# Patient Record
Sex: Male | Born: 1960 | Race: White | Hispanic: No | Marital: Married | Smoking: Former smoker
Health system: Southern US, Community
[De-identification: ages and names within clinical notes are randomized; demographics above are authoritative.]

## PROBLEM LIST (undated history)

## (undated) DIAGNOSIS — I10 Essential (primary) hypertension: Secondary | ICD-10-CM

## (undated) DIAGNOSIS — M1990 Unspecified osteoarthritis, unspecified site: Secondary | ICD-10-CM

## (undated) DIAGNOSIS — S5291XA Unspecified fracture of right forearm, initial encounter for closed fracture: Secondary | ICD-10-CM

## (undated) DIAGNOSIS — S82892A Other fracture of left lower leg, initial encounter for closed fracture: Secondary | ICD-10-CM

## (undated) DIAGNOSIS — S82891A Other fracture of right lower leg, initial encounter for closed fracture: Secondary | ICD-10-CM

## (undated) DIAGNOSIS — R011 Cardiac murmur, unspecified: Secondary | ICD-10-CM

## (undated) DIAGNOSIS — Z720 Tobacco use: Secondary | ICD-10-CM

## (undated) DIAGNOSIS — I2510 Atherosclerotic heart disease of native coronary artery without angina pectoris: Secondary | ICD-10-CM

## (undated) DIAGNOSIS — E785 Hyperlipidemia, unspecified: Secondary | ICD-10-CM

## (undated) HISTORY — DX: Other fracture of left lower leg, initial encounter for closed fracture: S82.892A

## (undated) HISTORY — DX: Other fracture of right lower leg, initial encounter for closed fracture: S82.891A

## (undated) HISTORY — PX: CARDIAC CATHETERIZATION: SHX172

## (undated) HISTORY — DX: Hyperlipidemia, unspecified: E78.5

## (undated) HISTORY — DX: Tobacco use: Z72.0

## (undated) HISTORY — DX: Cardiac murmur, unspecified: R01.1

## (undated) HISTORY — DX: Essential (primary) hypertension: I10

## (undated) HISTORY — DX: Unspecified osteoarthritis, unspecified site: M19.90

## (undated) HISTORY — DX: Unspecified fracture of right forearm, initial encounter for closed fracture: S52.91XA

## (undated) HISTORY — DX: Atherosclerotic heart disease of native coronary artery without angina pectoris: I25.10

---

## 2009-03-13 ENCOUNTER — Ambulatory Visit: Payer: Self-pay | Admitting: Family Medicine

## 2009-03-13 DIAGNOSIS — F17200 Nicotine dependence, unspecified, uncomplicated: Secondary | ICD-10-CM | POA: Insufficient documentation

## 2009-03-14 DIAGNOSIS — I10 Essential (primary) hypertension: Secondary | ICD-10-CM | POA: Insufficient documentation

## 2009-03-14 DIAGNOSIS — M1990 Unspecified osteoarthritis, unspecified site: Secondary | ICD-10-CM | POA: Insufficient documentation

## 2009-03-16 LAB — CONVERTED CEMR LAB
Albumin: 3.8 g/dL (ref 3.5–5.2)
Basophils Absolute: 0 10*3/uL (ref 0.0–0.1)
CO2: 29 meq/L (ref 19–32)
Calcium: 9.1 mg/dL (ref 8.4–10.5)
Cholesterol: 236 mg/dL — ABNORMAL HIGH (ref 0–200)
Direct LDL: 176 mg/dL
Eosinophils Absolute: 0.2 10*3/uL (ref 0.0–0.7)
Glucose, Bld: 173 mg/dL — ABNORMAL HIGH (ref 70–99)
HCT: 48.3 % (ref 39.0–52.0)
Hemoglobin: 16.5 g/dL (ref 13.0–17.0)
Lymphocytes Relative: 35 % (ref 12.0–46.0)
Lymphs Abs: 3.5 10*3/uL (ref 0.7–4.0)
MCHC: 34.1 g/dL (ref 30.0–36.0)
Neutro Abs: 5.5 10*3/uL (ref 1.4–7.7)
PSA: 0.25 ng/mL (ref 0.10–4.00)
RDW: 12 % (ref 11.5–14.6)
Sodium: 139 meq/L (ref 135–145)
Triglycerides: 208 mg/dL — ABNORMAL HIGH (ref 0.0–149.0)

## 2009-04-10 ENCOUNTER — Ambulatory Visit: Payer: Self-pay | Admitting: Family Medicine

## 2009-04-10 DIAGNOSIS — E1169 Type 2 diabetes mellitus with other specified complication: Secondary | ICD-10-CM | POA: Insufficient documentation

## 2009-04-10 DIAGNOSIS — E785 Hyperlipidemia, unspecified: Secondary | ICD-10-CM

## 2009-04-10 LAB — CONVERTED CEMR LAB
BUN: 10 mg/dL (ref 6–23)
Calcium: 9.2 mg/dL (ref 8.4–10.5)
Cholesterol, target level: 200 mg/dL
GFR calc non Af Amer: 95.33 mL/min (ref >60)
Glucose, Bld: 165 mg/dL — ABNORMAL HIGH (ref 70–99)
LDL Goal: 130 mg/dL

## 2009-04-22 ENCOUNTER — Ambulatory Visit: Payer: Self-pay | Admitting: Family Medicine

## 2009-04-22 DIAGNOSIS — E119 Type 2 diabetes mellitus without complications: Secondary | ICD-10-CM | POA: Insufficient documentation

## 2009-05-25 ENCOUNTER — Ambulatory Visit: Payer: Self-pay | Admitting: Family Medicine

## 2009-06-10 ENCOUNTER — Encounter: Payer: Self-pay | Admitting: Family Medicine

## 2009-08-21 ENCOUNTER — Ambulatory Visit: Payer: Self-pay | Admitting: Family Medicine

## 2009-08-23 LAB — CONVERTED CEMR LAB
Albumin: 3.5 g/dL (ref 3.5–5.2)
Alkaline Phosphatase: 68 units/L (ref 39–117)
Bilirubin, Direct: 0.1 mg/dL (ref 0.0–0.3)
LDL Cholesterol: 107 mg/dL — ABNORMAL HIGH (ref 0–99)
Total CHOL/HDL Ratio: 4

## 2009-08-26 ENCOUNTER — Ambulatory Visit: Payer: Self-pay | Admitting: Family Medicine

## 2010-03-22 ENCOUNTER — Ambulatory Visit: Payer: Self-pay | Admitting: Family Medicine

## 2010-03-23 LAB — CONVERTED CEMR LAB
ALT: 28 units/L (ref 0–53)
AST: 27 units/L (ref 0–37)
Alkaline Phosphatase: 69 units/L (ref 39–117)
BUN: 14 mg/dL (ref 6–23)
Chloride: 97 meq/L (ref 96–112)
Cholesterol: 206 mg/dL — ABNORMAL HIGH (ref 0–200)
Creatinine,U: 64.6 mg/dL
Glucose, Bld: 114 mg/dL — ABNORMAL HIGH (ref 70–99)
Microalb Creat Ratio: 1.5 mg/g (ref 0.0–30.0)
Microalb, Ur: 1 mg/dL (ref 0.0–1.9)
Potassium: 4.9 meq/L (ref 3.5–5.1)
Sodium: 139 meq/L (ref 135–145)
Total Bilirubin: 0.4 mg/dL (ref 0.3–1.2)
Total CHOL/HDL Ratio: 4

## 2010-04-01 ENCOUNTER — Ambulatory Visit: Payer: Self-pay | Admitting: Family Medicine

## 2010-09-14 NOTE — Assessment & Plan Note (Signed)
Summary: ROA 3 [facility] F/U ON LABS CYD   Vital Signs:  Patient profile:   50 year old male Height:      72 inches Weight:      215.0 pounds BMI:     29.26 Temp:     98.0 degrees F oral Pulse rate:   70 / minute Pulse rhythm:   regular BP sitting:   120 / 80  (left arm) Cuff size:   regular  Vitals Entered By: Benny Lennert CMA Duncan Dull) (August 26, 2009 3:33 PM)  History of Present Illness: Chief complaint 3 month follow up  DM: sugars improved, compliant with diet  HTN: stable, at goal  Lipids: improved on zocor, has not been eating that great with regards to fat and no exercise.  Smoker, wants to quit tobacco  Hypertension History:      He denies headache, chest pain, palpitations, dyspnea with exertion, orthopnea, PND, peripheral edema, visual symptoms, neurologic problems, syncope, and side effects from treatment.  He notes no problems with any antihypertensive medication side effects.        Positive major cardiovascular risk factors include male age 53 years old or older, diabetes, hyperlipidemia, hypertension, and current tobacco user.        Further assessment for target organ damage reveals no history of ASHD, cardiac end-organ damage (CHF/LVH), stroke/TIA, peripheral vascular disease, renal insufficiency, or hypertensive retinopathy.    Lipid Management History:      Positive NCEP/ATP III risk factors include male age 51 years old or older, diabetes, current tobacco user, and hypertension.  Negative NCEP/ATP III risk factors include no ASHD (atherosclerotic heart disease), no prior stroke/TIA, no peripheral vascular disease, and no history of aortic aneurysm.        The patient states that he knows about the "Therapeutic Lifestyle Change" diet.  His compliance with the TLC diet is good.  The patient expresses understanding of adjunctive measures for cholesterol lowering.  Adjunctive measures started by the patient include aerobic exercise, fiber, ASA, omega-3 supplements,  limit alcohol consumpton, and weight reduction.  He expresses no side effects from his lipid-lowering medication.    Clinical Review Panels:  Prevention   Last PSA:  0.25 (03/13/2009)  Lipid Management   Cholesterol:  171 (08/21/2009)   LDL (bad choesterol):  107 (08/21/2009)   HDL (good cholesterol):  41.60 (08/21/2009)  Diabetes Management   HgBA1C:  6.8 (08/21/2009)   Creatinine:  0.9 (04/10/2009)  CBC   WBC:  10.0 (03/13/2009)   RBC:  5.29 (03/13/2009)   Hgb:  16.5 (03/13/2009)   Hct:  48.3 (03/13/2009)   Platelets:  139.0 (03/13/2009)   MCV  91.2 (03/13/2009)   MCHC  34.1 (03/13/2009)   RDW  12.0 (03/13/2009)   PMN:  54.9 (03/13/2009)   Lymphs:  35.0 (03/13/2009)   Monos:  8.1 (03/13/2009)   Eosinophils:  1.6 (03/13/2009)   Basophil:  0.4 (03/13/2009)  Complete Metabolic Panel   Glucose:  165 (04/10/2009)   Sodium:  138 (04/10/2009)   Potassium:  4.3 (04/10/2009)   Chloride:  102 (04/10/2009)   CO2:  32 (04/10/2009)   BUN:  10 (04/10/2009)   Creatinine:  0.9 (04/10/2009)   Albumin:  3.5 (08/21/2009)   Total Protein:  7.9 (08/21/2009)   Calcium:  9.2 (04/10/2009)   Total Bili:  0.8 (08/21/2009)   Alk Phos:  68 (08/21/2009)   SGPT (ALT):  35 (08/21/2009)   SGOT (AST):  30 (08/21/2009)   Allergies (verified): No Known Drug Allergies  Past History:  Past medical, surgical, family and social histories (including risk factors) reviewed, and no changes noted (except as noted below).  Past Medical History: Reviewed history from 04/22/2009 and no changes required. Hypertension Osteoarthritis Tobacco abuse Hyperlipidemia Diabetes mellitus, type II  Past Surgical History: Reviewed history from 03/13/2009 and no changes required. none  Family History: Reviewed history from 03/13/2009 and no changes required. Adopted  Social History: Reviewed history from 03/13/2009 and no changes required. Occupation: Occupational hygienist, Merchandiser, retail Married Current  Smoker Alcohol use-yes Drug use-no Regular exercise-no  Review of Systems       ROS: GEN: No acute illnesses, no fevers, chills, sweats, fatigue, weight loss, or URI sx. GI: No n/v/d Pulm: No SOB, cough, wheezing Interactive and getting along well at home.  Otherwise, ROS is as per the HPI.   Physical Exam  Additional Exam:  GEN: WDWN, NAD, Non-toxic, A & O x 3 HEENT: Atraumatic, Normocephalic. Neck supple. No masses, No LAD. Ears and Nose: No external deformity. CV: RRR, No M/G/R. No JVD. No thrill. No extra heart sounds. PULM: CTA B, no wheezes, crackles, rhonchi. No retractions. No resp. distress. No accessory muscle use. EXTR: No c/c/e NEURO: Normal gait.  PSYCH: Normally interactive. Conversant. Not depressed or anxious appearing.  Calm demeanor.     Impression & Recommendations:  Problem # 1:  DIABETES MELLITUS, TYPE II (ICD-250.00) Assessment Improved  His updated medication list for this problem includes:    Lisinopril-hydrochlorothiazide 20-12.5 Mg Tabs (Lisinopril-hydrochlorothiazide) .Marland Kitchen... 1 tablet by mouth daily    Glucophage Xr 500 Mg Xr24h-tab (Metformin hcl) .Marland Kitchen... 1 daily by mouth  Labs Reviewed: Creat: 0.9 (04/10/2009)    Reviewed HgBA1c results: 6.8 (08/21/2009)  7.4 (04/10/2009)  Problem # 2:  HYPERLIPIDEMIA (ICD-272.4) Assessment: Improved  improved, not at goal will work a great deal on losing weight, diet, exercise, recheck 6 months  His updated medication list for this problem includes:    Simvastatin 40 Mg Tabs (Simvastatin) .Marland Kitchen... 1 by mouth at bedtime  Labs Reviewed: SGOT: 30 (08/21/2009)   SGPT: 35 (08/21/2009)  Lipid Goals: Chol Goal: 200 (04/10/2009)   HDL Goal: 40 (04/10/2009)   LDL Goal: 100 (04/22/2009)   TG Goal: 150 (04/10/2009)  10 Yr Risk Heart Disease: 18 % Prior 10 Yr Risk Heart Disease: 27 % (04/22/2009)   HDL:41.60 (08/21/2009), 34.60 (03/13/2009)  LDL:107 (08/21/2009), 176 (03/13/2009)  Chol:171 (08/21/2009), 236  (03/13/2009)  Trig:114.0 (08/21/2009), 208.0 (03/13/2009)  Problem # 3:  HYPERTENSION (ICD-401.9) Assessment: Improved  His updated medication list for this problem includes:    Lisinopril-hydrochlorothiazide 20-12.5 Mg Tabs (Lisinopril-hydrochlorothiazide) .Marland Kitchen... 1 tablet by mouth daily  BP today: 120/80 Prior BP: 118/70 (05/25/2009)  10 Yr Risk Heart Disease: 18 % Prior 10 Yr Risk Heart Disease: 27 % (04/22/2009)  Labs Reviewed: K+: 4.3 (04/10/2009) Creat: : 0.9 (04/10/2009)   Chol: 171 (08/21/2009)   HDL: 41.60 (08/21/2009)   LDL: 107 (08/21/2009)   TG: 114.0 (08/21/2009)  Problem # 4:  TOBACCO USE (ICD-305.1)  The patient does smoke cigarettes, and we discussed that tobacco is harmful to one's overall health, and that likely quitting smoking would be the single best thing that they could do for their health.   His updated medication list for this problem includes:    Chantix Starting Month Pak 0.5 Mg X 11 & 1 Mg X 42 Misc (Varenicline tartrate) .Marland Kitchen... 0.5mg  by mouth once daily for 3 days, then twice daily for 4 days and then 1mg  by mouth 2 times daily  Chantix 1 Mg Tabs (Varenicline tartrate) .Marland Kitchen... 1 tab by mouth 2 times daily  Encouraged smoking cessation and discussed different methods for smoking cessation.   Orders: Tobacco use cessation intermediate 3-10 minutes (99406)  Complete Medication List: 1)  Prilosec Otc 20 Mg Tbec (Omeprazole magnesium) .... Once a day 2)  Lisinopril-hydrochlorothiazide 20-12.5 Mg Tabs (Lisinopril-hydrochlorothiazide) .Marland Kitchen.. 1 tablet by mouth daily 3)  Simvastatin 40 Mg Tabs (Simvastatin) .Marland Kitchen.. 1 by mouth at bedtime 4)  Glucophage Xr 500 Mg Xr24h-tab (Metformin hcl) .Marland Kitchen.. 1 daily by mouth 5)  Lancets For Blood Glucose Machine, 250.00  .... Check bs two times a day 6)  Strips For Blood Glucose Machine, 250.00  .... Check blood sugar two times a day 7)  Chantix Starting Month Pak 0.5 Mg X 11 & 1 Mg X 42 Misc (Varenicline tartrate) .... 0.5mg  by  mouth once daily for 3 days, then twice daily for 4 days and then 1mg  by mouth 2 times daily 8)  Chantix 1 Mg Tabs (Varenicline tartrate) .Marland Kitchen.. 1 tab by mouth 2 times daily  Hypertension Assessment/Plan:      The patient's hypertensive risk group is category C: Target organ damage and/or diabetes.  His calculated 10 year risk of coronary heart disease is 18 %.  Today's blood pressure is 120/80.  His blood pressure goal is < 130/80.  Lipid Assessment/Plan:      The patient's calculated 10 year coronary heart disease risk is 18 %.  Based on NCEP/ATP III, the patient's risk factor category is "history of diabetes".  From this information, the patient's calculated lipid goals are as follows: Total cholesterol goal is 200; LDL cholesterol goal is 100; HDL cholesterol goal is 40; Triglyceride goal is 150.  His LDL cholesterol goal has not been met.    Patient Instructions: 1)  f/u 6 months 2)  Prior to next visit: 3)  BMP prior to visit, ICD-9: 250.00 4)  Hepatic Panel prior to visit ICD-9: 250.00 5)  HgbA1c: 250.00 6)  Lipid panel prior to visit ICD-9 : 250.00 7)  Urine Microalbumin prior to visit ICD-9 : 250.00 Prescriptions: GLUCOPHAGE XR 500 MG XR24H-TAB (METFORMIN HCL) 1 daily by mouth  #90 x 3   Entered by:   Benny Lennert CMA (AAMA)   Authorized by:   Hannah Beat MD   Signed by:   Benny Lennert CMA (AAMA) on 08/26/2009   Method used:   Electronically to        [facility]  [address] [phone]* (retail)       [address]       [place], [place]  [address]       Ph: [phone]       Fax: [phone]   RxID:   [id number] SIMVASTATIN 40 MG TABS (SIMVASTATIN) 1 by mouth at bedtime  #90 x 3   Entered by:   Benny Lennert CMA (AAMA)   Authorized by:   Hannah Beat MD   Signed by:   Benny Lennert CMA (AAMA) on 08/26/2009   Method used:   Electronically to        [facility]  [address] [phone] (retail)       [address]       [place], [place]  [address]       Ph: [phone]       Fax: [phone]   RxID:   [id number] LISINOPRIL-HYDROCHLOROTHIAZIDE  20-12.5 MG TABS (LISINOPRIL-HYDROCHLOROTHIAZIDE) 1 tablet by mouth daily  #90 x 3   Entered by:   Benny Lennert CMA (AAMA)   Authorized by:   Hannah Beat MD   Signed by:   Benny Lennert CMA (AAMA) on 08/26/2009   Method used:   Electronically to        [facility]  [address] [phone]* (retail)       [address]       [place], [place]  [address]       Ph: [phone]       Fax: [phone]   RxID:   [id number] CHANTIX 1 MG TABS (VARENICLINE TARTRATE) 1 tab by mouth 2 times daily  #60 x 3   Entered and Authorized by:   Hannah Beat MD   Signed by:   Hannah Beat MD on 08/26/2009   Method used:   Print then Give to Patient   RxID:   [id number] CHANTIX STARTING MONTH PAK 0.5 MG X 11 & 1 MG X 42  MISC (VARENICLINE TARTRATE) 0.5mg  by mouth once daily for 3 days, then twice daily for 4 days and then 1mg  by mouth 2 times daily  #1 pack x 0   Entered and Authorized by:   Hannah Beat MD   Signed by:   Hannah Beat MD on 08/26/2009   Method used:   Print then Give to Patient   RxID:   [id number]   Current Allergies (reviewed today): No known allergies

## 2010-09-14 NOTE — Assessment & Plan Note (Signed)
Summary: 6 month follow up/rbh   Vital Signs:  Patient profile:   50 year old male Height:      72 inches Weight:      208.0 pounds BMI:     28.31 Temp:     97.7 degrees F oral Pulse rate:   70 / minute Pulse rhythm:   regular BP sitting:   112 / 80  (left arm) Cuff size:   regular  Vitals Entered By: Benny Lennert CMA Duncan Dull) (April 01, 2010 9:18 AM)  History of Present Illness: Chief complaint 6 month follow up  DM: blood sugars are doing great, hemoglobin A1c is 6.1  HTN: at goal, compliant with medications  Lipids: not at goal, mostly compliant, 80-90 % taking Zocor  tobacco: Continues to smoke   REVIEW OF SYSTEMS GEN: No acute illnesses, no fever, chills, sweats. CV: No chest pain or SOB GI: No noted N or V Otherwise, pertinent positives and negatives are noted in the HPI.   GEN: WDWN, NAD, Non-toxic, A & O x 3 HEENT: Atraumatic, Normocephalic. Neck supple. No masses, No LAD. Ears and Nose: No external deformity. CV: RRR, No M/G/R. No JVD. No thrill. No extra heart sounds. PULM: CTA B, no wheezes, crackles, rhonchi. No retractions. No resp. distress. No accessory muscle use. EXTR: No c/c/e NEURO: Normal gait.  PSYCH: Normally interactive. Conversant. Not depressed or anxious appearing.  Calm demeanor.    Allergies (verified): No Known Drug Allergies  Past History:  Past medical, surgical, family and social histories (including risk factors) reviewed, and no changes noted (except as noted below).  Past Medical History: Reviewed history from 04/22/2009 and no changes required. Hypertension Osteoarthritis Tobacco abuse Hyperlipidemia Diabetes mellitus, type II  Past Surgical History: Reviewed history from 03/13/2009 and no changes required. none  Family History: Reviewed history from 03/13/2009 and no changes required. Adopted  Social History: Reviewed history from 03/13/2009 and no changes required. Occupation: Occupational hygienist,  Merchandiser, retail Married Current Smoker Alcohol use-yes Drug use-no Regular exercise-no   Impression & Recommendations:  Problem # 1:  HYPERLIPIDEMIA (ICD-272.4) Assessment Deteriorated change to lipitor 80  His updated medication list for this problem includes:    Lipitor 80 Mg Tabs (Atorvastatin calcium) .Marland Kitchen... 1 by mouth at bedtime  Labs Reviewed: SGOT: 27 (03/22/2010)   SGPT: 28 (03/22/2010)  Lipid Goals: Chol Goal: 200 (04/10/2009)   HDL Goal: 40 (04/10/2009)   LDL Goal: 100 (04/22/2009)   TG Goal: 150 (04/10/2009)  Prior 10 Yr Risk Heart Disease: 18 % (08/26/2009)   HDL:48.40 (03/22/2010), 41.60 (08/21/2009)  LDL:107 (08/21/2009), 176 (03/13/2009)  Chol:206 (03/22/2010), 171 (08/21/2009)  Trig:296.0 (03/22/2010), 114.0 (08/21/2009)  Problem # 2:  DIABETES MELLITUS, TYPE II (ICD-250.00) Assessment: Improved  His updated medication list for this problem includes:    Lisinopril-hydrochlorothiazide 20-12.5 Mg Tabs (Lisinopril-hydrochlorothiazide) .Marland Kitchen... 1 tablet by mouth daily    Glucophage Xr 500 Mg Xr24h-tab (Metformin hcl) .Marland Kitchen... 1 daily by mouth  Labs Reviewed: Creat: 1.0 (03/22/2010)    Reviewed HgBA1c results: 6.1 (03/22/2010)  6.8 (08/21/2009)  Problem # 3:  HYPERTENSION (ICD-401.9) Assessment: Unchanged  His updated medication list for this problem includes:    Lisinopril-hydrochlorothiazide 20-12.5 Mg Tabs (Lisinopril-hydrochlorothiazide) .Marland Kitchen... 1 tablet by mouth daily  BP today: 112/80 Prior BP: 120/80 (08/26/2009)  Prior 10 Yr Risk Heart Disease: 18 % (08/26/2009)  Labs Reviewed: K+: 4.9 (03/22/2010) Creat: : 1.0 (03/22/2010)   Chol: 206 (03/22/2010)   HDL: 48.40 (03/22/2010)   LDL: 107 (08/21/2009)   TG: 296.0 (03/22/2010)  Problem # 4:  TOBACCO USE (ICD-305.1) Assessment: Unchanged The patient does smoke cigarettes, and we discussed that tobacco is harmful to one's overall health, and that likely quitting smoking would be the single best thing that they  could do for their health.   The following medications were removed from the medication list:    Chantix Starting Month Pak 0.5 Mg X 11 & 1 Mg X 42 Misc (Varenicline tartrate) .Marland Kitchen... 0.5mg  by mouth once daily for 3 days, then twice daily for 4 days and then 1mg  by mouth 2 times daily  Complete Medication List: 1)  Prilosec Otc 20 Mg Tbec (Omeprazole magnesium) .... Once a day 2)  Lisinopril-hydrochlorothiazide 20-12.5 Mg Tabs (Lisinopril-hydrochlorothiazide) .Marland Kitchen.. 1 tablet by mouth daily 3)  Lipitor 80 Mg Tabs (Atorvastatin calcium) .Marland Kitchen.. 1 by mouth at bedtime 4)  Glucophage Xr 500 Mg Xr24h-tab (Metformin hcl) .Marland Kitchen.. 1 daily by mouth 5)  Lancets For Blood Glucose Machine, 250.00  .... Check bs two times a day 6)  Strips For Blood Glucose Machine, 250.00  .... Check blood sugar two times a day  Patient Instructions: 1)  f/u 6 months, full CPX (30 min) 2)  BMP prior to visit, ICD-9: 250.00, 272.4, v58.69, v76.44 3)  Hepatic Panel prior to visit ICD-9:  4)  Lipid panel prior to visit ICD-9 :  5)  CBC w/ Diff prior to visit ICD-9 :  6)  PSA prior to visit ICD-9:  7)  HgBA1c prior to visit  ICD-9:  Prescriptions: LIPITOR 80 MG TABS (ATORVASTATIN CALCIUM) 1 by mouth at bedtime  #90 x 3   Entered and Authorized by:   Hannah Beat MD   Signed by:   Hannah Beat MD on 04/01/2010   Method used:   Print then Give to Patient   RxID:   [id number]   Current Allergies (reviewed today): No known allergies

## 2010-10-06 ENCOUNTER — Encounter: Payer: Self-pay | Admitting: Family Medicine

## 2010-10-08 ENCOUNTER — Other Ambulatory Visit (INDEPENDENT_AMBULATORY_CARE_PROVIDER_SITE_OTHER): Payer: Managed Care, Other (non HMO)

## 2010-10-08 ENCOUNTER — Encounter (INDEPENDENT_AMBULATORY_CARE_PROVIDER_SITE_OTHER): Payer: Self-pay | Admitting: *Deleted

## 2010-10-08 ENCOUNTER — Other Ambulatory Visit: Payer: Self-pay | Admitting: Family Medicine

## 2010-10-08 DIAGNOSIS — R5381 Other malaise: Secondary | ICD-10-CM

## 2010-10-08 DIAGNOSIS — E785 Hyperlipidemia, unspecified: Secondary | ICD-10-CM

## 2010-10-08 DIAGNOSIS — E119 Type 2 diabetes mellitus without complications: Secondary | ICD-10-CM

## 2010-10-08 DIAGNOSIS — I10 Essential (primary) hypertension: Secondary | ICD-10-CM

## 2010-10-08 DIAGNOSIS — Z125 Encounter for screening for malignant neoplasm of prostate: Secondary | ICD-10-CM

## 2010-10-08 DIAGNOSIS — R5383 Other fatigue: Secondary | ICD-10-CM

## 2010-10-08 LAB — LIPID PANEL
LDL Cholesterol: 80 mg/dL (ref 0–99)
Total CHOL/HDL Ratio: 4
Triglycerides: 133 mg/dL (ref 0.0–149.0)

## 2010-10-08 LAB — CBC WITH DIFFERENTIAL/PLATELET
Basophils Relative: 0.3 % (ref 0.0–3.0)
Eosinophils Absolute: 0.1 10*3/uL (ref 0.0–0.7)
HCT: 45 % (ref 39.0–52.0)
Hemoglobin: 15.3 g/dL (ref 13.0–17.0)
Lymphs Abs: 3.8 10*3/uL (ref 0.7–4.0)
MCHC: 33.9 g/dL (ref 30.0–36.0)
MCV: 86.7 fl (ref 78.0–100.0)
Monocytes Absolute: 0.7 10*3/uL (ref 0.1–1.0)
Neutro Abs: 5.8 10*3/uL (ref 1.4–7.7)
RBC: 5.2 Mil/uL (ref 4.22–5.81)
WBC: 10.5 10*3/uL (ref 4.5–10.5)

## 2010-10-08 LAB — HEPATIC FUNCTION PANEL
ALT: 24 U/L (ref 0–53)
AST: 17 U/L (ref 0–37)
Alkaline Phosphatase: 74 U/L (ref 39–117)
Bilirubin, Direct: 0.1 mg/dL (ref 0.0–0.3)
Total Protein: 7.5 g/dL (ref 6.0–8.3)

## 2010-10-08 LAB — BASIC METABOLIC PANEL
BUN: 13 mg/dL (ref 6–23)
Calcium: 9.4 mg/dL (ref 8.4–10.5)
Creatinine, Ser: 0.9 mg/dL (ref 0.4–1.5)
GFR: 98.53 mL/min (ref >60.00)
Glucose, Bld: 137 mg/dL — ABNORMAL HIGH (ref 70–99)
Potassium: 4.5 mEq/L (ref 3.5–5.1)

## 2010-10-15 ENCOUNTER — Other Ambulatory Visit: Payer: Self-pay

## 2010-10-18 ENCOUNTER — Encounter (INDEPENDENT_AMBULATORY_CARE_PROVIDER_SITE_OTHER): Payer: Managed Care, Other (non HMO) | Admitting: Family Medicine

## 2010-10-18 ENCOUNTER — Encounter: Payer: Self-pay | Admitting: Family Medicine

## 2010-10-18 DIAGNOSIS — F17200 Nicotine dependence, unspecified, uncomplicated: Secondary | ICD-10-CM

## 2010-10-18 DIAGNOSIS — Z0000 Encounter for general adult medical examination without abnormal findings: Secondary | ICD-10-CM

## 2010-10-18 DIAGNOSIS — E785 Hyperlipidemia, unspecified: Secondary | ICD-10-CM

## 2010-10-18 DIAGNOSIS — E119 Type 2 diabetes mellitus without complications: Secondary | ICD-10-CM

## 2010-10-18 DIAGNOSIS — Z23 Encounter for immunization: Secondary | ICD-10-CM

## 2010-10-18 DIAGNOSIS — I10 Essential (primary) hypertension: Secondary | ICD-10-CM

## 2010-10-26 NOTE — Assessment & Plan Note (Signed)
Summary: cpe DLO r/s from 09/29/10   Vital Signs:  Patient profile:   50 year old male Height:      72 inches Weight:      209.50 pounds BMI:     28.52 Temp:     98.5 degrees F oral Pulse rate:   70 / minute Pulse rhythm:   regular BP sitting:   130 / 80  (left arm) Cuff size:   regular  Vitals Entered By: Linde Gillis CMA Duncan Dull) (October 18, 2010 2:37 PM) CC: cpx   History of Present Illness: 50 year old male:  Colonoscopy: he declines pneumovax  Still smoker.   DM: doing well, tol metformin, a1c at 7. Has had about a 10 pound weight gain.  Hyperlipidemia:  Lipid Panel: reviewed last resultsChol:     144 (10/08/2010 9:05:06 AM)HDL:     37.70 (10/08/2010 9:05:06 AM)LDL:      80 (10/08/2010 9:05:06 AM)TG:       \par Much improved on Lipitor 80. No SE.  HTN: stable and at goal, no med SE. no HA.  Tobacco: still smoking cigarettes, did well on 1 month of chantix, but wife did not get him a refill, and he then continued to smoke. He is ready to quit and wants to.  Contraindications/Deferment of Procedures/Staging:    Test/Procedure: Colonoscopy    Reason for deferment: patient declined   Preventive Screening-Counseling & Management  Alcohol-Tobacco     Alcohol drinks/day: 1     Alcohol Counseling: not indicated; use of alcohol is not excessive or problematic     Smoking Status: current     Smoking Cessation Counseling: yes     Smoke Cessation Stage: ready     Packs/Day: 1.0     Tobacco Counseling: to quit use of tobacco products  Caffeine-Diet-Exercise     Diet Comments: fairly good     Diet Counseling: to improve diet; diet is suboptimal     Does Patient Exercise: no     Times/week: <3     Exercise Counseling: to improve exercise regimen  Hep-HIV-STD-Contraception     Hepatitis Risk: no risk noted     HIV Risk: no risk noted     STD Risk: no risk noted     STD Risk Counseling: not indicated-no STD risk noted     Contraception Counseling: not indicated; no  questions/concerns expressed     Dental Care Counseling: to seek dental care; no dental care within six months     Testicular SE Education/Counseling to perform regular STE     Sun Exposure-Excessive: yes  Safety-Violence-Falls     Fall Risk Counseling: not indicated; no significant falls noted      Sexual History:  currently monogamous.        Drug Use:  never.    Clinical Review Panels:  Prevention   Last PSA:  0.18 (10/08/2010)  Immunizations   Last Tetanus Booster:  Tdap (10/18/2010)   Last Pneumovax:  Pneumovax (10/18/2010)  Lipid Management   Cholesterol:  144 (10/08/2010)   LDL (bad choesterol):  80 (10/08/2010)   HDL (good cholesterol):  37.70 (10/08/2010)  Diabetes Management   HgBA1C:  7.0 (10/08/2010)   Creatinine:  0.9 (10/08/2010)   Last Pneumovax:  Pneumovax (10/18/2010)  CBC   WBC:  10.5 (10/08/2010)   RBC:  5.20 (10/08/2010)   Hgb:  15.3 (10/08/2010)   Hct:  45.0 (10/08/2010)   Platelets:  169.0 (10/08/2010)   MCV  86.7 (10/08/2010)   MCHC  33.9 (10/08/2010)   RDW  13.0 (10/08/2010)   PMN:  55.5 (10/08/2010)   Lymphs:  36.2 (10/08/2010)   Monos:  6.8 (10/08/2010)   Eosinophils:  1.2 (10/08/2010)   Basophil:  0.3 (10/08/2010)  Complete Metabolic Panel   Glucose:  137 (10/08/2010)   Sodium:  137 (10/08/2010)   Potassium:  4.5 (10/08/2010)   Chloride:  99 (10/08/2010)   CO2:  29 (10/08/2010)   BUN:  13 (10/08/2010)   Creatinine:  0.9 (10/08/2010)   Albumin:  4.0 (10/08/2010)   Total Protein:  7.5 (10/08/2010)   Calcium:  9.4 (10/08/2010)   Total Bili:  0.6 (10/08/2010)   Alk Phos:  74 (10/08/2010)   SGPT (ALT):  24 (10/08/2010)   SGOT (AST):  17 (10/08/2010)   Allergies (verified): No Known Drug Allergies  Past History:  Past medical, surgical, family and social histories (including risk factors) reviewed, and no changes noted (except as noted below).  Past Medical History: Reviewed history from 04/22/2009 and no changes  required. Hypertension Osteoarthritis Tobacco abuse Hyperlipidemia Diabetes mellitus, type II  Past Surgical History: Reviewed history from 03/13/2009 and no changes required. none  Family History: Reviewed history from 03/13/2009 and no changes required. Adopted  Social History: Reviewed history from 03/13/2009 and no changes required. Occupation: Occupational hygienist, Merchandiser, retail Married Current Smoker Alcohol use-yes Drug use-no Regular exercise-no  Review of Systems  The patient denies anorexia, fever, weight loss, weight gain, vision loss, decreased hearing, hoarseness, chest pain, syncope, dyspnea on exertion, peripheral edema, prolonged cough, headaches, hemoptysis, abdominal pain, melena, hematochezia, severe indigestion/heartburn, hematuria, incontinence, genital sores, muscle weakness, suspicious skin lesions, transient blindness, difficulty walking, depression, unusual weight change, abnormal bleeding, enlarged lymph nodes, angioedema, breast masses, and testicular masses.     Impression & Recommendations:  Problem # 1:  HEALTH MAINTENANCE EXAM (ICD-V70.0) The patient's preventative maintenance and recommended screening tests for an annual wellness exam were reviewed in full today. Brought up to date unless services declined.  Counselled on the importance of diet, exercise, and its role in overall health and mortality. The patient's FH and SH was reviewed, including their home life, tobacco status, and drug and alcohol status.   work on losing weight improve diet and exercise  Problem # 2:  DIABETES MELLITUS, TYPE II (ICD-250.00) Assessment: Improved  His updated medication list for this problem includes:    Lisinopril-hydrochlorothiazide 20-12.5 Mg Tabs (Lisinopril-hydrochlorothiazide) .Marland Kitchen... 1 tablet by mouth daily    Glucophage Xr 500 Mg Xr24h-tab (Metformin hcl) .Marland Kitchen... 1 daily by mouth  Labs Reviewed: Creat: 0.9 (10/08/2010)    Reviewed HgBA1c results: 7.0  (10/08/2010)  6.1 (03/22/2010)  Problem # 3:  HYPERLIPIDEMIA (ICD-272.4) Assessment: Improved  His updated medication list for this problem includes:    Atorvastatin Calcium 80 Mg Tabs (Atorvastatin calcium) .Marland Kitchen... 1 by mouth at bedtime  Labs Reviewed: SGOT: 17 (10/08/2010)   SGPT: 24 (10/08/2010)  Lipid Goals: Chol Goal: 200 (04/10/2009)   HDL Goal: 40 (04/10/2009)   LDL Goal: 100 (04/22/2009)   TG Goal: 150 (04/10/2009)  Prior 10 Yr Risk Heart Disease: 18 % (08/26/2009)   HDL:37.70 (10/08/2010), 48.40 (03/22/2010)  LDL:80 (10/08/2010), 107 (04/54/0981)  Chol:144 (10/08/2010), 206 (03/22/2010)  Trig:133.0 (10/08/2010), 296.0 (03/22/2010)  Problem # 4:  HYPERTENSION (ICD-401.9) Assessment: Improved  His updated medication list for this problem includes:    Lisinopril-hydrochlorothiazide 20-12.5 Mg Tabs (Lisinopril-hydrochlorothiazide) .Marland Kitchen... 1 tablet by mouth daily  BP today: 130/80 Prior BP: 112/80 (04/01/2010)  Prior 10 Yr Risk Heart Disease: 18 % (  08/26/2009)  Labs Reviewed: K+: 4.5 (10/08/2010) Creat: : 0.9 (10/08/2010)   Chol: 144 (10/08/2010)   HDL: 37.70 (10/08/2010)   LDL: 80 (10/08/2010)   TG: 133.0 (10/08/2010)  Problem # 5:  TOBACCO USE (ICD-305.1) Assessment: Deteriorated  The patient does smoke cigarettes, and we discussed that tobacco is harmful to one's overall health, and that likely quitting smoking would be the single best thing that they could do for their health.   His updated medication list for this problem includes:    Chantix Starting Month Pak 0.5 Mg X 11 & 1 Mg X 42 Misc (Varenicline tartrate) .Marland Kitchen... 0.5mg  by mouth once daily for 3 days, then twice daily for 4 days and then 1mg  by mouth 2 times daily    Chantix 1 Mg Tabs (Varenicline tartrate) .Marland Kitchen... 1 tab by mouth 2 times daily  Orders: Tobacco use cessation intermediate 3-10 minutes (99406)  Complete Medication List: 1)  Prilosec Otc 20 Mg Tbec (Omeprazole magnesium) .... Once a day 2)   Lisinopril-hydrochlorothiazide 20-12.5 Mg Tabs (Lisinopril-hydrochlorothiazide) .Marland Kitchen.. 1 tablet by mouth daily 3)  Atorvastatin Calcium 80 Mg Tabs (Atorvastatin calcium) .Marland Kitchen.. 1 by mouth at bedtime 4)  Glucophage Xr 500 Mg Xr24h-tab (Metformin hcl) .Marland Kitchen.. 1 daily by mouth 5)  Lancets For Blood Glucose Machine, 250.00  .... Check bs two times a day 6)  Strips For Blood Glucose Machine, 250.00  .... Check blood sugar two times a day 7)  Chantix Starting Month Pak 0.5 Mg X 11 & 1 Mg X 42 Misc (Varenicline tartrate) .... 0.5mg  by mouth once daily for 3 days, then twice daily for 4 days and then 1mg  by mouth 2 times daily 8)  Chantix 1 Mg Tabs (Varenicline tartrate) .Marland Kitchen.. 1 tab by mouth 2 times daily  Other Orders: Tdap => 15yrs IM (47829) Admin 1st Vaccine (56213) Pneumococcal Vaccine (08657) Admin of Any Addtl Vaccine (84696)  Patient Instructions: 1)  f/u 6 months Prescriptions: ATORVASTATIN CALCIUM 80 MG TABS (ATORVASTATIN CALCIUM) 1 by mouth at bedtime  #90 x 3   Entered and Authorized by:   Hannah Beat MD   Signed by:   Hannah Beat MD on 10/18/2010   Method used:   Print then Give to Patient   RxID:   [id number] CHANTIX 1 MG TABS (VARENICLINE TARTRATE) 1 tab by mouth 2 times daily  #60 x 3   Entered and Authorized by:   Hannah Beat MD   Signed by:   Hannah Beat MD on 10/18/2010   Method used:   Print then Give to Patient   RxID:   [id number] CHANTIX STARTING MONTH PAK 0.5 MG X 11 & 1 MG X 42  MISC (VARENICLINE TARTRATE) 0.5mg  by mouth once daily for 3 days, then twice daily for 4 days and then 1mg  by mouth 2 times daily  #1 pack x 0   Entered and Authorized by:   Hannah Beat MD   Signed by:   Hannah Beat MD on 10/18/2010   Method used:   Print then Give to Patient   RxID:   [id number]    Orders Added: 1)  Tdap => 38yrs IM [16606] 2)  Admin 1st Vaccine [90471] 3)  Pneumococcal Vaccine [30160] 4)  Admin of Any Addtl Vaccine [90472] 5)  Est.  Patient 40-64 years [99396] 6)  Est. Patient Level III [10932] 7)  Tobacco use cessation intermediate 3-10 minutes [99406]   Immunizations Administered:  Tetanus Vaccine:    Vaccine Type: Tdap    Site:  right deltoid    Mfr: GlaxoSmithKline    Dose: 0.5 ml    Route: IM    Given by: Linde Gillis CMA (AAMA)    Exp. Date: 06/03/2012    Lot #: [id number]    VIS given: 07/02/08 version given October 18, 2010.  Pneumonia Vaccine:    Vaccine Type: Pneumovax    Site: left deltoid    Mfr: Merck    Dose: 0.5 ml    Route: Lamar    Given by: Benny Lennert CMA (AAMA)    Exp. Date: 03/10/2012    Lot #: [id number]    VIS given: 07/20/09 version given October 18, 2010.   Immunizations Administered:  Tetanus Vaccine:    Vaccine Type: Tdap    Site: right deltoid    Mfr: GlaxoSmithKline    Dose: 0.5 ml    Route: IM    Given by: Linde Gillis CMA (AAMA)    Exp. Date: 06/03/2012    Lot #: [id number]    VIS given: 07/02/08 version given October 18, 2010.  Pneumonia Vaccine:    Vaccine Type: Pneumovax    Site: left deltoid    Mfr: Merck    Dose: 0.5 ml    Route: Nixon    Given by: Benny Lennert CMA (AAMA)    Exp. Date: 03/10/2012    Lot #: [id number]    VIS given: 07/20/09 version given October 18, 2010.  Prevention & Chronic Care Immunizations   Influenza vaccine: Not documented   Influenza vaccine deferral: Not available  (10/18/2010)    Tetanus booster: 10/18/2010: Tdap    Pneumococcal vaccine: Pneumovax  (10/18/2010)  Colorectal Screening   Hemoccult: Not documented   Hemoccult action/deferral: Ordered  (10/18/2010)    Colonoscopy: Not documented   Colonoscopy action/deferral: patient declined  (10/18/2010)  Other Screening   PSA: 0.18  (10/08/2010)   PSA due due: 03/13/2010   Smoking status: current  (10/18/2010)   Smoking cessation counseling: yes  (10/18/2010)  Diabetes Mellitus   HgbA1C: 7.0  (10/08/2010)   Hemoglobin A1C due: 01/06/2011    Eye exam: Not documented     Foot exam: Not documented   Foot exam action/deferral: Do today   High risk foot: No  (10/18/2010)   Foot care education: Not documented    Urine microalbumin/creatinine ratio: 1.5  (03/22/2010)   Urine microalbumin/cr due: 03/23/2011    Diabetes flowsheet reviewed?: Yes   Progress toward A1C goal: At goal  Lipids   Total Cholesterol: 144  (10/08/2010)   LDL: 80  (10/08/2010)   LDL Direct: 122.3  (03/22/2010)   HDL: 37.70  (10/08/2010)   Triglycerides: 133.0  (10/08/2010)    SGOT (AST): 17  (10/08/2010)   SGPT (ALT): 24  (10/08/2010)   Alkaline phosphatase: 74  (10/08/2010)   Total bilirubin: 0.6  (10/08/2010)    Lipid flowsheet reviewed?: Yes   Progress toward LDL goal: Improved  Hypertension   Last Blood Pressure: 130 / 80  (10/18/2010)   Serum creatinine: 0.9  (10/08/2010)   Serum potassium 4.5  (10/08/2010)    Hypertension flowsheet reviewed?: Yes   Progress toward BP goal: Improved  Self-Management Support :    Diabetes self-management support: Not documented    Hypertension self-management support: Not documented    Lipid self-management support: Not documented    Nursing Instructions: Give tetanus booster today Diabetic foot exam today    Diabetic Foot Exam Last Podiatry Exam Date: 10/18/2010  Foot Inspection Is there a history  of a foot ulcer?              No Is there a foot ulcer now?              No Can the patient see the bottom of their feet?          Yes Are the shoes appropriate in style and fit?          Yes Is there swelling or an abnormal foot shape?          No Are the toenails long?                No Are the toenails thick?                No Are the toenails ingrown?              No Is there heavy callous build-up?              No Is there pain in the calf muscle (Intermittent claudication) when walking?    NoIs there a claw toe deformity?              No Is there elevated skin temperature?            No Is there limited ankle  dorsiflexion?            No Is there foot or ankle muscle weakness?            No  Diabetic Foot Care Education Pulse Check          Right Foot          Left Foot Posterior Tibial:        normal            Dorsalis Pedis:        normal             High Risk Feet? No   Current Allergies (reviewed today): No known allergies       Physical Exam General Appearance: well developed, well nourished, no acute distress Eyes: conjunctiva and lids normal, PERRLA, EOMI Ears, Nose, Mouth, Throat: TM clear, nares clear, oral exam WNL Neck: supple, no lymphadenopathy, no thyromegaly, no JVD Respiratory: clear to auscultation and percussion, respiratory effort normal Cardiovascular: regular rate and rhythm, S1-S2, no murmur, rub or gallop, no bruits, peripheral pulses normal and symmetric, no cyanosis, clubbing, edema or varicosities Chest: no scars, masses, tenderness; no asymmetry, skin changes, nipple discharge, no gynecomastia   Gastrointestinal: soft, non-tender; no hepatosplenomegaly, masses; active bowel sounds all quadrants, no masses, tenderness, hemorrhoids  Genitourinary: no hernia, testicular mass, penile discharge, priapism or prostate enlargement Lymphatic: no cervical, axillary or inguinal adenopathy Musculoskeletal: gait normal, muscle tone and strength WNL, no joint swelling, effusions, discoloration, crepitus  Skin: clear, good turgor, color WNL, no rashes, lesions, or ulcerations Neurologic: normal mental status, normal reflexes, normal strength, sensation, and motion Psychiatric: alert; oriented to person, place and time Other Exam:

## 2010-10-26 NOTE — Letter (Signed)
Summary: [facility]: Immunoassay Fecal Occult Blood (iFOB) Order Form  [facility]  [address] [place], [place] [address]   Phone: [phone]  Fax: [phone]      [facility]: Immunoassay Fecal Occult Blood (iFOB) Order Form   October 18, 2010 MRN: [id number]   Ronald Stephens 08/15/1961   Physicican Name:_______Spencer Copland__________________  Diagnosis Code:________v76.51__________________      Ronald Beat MD

## 2010-11-22 ENCOUNTER — Other Ambulatory Visit: Payer: Managed Care, Other (non HMO)

## 2010-11-23 DIAGNOSIS — Z1211 Encounter for screening for malignant neoplasm of colon: Secondary | ICD-10-CM

## 2010-11-25 ENCOUNTER — Telehealth: Payer: Self-pay | Admitting: Radiology

## 2010-11-25 NOTE — Telephone Encounter (Signed)
[facility] called to say patient never returned his ifob kit. I spoke with the patient today, he will do it and send it in.

## 2011-04-22 ENCOUNTER — Encounter: Payer: Self-pay | Admitting: Family Medicine

## 2011-04-25 ENCOUNTER — Ambulatory Visit: Payer: Managed Care, Other (non HMO) | Admitting: Family Medicine

## 2011-04-27 ENCOUNTER — Encounter: Payer: Self-pay | Admitting: Family Medicine

## 2011-04-27 ENCOUNTER — Ambulatory Visit (INDEPENDENT_AMBULATORY_CARE_PROVIDER_SITE_OTHER): Payer: Managed Care, Other (non HMO) | Admitting: Family Medicine

## 2011-04-27 VITALS — BP 112/80 | HR 67 | Temp 98.2°F | Ht 72.0 in | Wt 222.8 lb

## 2011-04-27 DIAGNOSIS — Z23 Encounter for immunization: Secondary | ICD-10-CM

## 2011-04-27 DIAGNOSIS — I10 Essential (primary) hypertension: Secondary | ICD-10-CM

## 2011-04-27 DIAGNOSIS — M7500 Adhesive capsulitis of unspecified shoulder: Secondary | ICD-10-CM

## 2011-04-27 DIAGNOSIS — M7502 Adhesive capsulitis of left shoulder: Secondary | ICD-10-CM

## 2011-04-27 DIAGNOSIS — E119 Type 2 diabetes mellitus without complications: Secondary | ICD-10-CM

## 2011-04-27 NOTE — Progress Notes (Signed)
  Subjective:    Patient ID: Ronald Stephens, male    DOB: 04-27-1961, 50 y.o.   MRN: [id number]  HPI  Diabetes Mellitus: Tolerating Medications: Compliance with diet: good Exercise: active, but no routine exercise Avg blood sugars at home: normal Foot problems: none Hypoglycemia: none No nausea, vomitting, blurred vision, polyuria.  Lab Results  Component Value Date   HGBA1C 7.0* 10/08/2010    Basic Metabolic Panel:    Component Value Date/Time   NA 137 10/08/2010 0905   K 4.5 10/08/2010 0905   CL 99 10/08/2010 0905   CO2 29 10/08/2010 0905   BUN 13 10/08/2010 0905   CREATININE 0.9 10/08/2010 0905   GLUCOSE 137* 10/08/2010 0905   CALCIUM 9.4 10/08/2010 0905   HTN: Tolerating all medications without side effects Stable and at goal No CP, no sob. No HA.  BP Readings from Last 3 Encounters:  04/27/11 112/80  10/18/10 130/80  04/01/10 112/80    L frozen shoulder : For 5 month history of left-sided shoulder pain, greater than right-sided shoulder pain. Patient has some stiffness, and lateral shoulder pain. Pain at nighttime. Limitation of motion. Some pain with abduction, most notably the pain with internal range of motion. No trauma or injury, and this has been a slow onset.   Review of Systems ROS: GEN: No acute illnesses, no fevers, chills. GI: No n/v/d, eating normally Pulm: No SOB Interactive and getting along well at home.  Otherwise, ROS is as per the HPI.     Objective:   Physical Exam   Physical Exam  Blood pressure 112/80, pulse 67, temperature 98.2 F (36.8 C), temperature source Oral, height 6' (1.829 m), weight 222 lb 12.8 oz (101.061 kg), SpO2 98.00%.  GEN: WDWN, NAD, Non-toxic, A & O x 3 HEENT: Atraumatic, Normocephalic. Neck supple. No masses, No LAD. Ears and Nose: No external deformity. CV: RRR, No M/G/R. No JVD. No thrill. No extra heart sounds. PULM: CTA B, no wheezes, crackles, rhonchi. No retractions. No resp. distress. No accessory muscle  use. EXTR: No c/c/e NEURO Normal gait.  PSYCH: Normally interactive. Conversant. Not depressed or anxious appearing.  Calm demeanor.  Shoulder, L: Strength 5/5. Loss of 15 of abduction. With the shoulder at 90 of abduction, there is pain with terminal external rotation compared to the contralateral side. Loss of approximately 85 of internal range of motion. Negative Leanord Asal test. Positive Neer test. Negative speed's test. Negative Yergason's test. Negative a.c. crossover compression test.      Assessment & Plan:   1. DIABETES MELLITUS, TYPE II  Hemoglobin A1c  2. Flu vaccine need  Flu vaccine greater than or equal to 3yo preservative free IM  3. HYPERTENSION    4. Adhesive capsulitis of left shoulder      Diagnosis: adhesive capsulitis  Patient was given a systematic ROM protocol from [facility] to be done daily. Emphasized that without adherence to HEP, likely will not get better. Will need RTC str and scapular stabilization to fix underlying mechanics.  Intrarticular Shoulder Injection Verbal consent was obtained from the patient. Risks, benefits, and alternatives were explained. Patient prepped with Betadine and Ethyl Chloride used for anesthesia. An intraarticular shoulder injection was performed using the posterior approach. The patient tolerated the procedure well and had decreased pain post injection. No complications. Injection: 9 cc of Lidocaine 1% and 1cc of depo-medrol 40 mg. Needle: 22 gauge

## 2011-04-27 NOTE — Patient Instructions (Signed)
F/u 6-7 months for full CPX

## 2011-04-28 NOTE — Progress Notes (Signed)
Addended by: Liane Comber C on: 04/28/2011 11:11 AM   Modules accepted: Orders

## 2011-05-17 ENCOUNTER — Other Ambulatory Visit: Payer: Self-pay | Admitting: Family Medicine

## 2011-08-21 ENCOUNTER — Other Ambulatory Visit: Payer: Self-pay | Admitting: Family Medicine

## 2011-10-17 ENCOUNTER — Other Ambulatory Visit: Payer: Self-pay | Admitting: Family Medicine

## 2011-10-17 DIAGNOSIS — E119 Type 2 diabetes mellitus without complications: Secondary | ICD-10-CM

## 2011-10-17 DIAGNOSIS — E785 Hyperlipidemia, unspecified: Secondary | ICD-10-CM

## 2011-10-17 DIAGNOSIS — Z79899 Other long term (current) drug therapy: Secondary | ICD-10-CM

## 2011-10-17 DIAGNOSIS — Z125 Encounter for screening for malignant neoplasm of prostate: Secondary | ICD-10-CM

## 2011-10-18 ENCOUNTER — Other Ambulatory Visit: Payer: Managed Care, Other (non HMO)

## 2011-10-25 ENCOUNTER — Encounter: Payer: Managed Care, Other (non HMO) | Admitting: Family Medicine

## 2011-10-26 ENCOUNTER — Encounter: Payer: Managed Care, Other (non HMO) | Admitting: Family Medicine

## 2011-10-26 DIAGNOSIS — Z0289 Encounter for other administrative examinations: Secondary | ICD-10-CM

## 2012-04-24 ENCOUNTER — Other Ambulatory Visit: Payer: Self-pay | Admitting: Family Medicine

## 2012-06-11 ENCOUNTER — Other Ambulatory Visit: Payer: Self-pay | Admitting: Family Medicine

## 2012-06-11 NOTE — Telephone Encounter (Signed)
Left message for patient to call back  

## 2012-06-11 NOTE — Telephone Encounter (Signed)
Received refill request electronically. Last office visit 04/27/11. See history of no show and cancellations. Is it okay to refill medication?

## 2012-06-11 NOTE — Telephone Encounter (Signed)
Ok to refill 30 days, 0 refills  F/u in office

## 2012-06-13 NOTE — Telephone Encounter (Signed)
Advised Dr. Patsy Lager that patient has not returned phone calls. Advised to okay #30 with the pharmacy and send message no more refills until seen in the office per Dr. Patsy Lager.

## 2012-06-13 NOTE — Telephone Encounter (Signed)
Left message on machine to call back  

## 2012-08-15 DIAGNOSIS — S5291XA Unspecified fracture of right forearm, initial encounter for closed fracture: Secondary | ICD-10-CM

## 2012-08-15 DIAGNOSIS — S82891A Other fracture of right lower leg, initial encounter for closed fracture: Secondary | ICD-10-CM

## 2012-08-15 HISTORY — DX: Other fracture of right lower leg, initial encounter for closed fracture: S82.891A

## 2012-08-15 HISTORY — DX: Unspecified fracture of right forearm, initial encounter for closed fracture: S52.91XA

## 2012-08-26 ENCOUNTER — Other Ambulatory Visit: Payer: Self-pay | Admitting: Family Medicine

## 2013-06-05 ENCOUNTER — Ambulatory Visit (INDEPENDENT_AMBULATORY_CARE_PROVIDER_SITE_OTHER): Payer: Self-pay | Admitting: Family Medicine

## 2013-06-05 ENCOUNTER — Encounter: Payer: Self-pay | Admitting: Family Medicine

## 2013-06-05 VITALS — BP 140/90 | HR 73 | Temp 98.4°F | Ht 72.0 in | Wt 201.5 lb

## 2013-06-05 DIAGNOSIS — R55 Syncope and collapse: Secondary | ICD-10-CM

## 2013-06-05 DIAGNOSIS — I10 Essential (primary) hypertension: Secondary | ICD-10-CM

## 2013-06-05 DIAGNOSIS — E119 Type 2 diabetes mellitus without complications: Secondary | ICD-10-CM

## 2013-06-05 DIAGNOSIS — E785 Hyperlipidemia, unspecified: Secondary | ICD-10-CM

## 2013-06-05 DIAGNOSIS — Z79899 Other long term (current) drug therapy: Secondary | ICD-10-CM

## 2013-06-05 LAB — HEPATIC FUNCTION PANEL
ALT: 28 U/L (ref 0–53)
Bilirubin, Direct: 0 mg/dL (ref 0.0–0.3)
Total Bilirubin: 0.6 mg/dL (ref 0.3–1.2)

## 2013-06-05 LAB — LIPID PANEL
Cholesterol: 228 mg/dL — ABNORMAL HIGH (ref 0–200)
HDL: 38.7 mg/dL — ABNORMAL LOW (ref >39.00)
Total CHOL/HDL Ratio: 6
Triglycerides: 185 mg/dL — ABNORMAL HIGH (ref 0.0–149.0)
VLDL: 37 mg/dL (ref 0.0–40.0)

## 2013-06-05 LAB — BASIC METABOLIC PANEL
BUN: 10 mg/dL (ref 6–23)
Chloride: 97 mEq/L (ref 96–112)
Creatinine, Ser: 0.9 mg/dL (ref 0.4–1.5)
GFR: 93.77 mL/min (ref >60.00)

## 2013-06-05 LAB — CBC WITH DIFFERENTIAL/PLATELET
Basophils Absolute: 0 10*3/uL (ref 0.0–0.1)
Eosinophils Relative: 0.7 % (ref 0.0–5.0)
MCV: 87.4 fl (ref 78.0–100.0)
Monocytes Absolute: 0.8 10*3/uL (ref 0.1–1.0)
Neutrophils Relative %: 59.3 % (ref 43.0–77.0)
Platelets: 187 10*3/uL (ref 150.0–400.0)
RDW: 13.9 % (ref 11.5–14.6)
WBC: 9.7 10*3/uL (ref 4.5–10.5)

## 2013-06-05 LAB — LDL CHOLESTEROL, DIRECT: Direct LDL: 174.5 mg/dL

## 2013-06-05 MED ORDER — PRAVASTATIN SODIUM 40 MG PO TABS: 40.0000 mg | ORAL_TABLET | Freq: Every day | ORAL | Status: DC

## 2013-06-05 NOTE — Patient Instructions (Signed)
REFERRAL: GO THE THE FRONT ROOM AT THE ENTRANCE OF OUR CLINIC, NEAR CHECK IN. ASK FOR MARION. SHE WILL HELP YOU SET UP YOUR REFERRAL. DATE: TIME:  

## 2013-06-05 NOTE — Addendum Note (Signed)
Addended by: Hannah Beat on: 06/05/2013 05:48 PM   Modules accepted: Orders, Medications
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
## 2013-06-06 ENCOUNTER — Ambulatory Visit (INDEPENDENT_AMBULATORY_CARE_PROVIDER_SITE_OTHER): Payer: Self-pay | Admitting: Neurology

## 2013-06-06 ENCOUNTER — Encounter: Payer: Self-pay | Admitting: Neurology

## 2013-06-06 VITALS — BP 130/76 | HR 72 | Temp 98.1°F | Ht 72.0 in | Wt 205.0 lb

## 2013-06-06 DIAGNOSIS — R55 Syncope and collapse: Secondary | ICD-10-CM

## 2013-06-06 NOTE — Patient Instructions (Signed)
I am not really sure what happened to you at this point, due to limited history.  I think we need to perform a seizure workup.  This includes MRI of the brain and EEG to look at your brain waves.  Once you have insurance, call us and we can schedule these tests.  I want to see you back in the office soon after these tests are performed.  In the meantime, you cannot drive.
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
## 2013-06-13 ENCOUNTER — Ambulatory Visit (INDEPENDENT_AMBULATORY_CARE_PROVIDER_SITE_OTHER): Payer: Self-pay | Admitting: Cardiovascular Disease

## 2013-06-13 ENCOUNTER — Encounter: Payer: Self-pay | Admitting: Cardiovascular Disease

## 2013-06-13 VITALS — BP 170/105 | HR 86 | Ht 72.0 in | Wt 200.8 lb

## 2013-06-13 DIAGNOSIS — R55 Syncope and collapse: Secondary | ICD-10-CM

## 2013-06-13 DIAGNOSIS — I10 Essential (primary) hypertension: Secondary | ICD-10-CM
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
## 2013-06-13 NOTE — Patient Instructions (Addendum)
Your physician has requested that you have an echocardiogram ( out of pocket cost estimated at about $700- $800)- we will hold on scheduling this for you unless you decide to proceed. Echocardiography is a painless test that uses sound waves to create images of your heart. It provides your doctor with information about the size and shape of your heart and how well your heart's chambers and valves are working. This procedure takes approximately one hour. There are no restrictions for this procedure.  Your physician has recommended that you wear an event monitor (out of pocket cost estimated at about $465). Event monitors are medical devices that record the heart's electrical activity. Doctors most often [place] these monitors to diagnose arrhythmias. Arrhythmias are problems with the speed or rhythm of the heartbeat. The monitor is a small, portable device. You can wear one while you do your normal daily activities. This is usually used to diagnose what is causing palpitations/syncope (passing out).  Follow up after tests. - in about 5-6 weeks with Dr. Kirke Corin.
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
## 2013-06-13 NOTE — Assessment & Plan Note (Signed)
His blood pressure is elevated today but he seems to be very stressed. Continue to monitor. He was not orthostatic today.

## 2013-06-19 ENCOUNTER — Telehealth: Payer: Self-pay

## 2013-06-19 NOTE — Telephone Encounter (Signed)
LMOM to call regarding 30 day event monitor.

## 2013-06-21 NOTE — Telephone Encounter (Signed)
LMOM TCB regarding a 30 day event monitor.

## 2013-06-26 NOTE — Telephone Encounter (Signed)
The patient will contact [employer] today to have the monitor mailed to his home.

## 2013-07-01 DIAGNOSIS — R55 Syncope and collapse: Secondary | ICD-10-CM

## 2013-07-02 ENCOUNTER — Other Ambulatory Visit: Payer: Self-pay | Admitting: Family Medicine

## 2013-07-06 ENCOUNTER — Other Ambulatory Visit: Payer: Self-pay | Admitting: Family Medicine

## 2013-08-07 ENCOUNTER — Telehealth: Payer: Self-pay | Admitting: *Deleted

## 2013-08-07 NOTE — Telephone Encounter (Signed)
Attempted to call patient to inform him holter results were normal per Dr. Kirke Corin. Left voicemail.

## 2013-08-09 NOTE — Telephone Encounter (Signed)
Left message on voicemail of normal results.  Asked pt to call w/ any questions or concerns.

## 2013-08-14 ENCOUNTER — Encounter (INDEPENDENT_AMBULATORY_CARE_PROVIDER_SITE_OTHER): Payer: Self-pay

## 2013-08-14 DIAGNOSIS — R55 Syncope and collapse: Secondary | ICD-10-CM

## 2013-12-28 ENCOUNTER — Other Ambulatory Visit: Payer: Self-pay | Admitting: Family Medicine

## 2014-02-21 ENCOUNTER — Telehealth: Payer: Self-pay | Admitting: *Deleted

## 2014-02-21 NOTE — Telephone Encounter (Signed)
**  DIABETIC BUNDLE**   Left  Voicemail requesting pt to call office to schedule a f/u with Dr. Patsy Lageropland (Re-check BP), with a lab appt. prior (check lipid profile and a1c)

## 2014-04-04 ENCOUNTER — Telehealth: Payer: Self-pay | Admitting: Family Medicine

## 2014-04-04 ENCOUNTER — Other Ambulatory Visit: Payer: Self-pay | Admitting: Family Medicine

## 2014-04-04 NOTE — Telephone Encounter (Signed)
Left message for pt to call office  Please schedule appointment  Ronald Stephens, CMA Ronald Stephens            Can you please call and schedule CPE with fasting labs with Dr. Patsy Lageropland sometime in October.   Thanks,  Ronald Stephens

## 2014-04-10 NOTE — Telephone Encounter (Signed)
Tried calling pt  Voice mail is full could not leave message

## 2014-04-14 NOTE — Telephone Encounter (Signed)
Tried called pt voice mail is full and could not leave message

## 2014-04-18 ENCOUNTER — Encounter: Payer: Self-pay | Admitting: Family Medicine

## 2014-04-18 NOTE — Telephone Encounter (Signed)
Mailed letter asking Ronald Stephens to call office to schedule appointments

## 2014-04-19 ENCOUNTER — Other Ambulatory Visit: Payer: Self-pay | Admitting: Family Medicine

## 2014-04-20 ENCOUNTER — Other Ambulatory Visit: Payer: Self-pay | Admitting: Family Medicine

## 2014-07-12 ENCOUNTER — Other Ambulatory Visit: Payer: Self-pay | Admitting: Family Medicine

## 2015-01-14 ENCOUNTER — Emergency Department (HOSPITAL_COMMUNITY): Payer: BLUE CROSS/BLUE SHIELD

## 2015-01-14 ENCOUNTER — Encounter (HOSPITAL_COMMUNITY): Payer: Self-pay | Admitting: Cardiology

## 2015-01-14 ENCOUNTER — Emergency Department (HOSPITAL_COMMUNITY)
Admission: EM | Admit: 2015-01-14 | Discharge: 2015-01-14 | Disposition: A | Discharge status: Home / Self Care | Payer: BLUE CROSS/BLUE SHIELD | Attending: Emergency Medicine | Admitting: Emergency Medicine

## 2015-01-14 DIAGNOSIS — I10 Essential (primary) hypertension: Secondary | ICD-10-CM | POA: Diagnosis not present

## 2015-01-14 DIAGNOSIS — Z8781 Personal history of (healed) traumatic fracture: Secondary | ICD-10-CM | POA: Insufficient documentation

## 2015-01-14 DIAGNOSIS — Z720 Tobacco use: Secondary | ICD-10-CM | POA: Diagnosis not present

## 2015-01-14 DIAGNOSIS — R1011 Right upper quadrant pain: Secondary | ICD-10-CM | POA: Diagnosis present

## 2015-01-14 DIAGNOSIS — E785 Hyperlipidemia, unspecified: Secondary | ICD-10-CM | POA: Diagnosis not present

## 2015-01-14 DIAGNOSIS — K279 Peptic ulcer, site unspecified, unspecified as acute or chronic, without hemorrhage or perforation: Secondary | ICD-10-CM | POA: Diagnosis not present

## 2015-01-14 DIAGNOSIS — R011 Cardiac murmur, unspecified: Secondary | ICD-10-CM | POA: Insufficient documentation

## 2015-01-14 DIAGNOSIS — M1990 Unspecified osteoarthritis, unspecified site: Secondary | ICD-10-CM | POA: Insufficient documentation

## 2015-01-14 DIAGNOSIS — Z79899 Other long term (current) drug therapy: Secondary | ICD-10-CM | POA: Diagnosis not present

## 2015-01-14 DIAGNOSIS — K2970 Gastritis, unspecified, without bleeding: Secondary | ICD-10-CM

## 2015-01-14 DIAGNOSIS — E119 Type 2 diabetes mellitus without complications: Secondary | ICD-10-CM | POA: Insufficient documentation

## 2015-01-14 LAB — CBC WITH DIFFERENTIAL/PLATELET
BASOS ABS: 0 10*3/uL (ref 0.0–0.1)
Basophils Relative: 0 % (ref 0–1)
EOS ABS: 0 10*3/uL (ref 0.0–0.7)
EOS PCT: 0 % (ref 0–5)
HCT: 45.6 % (ref 39.0–52.0)
HEMOGLOBIN: 16.1 g/dL (ref 13.0–17.0)
LYMPHS ABS: 1.4 10*3/uL (ref 0.7–4.0)
Lymphocytes Relative: 13 % (ref 12–46)
MCH: 30.8 pg (ref 26.0–34.0)
MCHC: 35.3 g/dL (ref 30.0–36.0)
MCV: 87.4 fL (ref 78.0–100.0)
MONO ABS: 0.8 10*3/uL (ref 0.1–1.0)
MONOS PCT: 7 % (ref 3–12)
NEUTROS ABS: 9 10*3/uL — AB (ref 1.7–7.7)
Neutrophils Relative %: 80 % — ABNORMAL HIGH (ref 43–77)
PLATELETS: 127 10*3/uL — AB (ref 150–400)
RBC: 5.22 MIL/uL (ref 4.22–5.81)
RDW: 12.8 % (ref 11.5–15.5)
WBC: 11.3 10*3/uL — AB (ref 4.0–10.5)

## 2015-01-14 LAB — COMPREHENSIVE METABOLIC PANEL
ALBUMIN: 3.9 g/dL (ref 3.5–5.0)
ALK PHOS: 83 U/L (ref 38–126)
ALT: 41 U/L (ref 17–63)
AST: 38 U/L (ref 15–41)
Anion gap: 15 (ref 5–15)
BILIRUBIN TOTAL: 1.1 mg/dL (ref 0.3–1.2)
BUN: 9 mg/dL (ref 6–20)
CHLORIDE: 86 mmol/L — AB (ref 101–111)
CO2: 30 mmol/L (ref 22–32)
Calcium: 9.1 mg/dL (ref 8.9–10.3)
Creatinine, Ser: 0.82 mg/dL (ref 0.61–1.24)
Glucose, Bld: 194 mg/dL — ABNORMAL HIGH (ref 65–99)
Potassium: 3.5 mmol/L (ref 3.5–5.1)
SODIUM: 131 mmol/L — AB (ref 135–145)
TOTAL PROTEIN: 8.7 g/dL — AB (ref 6.5–8.1)

## 2015-01-14 LAB — LIPASE, BLOOD: LIPASE: 46 U/L (ref 22–51)

## 2015-01-14 MED ORDER — SUCRALFATE 1 G PO TABS: 1.0000 g | ORAL_TABLET | Freq: Four times a day (QID) | ORAL | Status: DC

## 2015-01-14 MED ORDER — ONDANSETRON 4 MG PO TBDP: 4.0000 mg | ORAL_TABLET | Freq: Three times a day (TID) | ORAL | Status: DC | PRN

## 2015-01-14 MED ORDER — HYDROCODONE-ACETAMINOPHEN 5-325 MG PO TABS: 1.0000 | ORAL_TABLET | ORAL | Status: DC | PRN

## 2015-01-14 MED ORDER — OMEPRAZOLE 20 MG PO CPDR: 20.0000 mg | DELAYED_RELEASE_CAPSULE | Freq: Two times a day (BID) | ORAL | Status: DC

## 2015-01-14 MED ORDER — PANTOPRAZOLE SODIUM 40 MG IV SOLR
40.0000 mg | Freq: Once | INTRAVENOUS | Status: AC
  Administered 2015-01-14: 40 mg via INTRAVENOUS
  Filled 2015-01-14: qty 40

## 2015-01-14 MED ORDER — HYDROMORPHONE HCL 1 MG/ML IJ SOLN
1.0000 mg | INTRAMUSCULAR | Status: DC | PRN
Start: 2015-01-14 — End: 2015-01-14
  Administered 2015-01-14: 1 mg via INTRAVENOUS
  Filled 2015-01-14 (×2): qty 1

## 2015-01-14 MED ORDER — ONDANSETRON HCL 4 MG/2ML IJ SOLN
4.0000 mg | Freq: Once | INTRAMUSCULAR | Status: AC
  Administered 2015-01-14: 4 mg via INTRAVENOUS
  Filled 2015-01-14: qty 2

## 2015-01-14 MED ORDER — MORPHINE SULFATE 4 MG/ML IJ SOLN
4.0000 mg | Freq: Once | INTRAMUSCULAR | Status: AC
Start: 2015-01-14 — End: 2015-01-14
  Administered 2015-01-14: 4 mg via INTRAVENOUS
  Filled 2015-01-14: qty 1

## 2015-01-14 NOTE — ED Notes (Signed)
Upper abdominal pain since last night.

## 2015-01-14 NOTE — ED Provider Notes (Signed)
CSN: [id number]     Arrival date & time 01/14/15  0727 History   First MD Initiated Contact with Patient 01/14/15 0730     Chief Complaint  Patient presents with  . Abdominal Pain      HPI  Patient presents for evaluation of epigastric and right upper quadrant pain. Symptoms started yesterday midday. No associated food intake. Increasing pain described as aching and burning. Radiates into his back. No chest pain or shortness of breath. No recent food intolerances. He had about 24 hours of discomfort a few weeks ago. This resolved. He had taken some antiacids at the time. He alleges that he may have eaten something "bad".  Denies bloody or black stools. Nausea but no emesis. Not been jaundiced.  Smokes daily. Drinks several beers daily. Motrin daily for sinuses or his back.  Drinks  2-3 caffeine beverages. daily.  Past Medical History  Diagnosis Date  . Hypertension   . Hyperlipidemia   . Diabetes mellitus   . Tobacco abuse   . Arthritis   . Right forearm fracture 2014  . Bilateral ankle fractures 2014  . Syncope and collapse   . Heart murmur     as child   Past Surgical History  Procedure Laterality Date  . Cardiac catheterization      as teen   Family History  Problem Relation Age of Onset  . Adopted: Yes   History  Substance Use Topics  . Smoking status: Current Every Day Smoker -- 1.00 packs/day for 30 years    Types: Cigarettes  . Smokeless tobacco: Current User  . Alcohol Use: Yes     Comment: beer everyday     Review of Systems  Constitutional: Negative for fever, chills, diaphoresis, appetite change and fatigue.  HENT: Negative for mouth sores, sore throat and trouble swallowing.   Eyes: Negative for visual disturbance.  Respiratory: Negative for cough, chest tightness, shortness of breath and wheezing.   Cardiovascular: Negative for chest pain.  Gastrointestinal: Positive for nausea and abdominal pain. Negative for vomiting, diarrhea and abdominal  distention.  Endocrine: Negative for polydipsia, polyphagia and polyuria.  Genitourinary: Negative for dysuria, frequency and hematuria.  Musculoskeletal: Negative for gait problem.  Skin: Negative for color change, pallor and rash.  Neurological: Negative for dizziness, syncope, light-headedness and headaches.  Hematological: Does not bruise/bleed easily.  Psychiatric/Behavioral: Negative for behavioral problems and confusion.      Allergies  Review of patient's allergies indicates no known allergies.  Home Medications   Prior to Admission medications   Medication Sig Start Date End Date Taking? Authorizing Provider  atorvastatin (LIPITOR) 40 MG tablet Take 40 mg by mouth daily.   Yes Historical Provider, MD  Ibuprofen (ADVIL PO) Take by mouth as needed.   Yes Historical Provider, MD  lisinopril-hydrochlorothiazide (PRINZIDE,ZESTORETIC) 20-12.5 MG per tablet TAKE 1 TABLET BY MOUTH EVERY DAY 07/12/14  Yes Spencer Copland, MD  metFORMIN (GLUCOPHAGE-XR) 500 MG 24 hr tablet TAKE 1 TABLET BY MOUTH EVERY DAY 07/12/14  Yes Hannah Beat, MD  HYDROcodone-acetaminophen (NORCO/VICODIN) 5-325 MG per tablet Take 1 tablet by mouth every 4 (four) hours as needed. 01/14/15   Rolland Porter, MD  omeprazole (PRILOSEC) 20 MG capsule Take 1 capsule (20 mg total) by mouth 2 (two) times daily. 01/14/15   Rolland Porter, MD  ondansetron (ZOFRAN ODT) 4 MG disintegrating tablet Take 1 tablet (4 mg total) by mouth every 8 (eight) hours as needed for nausea. 01/14/15   Rolland Porter, MD  pravastatin (PRAVACHOL)  40 MG tablet TAKE 1 TABLET (40 MG TOTAL) BY MOUTH DAILY. Patient not taking: Reported on 01/14/2015 04/20/14   Hannah BeatSpencer Copland, MD  sucralfate (CARAFATE) 1 G tablet Take 1 tablet (1 g total) by mouth 4 (four) times daily. 01/14/15   Rolland PorterMark Saliha Salts, MD   BP 164/98 mmHg  Pulse 84  Temp(Src) 98 F (36.7 C) (Oral)  Resp 17  SpO2 100% Physical Exam  Constitutional: He is oriented to person, place, and time. He appears  well-developed and well-nourished. No distress.  HENT:  Head: Normocephalic.  Eyes: Conjunctivae are normal. Pupils are equal, round, and reactive to light. No scleral icterus.  Neck: Normal range of motion. Neck supple. No thyromegaly present.  Cardiovascular: Normal rate and regular rhythm.  Exam reveals no gallop and no friction rub.   No murmur heard. Pulmonary/Chest: Effort normal and breath sounds normal. No respiratory distress. He has no wheezes. He has no rales.  Abdominal: Soft. Bowel sounds are normal. He exhibits no distension. There is no tenderness. There is no rebound.    Musculoskeletal: Normal range of motion.  Neurological: He is alert and oriented to person, place, and time.  Skin: Skin is warm and dry. No rash noted.  Psychiatric: He has a normal mood and affect. His behavior is normal.    ED Course  Procedures (including critical care time) Labs Review Labs Reviewed  CBC WITH DIFFERENTIAL/PLATELET - Abnormal; Notable for the following:    WBC 11.3 (*)    Platelets 127 (*)    Neutrophils Relative % 80 (*)    Neutro Abs 9.0 (*)    All other components within normal limits  COMPREHENSIVE METABOLIC PANEL - Abnormal; Notable for the following:    Sodium 131 (*)    Chloride 86 (*)    Glucose, Bld 194 (*)    Total Protein 8.7 (*)    All other components within normal limits  LIPASE, BLOOD    Imaging Review [place] Abdomen Limited Ruq  01/14/2015   CLINICAL DATA:  Right upper quadrant pain with nausea and vomiting for 1 day  EXAM: US ABDOMEN LIMITED - RIGHT UPPER QUADRANT  COMPARISON:  None.  FINDINGS: Gallbladder:  There is extensive sludge within the gallbladder. There are no echogenic foci which move and shadow as would be expected with gallstones. There are focal areas of mild gallbladder wall thickening. There is no gallbladder wall edema or pericholecystic fluid, however. No sonographic Murphy sign noted.  Common bile duct:  Diameter: 3 mm. There is no intrahepatic  or extrahepatic biliary duct dilatation.  Liver:  No focal lesion identified. Liver echogenicity is diffusely increased. Liver measures approximately 18 cm in length, increased.  IMPRESSION: Within the gallbladder there is extensive sludge. Some of this sludge is conglomerated and does not move. It should be noted that an underlying mass in gallbladder cannot be excluded by ultrasound in this circumstance. There is mild gallbladder wall thickening toward the gallbladder fundus. No gallbladder wall edema seen. No gallstones seen. These findings potentially could indicate a degree of acalculus cholecystitis. If there is concern for potential cystic duct obstruction, nuclear medicine hepatobiliary imaging study could be helpful to further assess.  The echogenicity of the liver is markedly increased, a finding most likely due to hepatic steatosis. A degree of underlying parenchymal liver disease also may be present. While no focal liver lesions are identified, it must be cautioned that the sensitivity of ultrasound for focal liver lesions is diminished in this circumstance.   Electronically  Signed   By: Bretta Bang III M.D.   On: 01/14/2015 08:48     EKG Interpretation None      MDM   Final diagnoses:  RUQ pain  Gastritis  Peptic ulcer disease    Hepatobiliary enzymes, and ultrasound are normal. Symptoms consistent with gastritis/peptic ulcer disease or acid related phenomenon.  Clinically is not bleeding. Is not anemic. No dark stools or melanotic stools. He does smoke daily, drink daily, take anti-inflammatory isn't caffeine daily. Have asked him to stop all of these of these are all direct gastric irritants. Be aware of the possibility of bleeding and recheck with any blood or black stools. Prescription for Carafate, Prilosec, Vicodin, Zofran, GI follow-up.    Rolland Porter, MD 01/14/15 [phone]

## 2015-01-14 NOTE — Discharge Instructions (Signed)
Stop alcohol, tobacco, caffeine, and anti-inflammatory medicines like ibuprofen. Small frequent meals, avoid large meals. Follow-up with GI, Dr. Benard Rink, if not improving   Gastritis, Adult Gastritis is soreness and swelling (inflammation) of the lining of the stomach. Gastritis can develop as a sudden onset (acute) or long-term (chronic) condition. If gastritis is not treated, it can lead to stomach bleeding and ulcers. CAUSES  Gastritis occurs when the stomach lining is weak or damaged. Digestive juices from the stomach then inflame the weakened stomach lining. The stomach lining may be weak or damaged due to viral or bacterial infections. One common bacterial infection is the Helicobacter pylori infection. Gastritis can also result from excessive alcohol consumption, taking certain medicines, or having too much acid in the stomach.  SYMPTOMS  In some cases, there are no symptoms. When symptoms are present, they may include:  Pain or a burning sensation in the upper abdomen.  Nausea.  Vomiting.  An uncomfortable feeling of fullness after eating. DIAGNOSIS  Your caregiver may suspect you have gastritis based on your symptoms and a physical exam. To determine the cause of your gastritis, your caregiver may perform the following:  Blood or stool tests to check for the H pylori bacterium.  Gastroscopy. A thin, flexible tube (endoscope) is passed down the esophagus and into the stomach. The endoscope has a light and camera on the end. Your caregiver uses the endoscope to view the inside of the stomach.  Taking a tissue sample (biopsy) from the stomach to examine under a microscope. TREATMENT  Depending on the cause of your gastritis, medicines may be prescribed. If you have a bacterial infection, such as an H pylori infection, antibiotics may be given. If your gastritis is caused by too much acid in the stomach, H2 blockers or antacids may be given. Your caregiver may recommend that you stop  taking aspirin, ibuprofen, or other nonsteroidal anti-inflammatory drugs (NSAIDs). HOME CARE INSTRUCTIONS  Only take over-the-counter or prescription medicines as directed by your caregiver.  If you were given antibiotic medicines, take them as directed. Finish them even if you start to feel better.  Drink enough fluids to keep your urine clear or pale yellow.  Avoid foods and drinks that make your symptoms worse, such as:  Caffeine or alcoholic drinks.  Chocolate.  Peppermint or mint flavorings.  Garlic and onions.  Spicy foods.  Citrus fruits, such as oranges, lemons, or limes.  Tomato-based foods such as sauce, chili, salsa, and pizza.  Fried and fatty foods.  Eat small, frequent meals instead of large meals. SEEK IMMEDIATE MEDICAL CARE IF:   You have black or dark red stools.  You vomit blood or material that looks like coffee grounds.  You are unable to keep fluids down.  Your abdominal pain gets worse.  You have a fever.  You do not feel better after 1 week.  You have any other questions or concerns. MAKE SURE YOU:  Understand these instructions.  Will watch your condition.  Will get help right away if you are not doing well or get worse. Document Released: 07/26/2001 Document Revised: 01/31/2012 Document Reviewed: 09/14/2011 [facility] Patient Information 2015 [place], [place]. This information is not intended to replace advice given to you by your health care provider. Make sure you discuss any questions you have with your health care provider.  Peptic Ulcer A peptic ulcer is a painful sore in the lining of your esophagus, stomach, or in the first part of your small intestine. The main causes of  an ulcer can be:  An infection.  Using certain pain medicines too often or too much.  Smoking. HOME CARE  Avoid smoking, alcohol, and caffeine.  Avoid foods that bother you.  Only take medicine as told by your doctor. Do not take any medicines your  doctor has not approved.  Keep all doctor visits as told. GET HELP IF:  You do not get better in 7 days after starting treatment.  You keep having an upset stomach (indigestion) or heartburn. GET HELP RIGHT AWAY IF:  You have sudden, sharp, or lasting belly (abdominal) pain.  You have bloody, black, or tarry poop (stool).  You throw up (vomit) blood or your throw up looks like coffee grounds.  You get light-headed, weak, or feel like you will pass out (faint).  You get sweaty or feel sticky and cold to the touch (clammy). MAKE SURE YOU:   Understand these instructions.  Will watch your condition.  Will get help right away if you are not doing well or get worse. Document Released: 10/26/2009 Document Revised: 12/16/2013 Document Reviewed: 02/29/2012 [facility] Patient Information 2015 [place]. This information is not intended to replace advice given to you by your health care provider. Make sure you discuss any questions you have with your health care provider.

## 2015-01-16 ENCOUNTER — Telehealth: Payer: Self-pay | Admitting: Gastroenterology

## 2015-01-16 NOTE — Telephone Encounter (Signed)
Patient called the office Friday asking to make OV. He had been seen in the ED recently and was told the follow up with GI. He will be a new patient. Please review and advise. I told him that we are here half a day today and the provider would be looking over his medical records and we would be getting back with him hopefully one day next week. He agreed and asked for us to call his cell [phone] and he prefers to come on a Tuesday if possible.

## 2015-01-19 ENCOUNTER — Encounter: Payer: Self-pay | Admitting: Nurse Practitioner

## 2015-01-19 NOTE — Telephone Encounter (Signed)
May offer new patient appt.

## 2015-01-19 NOTE — Telephone Encounter (Signed)
Pt aware of OV on 6/28 at 9 with EG and appt letter mailed (pt prefers Tuesdays)

## 2015-02-10 ENCOUNTER — Ambulatory Visit: Payer: BLUE CROSS/BLUE SHIELD | Admitting: Nurse Practitioner

## 2015-03-06 ENCOUNTER — Ambulatory Visit (INDEPENDENT_AMBULATORY_CARE_PROVIDER_SITE_OTHER): Payer: BLUE CROSS/BLUE SHIELD | Admitting: Nurse Practitioner

## 2015-03-06 ENCOUNTER — Encounter: Payer: Self-pay | Admitting: Nurse Practitioner

## 2015-03-06 VITALS — BP 139/87 | HR 89 | Temp 98.5°F | Ht 72.0 in | Wt 186.2 lb

## 2015-03-06 DIAGNOSIS — R1013 Epigastric pain: Secondary | ICD-10-CM

## 2015-03-06 DIAGNOSIS — R935 Abnormal findings on diagnostic imaging of other abdominal regions, including retroperitoneum: Secondary | ICD-10-CM | POA: Diagnosis not present

## 2015-03-06 MED ORDER — OMEPRAZOLE 20 MG PO CPDR: 20.0000 mg | DELAYED_RELEASE_CAPSULE | Freq: Two times a day (BID) | ORAL | Status: DC

## 2015-03-06 NOTE — Patient Instructions (Addendum)
1. I have printed a refill of her Prilosec for you. 2. We will provide 2 with local primary care providers for you to choose from (Dr. Hawkins, Dr. Margo Aye, [facility] Dr. Mearl Latin, etc).  3. Given the fat in her liver on her ultrasound it is imperative that she keep her diabetes under good control. Follow a low-fat diet and exercise 30 minutes a day. 4. As we discussed he should cut back and eventually abstain from drinking alcohol. 5. Have your labs drawn the next couple days. 6. Return for further evaluation in 3 months   .Food Choices for Gastroesophageal Reflux Disease When you have gastroesophageal reflux disease (GERD), the foods you eat and your eating habits are very important. Choosing the right foods can help ease the discomfort of GERD. WHAT GENERAL GUIDELINES DO I NEED TO FOLLOW?  Choose fruits, vegetables, whole grains, low-fat dairy products, and low-fat meat, fish, and poultry.  Limit fats such as oils, salad dressings, butter, nuts, and avocado.  Keep a food diary to identify foods that cause symptoms.  Avoid foods that cause reflux. These may be different for different people.  Eat frequent small meals instead of three large meals each day.  Eat your meals slowly, in a relaxed setting.  Limit fried foods.  Cook foods using methods other than frying.  Avoid drinking alcohol.  Avoid drinking large amounts of liquids with your meals.  Avoid bending over or lying down until 2-3 hours after eating. WHAT FOODS ARE NOT RECOMMENDED? The following are some foods and drinks that may worsen your symptoms: Vegetables Tomatoes. Tomato juice. Tomato and spaghetti sauce. Chili peppers. Onion and garlic. Horseradish. Fruits Oranges, grapefruit, and lemon (fruit and juice). Meats High-fat meats, fish, and poultry. This includes hot dogs, ribs, ham, sausage, salami, and bacon. Dairy Whole milk and chocolate milk. Sour cream. Cream. Butter. Ice cream. Cream cheese.   Beverages Coffee and tea, with or without caffeine. Carbonated beverages or energy drinks. Condiments Hot sauce. Barbecue sauce.  Sweets/Desserts Chocolate and cocoa. Donuts. Peppermint and spearmint. Fats and Oils High-fat foods, including [place] fries and potato chips. Other Vinegar. Strong spices, such as black pepper, white pepper, red pepper, cayenne, curry powder, cloves, ginger, and chili powder. The items listed above may not be a complete list of foods and beverages to avoid. Contact your dietitian for more information. Document Released: 08/01/2005 Document Revised: 08/06/2013 Document Reviewed: 06/05/2013 [facility] Patient Information 2015 [place], [place]. This information is not intended to replace advice given to you by your health care provider. Make sure you discuss any questions you have with your health care provider.

## 2015-03-06 NOTE — Assessment & Plan Note (Addendum)
54 year old male presents for ER follow-up for dyspepsia. Prilosec seems to have improved his symptoms significantly. He is also drastically altered his diet to avoid offending agents. He is been asymptomatic since his ER visit, dietary changes, and PPI. He continues to drink daily. Have recommended and counseled him to cut back and eventually abstain from EtOH both as a gastric irritant and as well because of his abnormal abdominal ultrasound. We will check labs today including CBC, CMP. We'll provide resources to educate on foods to avoid for dyspepsia/possible GERD. Advised him to return in 3 months for further follow-up at which point we can consider dose reduction on his PPI if he is improving. If recurrent symptoms or no improvement can consider further evaluation including endoscopy.

## 2015-03-06 NOTE — Assessment & Plan Note (Signed)
Ultrasound completed in the emergency room showed liver echogenicity markedly increased consistent with hepatic steatosis. The patient drinks 5-6 beers on a daily basis. Today we will check CBC and CMP. Refer him to a local PCP for routine care and diabetes management. Advised low fat diet and daily exercise. Advised him to cut back and eventually abstain from alcohol consumption. We will have him return in 3 months for further evaluation.

## 2015-03-06 NOTE — Progress Notes (Signed)
Primary Care Physician:  Hannah Beat, MD Primary Gastroenterologist:  Dr. Jena Gauss  Chief Complaint  Patient presents with  . Abdominal Pain    upper abd pain    HPI:   54 year old male seen in the emergency room 01/14/2015 for epigastric and right upper quadrant pain which was described as aching and burning and radiated to his back. His symptoms had occurred previously as well but self resolved. ER note with associated labs and imaging reviewed. His white blood cell count that day was 11.3. Right upper quadrant ultrasound found extensive sludge within the gallbladder some of which was conglomerated and immobile, no stones noted, unable to exclude gallbladder mass. Noted mild gallbladder wall thickening toward the fundus without edema. Findings potentially indicative of a degree of a calculus cholecystitis. Liver echogenicity markedly increased consistent with hepatic steatosis. The patient was discharged from the emergency room with a prescription for Prilosec, Carafate, Zofran, and Vicodin. The patient was originally scheduled for an appointment at the end of June however this was canceled. He presents today for a rescheduled appointment.  Today he states he has made significant dietary changes and has been taking his Prilosec. He has been doing well since starting this. He has not not had any recurrent symptoms. Is no longer taking Carafate. He has cut out spicy food, acidic food, and other offending foods. Cut out carbonated beverages. Denies abdominal pain, N/V, hematochezia, melena. Denies chest pain, dyspnea, dizziness, lightheadedness, syncope, near syncope. Denies any other upper or lower GI symptoms.  Past Medical History  Diagnosis Date  . Hypertension   . Hyperlipidemia   . Diabetes mellitus   . Tobacco abuse   . Arthritis   . Right forearm fracture 2014  . Bilateral ankle fractures 2014  . Syncope and collapse   . Heart murmur     as child    Past Surgical History    Procedure Laterality Date  . Cardiac catheterization      as teen    Current Outpatient Prescriptions  Medication Sig Dispense Refill  . atorvastatin (LIPITOR) 40 MG tablet Take 40 mg by mouth daily.    . Ibuprofen (ADVIL PO) Take by mouth as needed.    Marland Kitchen lisinopril-hydrochlorothiazide (PRINZIDE,ZESTORETIC) 20-12.5 MG per tablet TAKE 1 TABLET BY MOUTH EVERY DAY 30 tablet 0  . sucralfate (CARAFATE) 1 G tablet Take 1 tablet (1 g total) by mouth 4 (four) times daily. 60 tablet 0  . HYDROcodone-acetaminophen (NORCO/VICODIN) 5-325 MG per tablet Take 1 tablet by mouth every 4 (four) hours as needed. (Patient not taking: Reported on 03/06/2015) 20 tablet 0  . metFORMIN (GLUCOPHAGE-XR) 500 MG 24 hr tablet TAKE 1 TABLET BY MOUTH EVERY DAY (Patient not taking: Reported on 03/06/2015) 30 tablet 0  . omeprazole (PRILOSEC) 20 MG capsule Take 1 capsule (20 mg total) by mouth 2 (two) times daily. (Patient not taking: Reported on 03/06/2015) 60 capsule 1  . ondansetron (ZOFRAN ODT) 4 MG disintegrating tablet Take 1 tablet (4 mg total) by mouth every 8 (eight) hours as needed for nausea. (Patient not taking: Reported on 03/06/2015) 6 tablet 0  . pravastatin (PRAVACHOL) 40 MG tablet TAKE 1 TABLET (40 MG TOTAL) BY MOUTH DAILY. (Patient not taking: Reported on 01/14/2015) 30 tablet 2   No current facility-administered medications for this visit.    Allergies as of 03/06/2015  . (No Known Allergies)    Family History  Problem Relation Age of Onset  . Adopted: Yes    History  Social History  . Marital Status: Married    Spouse Name: N/A  . Number of Children: N/A  . Years of Education: N/A   Occupational History  . supervisor    Social History Main Topics  . Smoking status: Current Every Day Smoker -- 1.00 packs/day for 30 years    Types: Cigarettes  . Smokeless tobacco: Current User  . Alcohol Use: Yes     Comment: beer everyday   . Drug Use: No  . Sexual Activity: Not on file   Other Topics  Concern  . Not on file   Social History Narrative    Review of Systems: General: Negative for anorexia, weight loss, fever, chills, fatigue, weakness. Eyes: Negative for vision changes.  ENT: Negative for hoarseness, difficulty swallowing. CV: Negative for chest pain, angina, palpitations, peripheral edema.  Respiratory: Negative for dyspnea at rest, cough, sputum, wheezing.  GI: See history of present illness. MS: Negative for joint pain, low back pain.  Derm: Negative for rash or itching.  Endo: Negative for unusual weight change.  Heme: Negative for bruising or bleeding.    Physical Exam: BP 139/87 mmHg  Pulse 89  Temp(Src) 98.5 F (36.9 C) (Oral)  Ht 6' (1.829 m)  Wt 186 lb 3.2 oz (84.46 kg)  BMI 25.25 kg/m2 General:   Alert and oriented. Pleasant and cooperative. Well-nourished and well-developed.  Head:  Normocephalic and atraumatic. Eyes:  Without icterus, sclera clear and conjunctiva pink.  Ears:  Normal auditory acuity. Cardiovascular:  S1, S2 present without murmurs appreciated. Normal pulses noted. Extremities without clubbing or edema. Respiratory:  Clear to auscultation bilaterally. No wheezes, rales, or rhonchi. No distress.  Gastrointestinal:  +BS, soft, non-tender and non-distended. No HSM noted. No guarding or rebound. No masses appreciated.  Rectal:  Deferred  Musculoskalatal:  Symmetrical without gross deformities. Normal posture. Skin:  Intact without significant lesions or rashes. Neurologic:  Alert and oriented x4;  grossly normal neurologically. Psych:  Alert and cooperative. Normal mood and affect. Heme/Lymph/Immune: No excessive bruising noted.    03/06/2015 10:35 AM

## 2015-03-17 NOTE — Progress Notes (Signed)
CC'ED TO PCP 

## 2015-04-13 ENCOUNTER — Other Ambulatory Visit: Payer: Self-pay

## 2015-04-15 MED ORDER — OMEPRAZOLE 20 MG PO CPDR: 20.0000 mg | DELAYED_RELEASE_CAPSULE | Freq: Two times a day (BID) | ORAL | Status: DC

## 2015-06-08 ENCOUNTER — Encounter: Payer: Self-pay | Admitting: Nurse Practitioner

## 2015-06-08 ENCOUNTER — Ambulatory Visit: Payer: BLUE CROSS/BLUE SHIELD | Admitting: Nurse Practitioner

## 2015-06-08 ENCOUNTER — Telehealth: Payer: Self-pay | Admitting: Nurse Practitioner

## 2015-06-08 NOTE — Telephone Encounter (Signed)
PATIENT WAS A NO SHOW AND LETTER SENT  °

## 2015-06-10 NOTE — Telephone Encounter (Signed)
Noted  

## 2015-11-19 ENCOUNTER — Other Ambulatory Visit: Payer: Self-pay | Admitting: Gastroenterology

## 2018-02-28 ENCOUNTER — Ambulatory Visit: Payer: 59 | Admitting: Family Medicine

## 2018-02-28 ENCOUNTER — Encounter: Payer: Self-pay | Admitting: Family Medicine

## 2018-02-28 VITALS — BP 140/90 | HR 64 | Temp 98.4°F | Ht 70.6 in | Wt 208.5 lb

## 2018-02-28 DIAGNOSIS — I10 Essential (primary) hypertension: Secondary | ICD-10-CM

## 2018-02-28 DIAGNOSIS — R079 Chest pain, unspecified: Secondary | ICD-10-CM

## 2018-02-28 DIAGNOSIS — E785 Hyperlipidemia, unspecified: Secondary | ICD-10-CM

## 2018-02-28 DIAGNOSIS — Z79899 Other long term (current) drug therapy: Secondary | ICD-10-CM

## 2018-02-28 DIAGNOSIS — E1169 Type 2 diabetes mellitus with other specified complication: Secondary | ICD-10-CM

## 2018-02-28 DIAGNOSIS — Z125 Encounter for screening for malignant neoplasm of prostate: Secondary | ICD-10-CM | POA: Diagnosis not present

## 2018-02-28 DIAGNOSIS — F17200 Nicotine dependence, unspecified, uncomplicated: Secondary | ICD-10-CM

## 2018-02-28 DIAGNOSIS — E119 Type 2 diabetes mellitus without complications: Secondary | ICD-10-CM

## 2018-02-28 NOTE — Patient Instructions (Signed)
REFERRALS TO SPECIALISTS, SPECIAL TESTS (MRI, CT, ULTRASOUNDS)  MARION or  Anastasiya will help you. ASK CHECK-IN FOR HELP.  Specialist appointment times vary a great deal, based on their schedule / openings. -- Some specialists have very long wait times. (Example. Dermatology)    

## 2018-02-28 NOTE — Progress Notes (Signed)
Dr. Karleen Hampshire T. Nell Schrack, MD, CAQ [facility] [address] [place], [address] Phone: [phone] Fax: [phone]  02/28/2018  Patient: Ronald Stephens, MRN: [id number], DOB: [birth year], 57 y.o.  Primary Physician:  Hannah Beat, MD   Chief Complaint  Patient presents with  . Chest Pain    upper off and on for over a year   Subjective:   Ronald Stephens is a 9 y.o. very pleasant male patient who presents with the following:  I remember the patient quite well, and he had been my patient for a long time.  Ultimately he spent some time in the prison system, but he is back in town and living and working.  DM: He is currently not taking any diabetes medicine, and his doctor in the prison system stopped all of his diabetic medication.  HTN: He has stopped taking all of his blood pressure medication, and he lost a lot of weight, and this was stopped in the prison system.  Lipids: He is currently taking his Lipitor at 40 mg a day.  Tob abuse, rare now, was a lot more earlier.   Has been having some chest pain and in both arms, now it has gotten really bad and a lot more common. More times in the seven day period, sometimes none.  He is having some significant chest pain, this is worse with exertion.  This is been getting worse and worse over time, with less exertion bringing on chest pains.  This is notable even with doing some basic yard work and things of this nature at work.  Past Medical History, Surgical History, Social History, Family History, Problem List, Medications, and Allergies have been reviewed and updated if relevant.  Patient Active Problem List   Diagnosis Date Noted  . Controlled type 2 diabetes mellitus without complication, without long-term current use of insulin (HCC) 04/22/2009    Priority: High  . Hyperlipidemia associated with type 2 diabetes mellitus (HCC) 04/10/2009    Priority: High  . Essential  hypertension 03/14/2009    Priority: High  . TOBACCO USE 03/13/2009    Priority: Medium  . Dyspepsia 03/06/2015  . OSTEOARTHRITIS 03/14/2009    Past Medical History:  Diagnosis Date  . Arthritis   . Bilateral ankle fractures 2014  . Diabetes mellitus   . Heart murmur    as child  . Hyperlipidemia   . Hypertension   . Right forearm fracture 2014  . Tobacco abuse     Past Surgical History:  Procedure Laterality Date  . CARDIAC CATHETERIZATION     as teen    Social History   Socioeconomic History  . Marital status: Married    Spouse name: Not on file  . Number of children: Not on file  . Years of education: Not on file  . Highest education level: Not on file  Occupational History  . Occupation: Merchandiser, retail  Social Needs  . Financial resource strain: Not on file  . Food insecurity:    Worry: Not on file    Inability: Not on file  . Transportation needs:    Medical: Not on file    Non-medical: Not on file  Tobacco Use  . Smoking status: Current Every Day Smoker    Packs/day: 0.50    Years: 30.00    Pack years: 15.00    Types: Cigarettes  . Smokeless tobacco: Current User    Last attempt to quit: 03/05/2014  Substance and Sexual Activity  .  Alcohol use: Yes    Alcohol/week: 0.0 oz    Comment: 5-6 beers a day   . Drug use: No  . Sexual activity: Not on file  Lifestyle  . Physical activity:    Days per week: Not on file    Minutes per session: Not on file  . Stress: Not on file  Relationships  . Social connections:    Talks on phone: Not on file    Gets together: Not on file    Attends religious service: Not on file    Active member of club or organization: Not on file    Attends meetings of clubs or organizations: Not on file    Relationship status: Not on file  . Intimate partner violence:    Fear of current or ex partner: Not on file    Emotionally abused: Not on file    Physically abused: Not on file    Forced sexual activity: Not on file  Other  Topics Concern  . Not on file  Social History Narrative  . Not on file    Family History  Adopted: Yes  Problem Relation Age of Onset  . Colon cancer Neg Hx     No Known Allergies  Medication list reviewed and updated in full in [facility].   GEN: No acute illnesses, no fevers, chills. GI: No n/v/d, eating normally Pulm: No SOB Interactive and getting along well at home.  Otherwise, ROS is as per the HPI.  Objective:   BP 140/90   Pulse 64   Temp 98.4 F (36.9 C) (Oral)   Ht 5' 10.6" (1.793 m)   Wt 208 lb 8 oz (94.6 kg)   SpO2 98%   BMI 29.41 kg/m    GEN: WDWN, NAD, Non-toxic, A & O x 3 HEENT: Atraumatic, Normocephalic. Neck supple. No masses, No LAD. Ears and Nose: No external deformity. CV: RRR, No M/G/R. No JVD. No thrill. No extra heart sounds. PULM: CTA B, no wheezes, crackles, rhonchi. No retractions. No resp. distress. No accessory muscle use. EXTR: No c/c/e NEURO Normal gait.  PSYCH: Normally interactive. Conversant. Not depressed or anxious appearing.  Calm demeanor.   Laboratory and Imaging Data: Results for orders placed or performed in visit on 02/28/18  Basic metabolic panel  Result Value Ref Range   Sodium 134 (L) 135 - 145 mEq/L   Potassium 3.9 3.5 - 5.1 mEq/L   Chloride 97 96 - 112 mEq/L   CO2 31 19 - 32 mEq/L   Glucose, Bld 161 (H) 70 - 99 mg/dL   BUN 13 6 - 23 mg/dL   Creatinine, Ser 1.61 0.40 - 1.50 mg/dL   Calcium 9.2 8.4 - 09.6 mg/dL   GFR 04.54 >09.81 mL/min  CBC with Differential/Platelet  Result Value Ref Range   WBC 7.6 4.0 - 10.5 K/uL   RBC 5.49 4.22 - 5.81 Mil/uL   Hemoglobin 15.6 13.0 - 17.0 g/dL   HCT 19.1 47.8 - 29.5 %   MCV 83.6 78.0 - 100.0 fl   MCHC 33.9 30.0 - 36.0 g/dL   RDW 62.1 30.8 - 65.7 %   Platelets 133.0 (L) 150.0 - 400.0 K/uL   Neutrophils Relative % 54.7 43.0 - 77.0 %   Lymphocytes Relative 35.2 12.0 - 46.0 %   Monocytes Relative 8.0 3.0 - 12.0 %   Eosinophils Relative 0.9 0.0 - 5.0 %   Basophils  Relative 1.2 0.0 - 3.0 %   Neutro Abs 4.2 1.4 -  7.7 K/uL   Lymphs Abs 2.7 0.7 - 4.0 K/uL   Monocytes Absolute 0.6 0.1 - 1.0 K/uL   Eosinophils Absolute 0.1 0.0 - 0.7 K/uL   Basophils Absolute 0.1 0.0 - 0.1 K/uL  Hepatic function panel  Result Value Ref Range   Total Bilirubin 0.6 0.2 - 1.2 mg/dL   Bilirubin, Direct 0.1 0.0 - 0.3 mg/dL   Alkaline Phosphatase 68 39 - 117 U/L   AST 20 0 - 37 U/L   ALT 26 0 - 53 U/L   Total Protein 7.8 6.0 - 8.3 g/dL   Albumin 4.1 3.5 - 5.2 g/dL  Hemoglobin W0JA1c  Result Value Ref Range   Hgb A1c MFr Bld 8.4 (H) 4.6 - 6.5 %  Lipid panel  Result Value Ref Range   Cholesterol 182 0 - 200 mg/dL   Triglycerides 811.9176.0 (H) 0.0 - 149.0 mg/dL   HDL 14.7837.40 (L) >29.56>39.00 mg/dL   VLDL 21.335.2 0.0 - 08.640.0 mg/dL   LDL Cholesterol 578109 (H) 0 - 99 mg/dL   Total CHOL/HDL Ratio 5    NonHDL 144.62   PSA  Result Value Ref Range   PSA 0.26 0.10 - 4.00 ng/mL     Assessment and Plan:   Exertional chest pain - Plan: Ambulatory referral to Cardiology  Controlled type 2 diabetes mellitus without complication, without long-term current use of insulin (HCC) - Plan: Basic metabolic panel, Hemoglobin A1c, Lipid panel, Ambulatory referral to Cardiology  Hyperlipidemia associated with type 2 diabetes mellitus (HCC) - Plan: Ambulatory referral to Cardiology  Essential hypertension - Plan: Ambulatory referral to Cardiology  TOBACCO USE - Plan: Ambulatory referral to Cardiology  Screening PSA (prostate specific antigen) - Plan: PSA  Encounter for long-term current use of medication - Plan: CBC with Differential/Platelet, Hepatic function panel  Chest pain, unspecified type - Plan: EKG 12-Lead  EKG: Normal sinus rhythm. Normal axis, normal R wave progression. Q waves on anteroinferior leads present on prior EKG. No acute ST elevation or depression.   Chest pain on exertion is quite concerning.  He has multiple risk factors for potential cardiac disease, and we are going to try to  get him worked in to see cardiology on a urgent basis.  Thankfully they have been able to help with this.  At the time of this dictation, his labs have returned, and his diabetes is indeed out of control.  We will need to restart his medication.  He is done fine on metformin in the past.  Continue with hyperlipidemia, and we also may need to start his hypertensive medications back given that his diabetes is again back and not well controlled.  ACE inhibitor would be appropriate.  Certainly he should stop smoking.  Follow-up: based on labs  Orders Placed This Encounter  Procedures  . Basic metabolic panel  . CBC with Differential/Platelet  . Hepatic function panel  . Hemoglobin A1c  . Lipid panel  . PSA  . Ambulatory referral to Cardiology  . EKG 12-Lead    Signed,  Daryll Spisak T. Demaree Liberto, MD   Allergies as of 02/28/2018   No Known Allergies     Medication List        Accurate as of 02/28/18 11:59 PM. Always use your most recent med list.          ADVIL PO Take by mouth as needed.   atorvastatin 40 MG tablet Commonly known as:  LIPITOR Take 40 mg by mouth daily.   omeprazole 20 MG capsule Commonly  known as:  PRILOSEC TAKE ONE CAPSULE BY MOUTH TWICE A DAY

## 2018-03-01 LAB — BASIC METABOLIC PANEL
BUN: 13 mg/dL (ref 6–23)
CHLORIDE: 97 meq/L (ref 96–112)
CO2: 31 mEq/L (ref 19–32)
CREATININE: 0.95 mg/dL (ref 0.40–1.50)
Calcium: 9.2 mg/dL (ref 8.4–10.5)
GFR: 86.58 mL/min (ref >60.00)
Glucose, Bld: 161 mg/dL — ABNORMAL HIGH (ref 70–99)
Potassium: 3.9 mEq/L (ref 3.5–5.1)
SODIUM: 134 meq/L — AB (ref 135–145)

## 2018-03-01 LAB — CBC WITH DIFFERENTIAL/PLATELET
Basophils Absolute: 0.1 10*3/uL (ref 0.0–0.1)
Basophils Relative: 1.2 % (ref 0.0–3.0)
Eosinophils Absolute: 0.1 10*3/uL (ref 0.0–0.7)
Eosinophils Relative: 0.9 % (ref 0.0–5.0)
HCT: 45.9 % (ref 39.0–52.0)
Hemoglobin: 15.6 g/dL (ref 13.0–17.0)
LYMPHS ABS: 2.7 10*3/uL (ref 0.7–4.0)
LYMPHS PCT: 35.2 % (ref 12.0–46.0)
MCHC: 33.9 g/dL (ref 30.0–36.0)
MCV: 83.6 fl (ref 78.0–100.0)
Monocytes Absolute: 0.6 10*3/uL (ref 0.1–1.0)
Monocytes Relative: 8 % (ref 3.0–12.0)
NEUTROS PCT: 54.7 % (ref 43.0–77.0)
Neutro Abs: 4.2 10*3/uL (ref 1.4–7.7)
Platelets: 133 10*3/uL — ABNORMAL LOW (ref 150.0–400.0)
RBC: 5.49 Mil/uL (ref 4.22–5.81)
RDW: 13.7 % (ref 11.5–15.5)
WBC: 7.6 10*3/uL (ref 4.0–10.5)

## 2018-03-01 LAB — HEPATIC FUNCTION PANEL
ALT: 26 U/L (ref 0–53)
AST: 20 U/L (ref 0–37)
Albumin: 4.1 g/dL (ref 3.5–5.2)
Alkaline Phosphatase: 68 U/L (ref 39–117)
Bilirubin, Direct: 0.1 mg/dL (ref 0.0–0.3)
Total Bilirubin: 0.6 mg/dL (ref 0.2–1.2)
Total Protein: 7.8 g/dL (ref 6.0–8.3)

## 2018-03-01 LAB — LIPID PANEL
Cholesterol: 182 mg/dL (ref 0–200)
HDL: 37.4 mg/dL — AB (ref >39.00)
LDL Cholesterol: 109 mg/dL — ABNORMAL HIGH (ref 0–99)
NONHDL: 144.62
Total CHOL/HDL Ratio: 5
Triglycerides: 176 mg/dL — ABNORMAL HIGH (ref 0.0–149.0)
VLDL: 35.2 mg/dL (ref 0.0–40.0)

## 2018-03-01 LAB — HEMOGLOBIN A1C: HEMOGLOBIN A1C: 8.4 % — AB (ref 4.6–6.5)

## 2018-03-01 LAB — PSA: PSA: 0.26 ng/mL (ref 0.10–4.00)

## 2018-03-05 ENCOUNTER — Other Ambulatory Visit: Payer: Self-pay | Admitting: Family Medicine

## 2018-03-05 MED ORDER — METFORMIN HCL ER 500 MG PO TB24: 1000.0000 mg | ORAL_TABLET | Freq: Every day | ORAL | 1 refills | Status: DC

## 2018-03-05 MED ORDER — LISINOPRIL 20 MG PO TABS: 20.0000 mg | ORAL_TABLET | Freq: Every day | ORAL | 1 refills | Status: DC

## 2018-03-05 MED ORDER — ATORVASTATIN CALCIUM 40 MG PO TABS: 40.0000 mg | ORAL_TABLET | Freq: Every day | ORAL | 1 refills | Status: DC

## 2018-03-05 NOTE — Progress Notes (Signed)
   Meds ordered this encounter  Medications  . atorvastatin (LIPITOR) 40 MG tablet    Sig: Take 1 tablet (40 mg total) by mouth daily.    Dispense:  90 tablet    Refill:  1  . metFORMIN (GLUCOPHAGE-XR) 500 MG 24 hr tablet    Sig: Take 2 tablets (1,000 mg total) by mouth daily with breakfast.    Dispense:  180 tablet    Refill:  1  . lisinopril (PRINIVIL,ZESTRIL) 20 MG tablet    Sig: Take 1 tablet (20 mg total) by mouth daily.    Dispense:  90 tablet    Refill:  1    Signed,  Christobal Morado T. Harjot Dibello, MD   Allergies as of 03/05/2018   No Known Allergies     Medication List        Accurate as of 03/05/18  4:48 PM. Always use your most recent med list.          ADVIL PO Take by mouth as needed.   atorvastatin 40 MG tablet Commonly known as:  LIPITOR Take 1 tablet (40 mg total) by mouth daily.   lisinopril 20 MG tablet Commonly known as:  PRINIVIL,ZESTRIL Take 1 tablet (20 mg total) by mouth daily.   metFORMIN 500 MG 24 hr tablet Commonly known as:  GLUCOPHAGE-XR Take 2 tablets (1,000 mg total) by mouth daily with breakfast.   omeprazole 20 MG capsule Commonly known as:  PRILOSEC TAKE ONE CAPSULE BY MOUTH TWICE A DAY

## 2018-03-07 ENCOUNTER — Ambulatory Visit: Payer: 59 | Admitting: Cardiology

## 2018-03-07 ENCOUNTER — Encounter: Payer: Self-pay | Admitting: Cardiology

## 2018-03-07 ENCOUNTER — Ambulatory Visit (HOSPITAL_BASED_OUTPATIENT_CLINIC_OR_DEPARTMENT_OTHER)
Admission: RE | Admit: 2018-03-07 | Discharge: 2018-03-07 | Disposition: A | Discharge status: Home / Self Care | Payer: 59 | Source: Ambulatory Visit | Attending: Cardiology | Admitting: Cardiology

## 2018-03-07 VITALS — BP 130/94 | HR 71 | Ht 70.6 in | Wt 208.0 lb

## 2018-03-07 DIAGNOSIS — I209 Angina pectoris, unspecified: Secondary | ICD-10-CM

## 2018-03-07 DIAGNOSIS — E119 Type 2 diabetes mellitus without complications: Secondary | ICD-10-CM

## 2018-03-07 DIAGNOSIS — Z01818 Encounter for other preprocedural examination: Secondary | ICD-10-CM

## 2018-03-07 DIAGNOSIS — I214 Non-ST elevation (NSTEMI) myocardial infarction: Secondary | ICD-10-CM | POA: Diagnosis not present

## 2018-03-07 DIAGNOSIS — E785 Hyperlipidemia, unspecified: Secondary | ICD-10-CM

## 2018-03-07 DIAGNOSIS — R110 Nausea: Secondary | ICD-10-CM | POA: Diagnosis not present

## 2018-03-07 DIAGNOSIS — R0789 Other chest pain: Secondary | ICD-10-CM | POA: Diagnosis not present

## 2018-03-07 DIAGNOSIS — I10 Essential (primary) hypertension: Secondary | ICD-10-CM

## 2018-03-07 DIAGNOSIS — R9431 Abnormal electrocardiogram [ECG] [EKG]: Secondary | ICD-10-CM | POA: Diagnosis not present

## 2018-03-07 DIAGNOSIS — E1169 Type 2 diabetes mellitus with other specified complication: Secondary | ICD-10-CM

## 2018-03-07 DIAGNOSIS — R079 Chest pain, unspecified: Secondary | ICD-10-CM | POA: Insufficient documentation

## 2018-03-07 MED ORDER — NITROGLYCERIN 0.4 MG SL SUBL: 0.4000 mg | SUBLINGUAL_TABLET | SUBLINGUAL | 11 refills | Status: DC | PRN

## 2018-03-07 MED ORDER — ASPIRIN EC 81 MG PO TBEC: 81.0000 mg | DELAYED_RELEASE_TABLET | Freq: Every day | ORAL | 3 refills | Status: DC

## 2018-03-07 MED ORDER — METOPROLOL TARTRATE 25 MG PO TABS: 25.0000 mg | ORAL_TABLET | Freq: Two times a day (BID) | ORAL | 3 refills | Status: DC

## 2018-03-07 NOTE — Progress Notes (Signed)
Cardiology Office Note:    Date:  03/07/2018   ID:  [name], DOB [birth year], MRN [id number]  PCP:  Hannah Beat, MD  Cardiologist:  Norman Herrlich, MD   Referring MD: Hannah Beat, MD  ASSESSMENT:    1. Essential hypertension   2. Angina pectoris (HCC)   3. Controlled type 2 diabetes mellitus without complication, without long-term current use of insulin (HCC)   4. Hyperlipidemia associated with type 2 diabetes mellitus (HCC)   5. Abnormal EKG    PLAN:    In order of problems listed above:  1. He has an abnormal EKG consistent with old inferior wall myocardial infarction is having typical angina with class C symptoms that are starting to become unstable and will be referred for coronary angiography and optimize medical therapy adding aspirin beta-blocker PRN nitroglycerin.  The options benefits and risks were detailed with the patient and he is in agreement and understands 2. Stable hypertension continue ACE inhibitor 3. Stable managed by PCP continue Metformin 4. Stable continue high intensity statin 5. See #1 abnormal EKG implies preceding myocardial infarction.  This will be confirmed a coronary angiography and ventriculography  Next appointment 4 weeks   Medication Adjustments/Labs and Tests Ordered: Current medicines are reviewed at length with the patient today.  Concerns regarding medicines are outlined above.  Orders Placed This Encounter  Procedures  . EKG 12-Lead   No orders of the defined types were placed in this encounter.    Chief Complaint  Patient presents with  . Chest Pain    off and on    History of Present Illness:    Ronald Stephens is a 57 y.o. male who is being seen today for the evaluation of chest pain at the request of Copland, Karleen Hampshire, MD. His office EKG at PCP 02/28/18 showed no ischemic changes however he had a pattern of old inferior wall myocardial infarction confirmed on today's EKG  Is at high risk for CAD with  diabetes hypertension hyperlipidemia.  As a child he underwent a heart catheterization with concerns of congenital heart disease he had no correction performed. For the last year he has been having anginal discomfort substernal pressure with activity relieved with rest but in the last weeks the pattern is markedly accelerated.  In particular occurs in the mornings with little or no activity forces him to rest up to 5 minutes and then recovers during the day tends to occur with more physical effort and it resolves with just slowing down or taking a brief break.  He also has had episodes that awaken from his sleep and have occurred at rest.  He has class C angina pectoris and his symptoms are starting to become unstable with rest and nocturnal symptoms. For further evaluation he will undergo coronary angiography and likely percutaneous intervention.  If continue his current high intensity statin antihypertensive and metformin initiate low-dose aspirin beta-blocker and given a prescription for nitroglycerin.  He is compliant with medications and has no contraindication to dual antiplatelet therapy He describes his chest pain is pressure severe radiates in both upper extremities no jaw discomfort no associated shortness of breath diaphoresis nausea or vomiting.  The episodes occur with effort especially in the morning relieved with rest but is also had nocturnal awakening from sleep and rest episodes Past Medical History:  Diagnosis Date  . Arthritis   . Bilateral ankle fractures 2014  . Diabetes mellitus   . Heart murmur    as child  . Hyperlipidemia   .  Hypertension   . Right forearm fracture 2014  . Tobacco abuse     Past Surgical History:  Procedure Laterality Date  . CARDIAC CATHETERIZATION     as teen    Current Medications: Current Meds  Medication Sig  . atorvastatin (LIPITOR) 40 MG tablet Take 1 tablet (40 mg total) by mouth daily.  . Ibuprofen (ADVIL PO) Take by mouth as needed.  Marland Kitchen.  lisinopril (PRINIVIL,ZESTRIL) 20 MG tablet Take 1 tablet (20 mg total) by mouth daily.  . metFORMIN (GLUCOPHAGE-XR) 500 MG 24 hr tablet Take 2 tablets (1,000 mg total) by mouth daily with breakfast.  . omeprazole (PRILOSEC) 20 MG capsule TAKE ONE CAPSULE BY MOUTH TWICE A DAY     Allergies:   Patient has no known allergies.   Social History   Socioeconomic History  . Marital status: Married    Spouse name: Not on file  . Number of children: Not on file  . Years of education: Not on file  . Highest education level: Not on file  Occupational History  . Occupation: Merchandiser, retailsupervisor  Social Needs  . Financial resource strain: Not on file  . Food insecurity:    Worry: Not on file    Inability: Not on file  . Transportation needs:    Medical: Not on file    Non-medical: Not on file  Tobacco Use  . Smoking status: Current Every Day Smoker    Packs/day: 0.50    Years: 30.00    Pack years: 15.00    Types: Cigarettes  . Smokeless tobacco: Current User    Last attempt to quit: 03/05/2014  Substance and Sexual Activity  . Alcohol use: Yes    Alcohol/week: 0.0 oz    Comment: 5-6 beers a day   . Drug use: No  . Sexual activity: Not on file  Lifestyle  . Physical activity:    Days per week: Not on file    Minutes per session: Not on file  . Stress: Not on file  Relationships  . Social connections:    Talks on phone: Not on file    Gets together: Not on file    Attends religious service: Not on file    Active member of club or organization: Not on file    Attends meetings of clubs or organizations: Not on file    Relationship status: Not on file  Other Topics Concern  . Not on file  Social History Narrative  . Not on file     Family History: The patient's family history is negative for Colon cancer. He was adopted.  ROS:   Review of Systems  Constitution: Negative.  HENT: Negative.   Eyes: Negative.   Cardiovascular: Positive for chest pain.  Respiratory: Positive for  snoring (I might need a sleep test).   Endocrine: Negative.   Hematologic/Lymphatic: Negative.   Skin: Negative.   Musculoskeletal: Negative.   Gastrointestinal: Negative.   Genitourinary: Negative.   Neurological: Negative.   Psychiatric/Behavioral: Negative.   Allergic/Immunologic: Negative.    Please see the history of present illness.     All other systems reviewed and are negative.  EKGs/Labs/Other Studies Reviewed:    The following studies were reviewed today:   EKG:  EKG is  ordered today.  The ekg ordered today demonstrates sinus rhythm old inferior MI  Recent Labs: 02/28/2018: ALT 26; BUN 13; Creatinine, Ser 0.95; Hemoglobin 15.6; Platelets 133.0; Potassium 3.9; Sodium 134  Recent Lipid Panel    Component Value Date/Time  CHOL 182 02/28/2018 1444   TRIG 176.0 (H) 02/28/2018 1444   HDL 37.40 (L) 02/28/2018 1444   CHOLHDL 5 02/28/2018 1444   VLDL 35.2 02/28/2018 1444   LDLCALC 109 (H) 02/28/2018 1444   LDLDIRECT 174.5 06/05/2013 1214    Physical Exam:    VS:  BP (!) 130/94 (BP Location: Left Arm, Patient Position: Sitting, Cuff Size: Normal)   Pulse 71   Ht 5' 10.6" (1.793 m)   Wt 208 lb (94.3 kg)   SpO2 97%   BMI 29.34 kg/m     Wt Readings from Last 3 Encounters:  03/07/18 208 lb (94.3 kg)  02/28/18 208 lb 8 oz (94.6 kg)  03/06/15 186 lb 3.2 oz (84.5 kg)     GEN:  Well nourished, well developed in no acute distress HEENT: Normal NECK: No JVD; No carotid bruits LYMPHATICS: No lymphadenopathy CARDIAC: Full ulnar and radial pulse both arms RRR, no murmurs, rubs, gallops RESPIRATORY:  Clear to auscultation without rales, wheezing or rhonchi  ABDOMEN: Soft, non-tender, non-distended MUSCULOSKELETAL:  No edema; No deformity  SKIN: Warm and dry NEUROLOGIC:  Alert and oriented x 3 PSYCHIATRIC:  Normal affect     Signed, Norman Herrlich, MD  03/07/2018 2:06 PM     [facility]

## 2018-03-07 NOTE — Patient Instructions (Signed)
Medication Instructions:  Your physician has recommended you make the following change in your medication:   START: Nitroglycerin as needed for chest pain: When having chest pain, stop what you are doing and sit down. Take 1 nitro, wait 5 minutes. Still having chest pain, take 1 nitro, wait 5 minutes. Still having chest pain, take 1 nitro, dial 911. Total of 3 nitro in 15 minutes.  START: Aspirin 81 mg daily  START: Metoprolol tartrate (lopressor) 25 mg twice daily    Labwork: Your physician recommends that you return for lab work today: Troponin I   Testing/Procedures:  You had an ekg.   A chest x-ray takes a picture of the organs and structures inside the chest, including the heart, lungs, and blood vessels. This test can show several things, including, whether the heart is enlarges; whether fluid is building up in the lungs; and whether pacemaker / defibrillator leads are still in place.  Your physician has requested that you have a cardiac catheterization. Cardiac catheterization is used to diagnose and/or treat various heart conditions. Doctors may recommend this procedure for a number of different reasons. The most common reason is to evaluate chest pain. Chest pain can be a symptom of coronary artery disease (CAD), and cardiac catheterization can show whether plaque is narrowing or blocking your heart's arteries. This procedure is also used to evaluate the valves, as well as measure the blood flow and oxygen levels in different parts of your heart. For further information please visit [email or web address] Please follow instruction sheet, as given.       [facility] CARDIOVASCULAR DIVISION [facility] [place] [address] [place] [address] Dept: [phone] Loc: [phone]  Rilley Stash  03/07/2018  You are scheduled for a Cardiac Catheterization on Friday, August 2 with Dr. Cristal De[place] End.  1. Please arrive  at the [facility] (Main Entrance A) at [facility]: [address] [place], [place] [address] at 10:00 AM (This time is two hours before your procedure to ensure your preparation). Free valet parking service is available.   Special note: Every effort is made to have your procedure done on time. Please understand that emergencies sometimes delay scheduled procedures.  2. Diet: Do not eat solid foods after midnight.  The patient may have clear liquids until 5am upon the day of the procedure.  3. Labs: None needed.  4. Medication instructions in preparation for your procedure:   Contrast Allergy: No     Stop taking, Lisinopril (Zestril or Prinivil) on Thursday, August 1.   Stop taking ibuprofen on Thursday, August 1st.     Stop Taking PO Diabetes Meds Glucophage (Metformin) on Friday August 2nd.     On the morning of your procedure, take your Aspirin and any morning medicines NOT listed above.  You may use sips of water.  5. Plan for one night stay--bring personal belongings. 6. Bring a current list of your medications and current insurance cards. 7. You MUST have a responsible person to drive you home. 8. Someone MUST be with you the first 24 hours after you arrive home or your discharge will be delayed. 9. Please wear clothes that are easy to get on and off and wear slip-on shoes.  Thank you for allowing [place] to care for you!   -- [facility]  .  Follow-Up: Your physician recommends that you schedule a follow-up appointment in: 1 month.  Any Other Special Instructions Will  Be Listed Below (If Applicable).     If you need a refill on your cardiac medications before your next appointment, please call your pharmacy.    Coronary Angiogram With Stent Coronary angiogram with stent placement is a procedure to widen or open a narrow blood vessel of the heart (coronary artery). Arteries may become blocked by cholesterol buildup (plaques)  in the lining of the wall. When a coronary artery becomes partially blocked, blood flow to that area decreases. This may lead to chest pain or a heart attack (myocardial infarction). A stent is a small piece of metal that looks like mesh or a spring. Stent placement may be done as treatment for a heart attack or right after a coronary angiogram in which a blocked artery is found. Let your health care provider know about:  Any allergies you have.  All medicines you are taking, including vitamins, herbs, eye drops, creams, and over-the-counter medicines.  Any problems you or family members have had with anesthetic medicines.  Any blood disorders you have.  Any surgeries you have had.  Any medical conditions you have.  Whether you are pregnant or may be pregnant. What are the risks? Generally, this is a safe procedure. However, problems may occur, including:  Damage to the heart or its blood vessels.  A return of blockage.  Bleeding, infection, or bruising at the insertion site.  A collection of blood under the skin (hematoma) at the insertion site.  A blood clot in another part of the body.  Kidney injury.  Allergic reaction to the dye or contrast that is used.  Bleeding into the abdomen (retroperitoneal bleeding).  What happens before the procedure? Staying hydrated Follow instructions from your health care provider about hydration, which may include:  Up to 2 hours before the procedure - you may continue to drink clear liquids, such as water, clear fruit juice, black coffee, and plain tea.  Eating and drinking restrictions Follow instructions from your health care provider about eating and drinking, which may include:  8 hours before the procedure - stop eating heavy meals or foods such as meat, fried foods, or fatty foods.  6 hours before the procedure - stop eating light meals or foods, such as toast or cereal.  2 hours before the procedure - stop drinking clear  liquids.  Ask your health care provider about:  Changing or stopping your regular medicines. This is especially important if you are taking diabetes medicines or blood thinners.  Taking medicines such as ibuprofen. These medicines can thin your blood. Do not take these medicines before your procedure if your health care provider instructs you not to. Generally, aspirin is recommended before a procedure of passing a small, thin tube (catheter) through a blood vessel and into the heart (cardiac catheterization).  What happens during the procedure?  An IV tube will be inserted into one of your veins.  You will be given one or more of the following: ? A medicine to help you relax (sedative). ? A medicine to numb the area where the catheter will be inserted into an artery (local anesthetic).  To reduce your risk of infection: ? Your health care team will wash or sanitize their hands. ? Your skin will be washed with soap. ? Hair may be removed from the area where the catheter will be inserted.  Using a guide wire, the catheter will be inserted into an artery. The location may be in your groin, in your wrist, or in the fold  of your arm (near your elbow).  A type of X-ray (fluoroscopy) will be used to help guide the catheter to the opening of the arteries in the heart.  A dye will be injected into the catheter, and X-rays will be taken. The dye will help to show where any narrowing or blockages are located in the arteries.  A tiny wire will be guided to the blocked spot, and a balloon will be inflated to make the artery wider.  The stent will be expanded and will crush the plaques into the wall of the vessel. The stent will hold the area open and improve the blood flow. Most stents have a drug coating to reduce the risk of the stent narrowing over time.  The artery may be made wider using a drill, laser, or other tools to remove plaques.  When the blood flow is better, the catheter will be  removed. The lining of the artery will grow over the stent, which stays where it was placed. This procedure may vary among health care providers and hospitals. What happens after the procedure?  If the procedure is done through the leg, you will be kept in bed lying flat for about 6 hours. You will be instructed to not bend and not cross your legs.  The insertion site will be checked frequently.  The pulse in your foot or wrist will be checked frequently.  You may have additional blood tests, X-rays, and a test that records the electrical activity of your heart (electrocardiogram, or ECG). This information is not intended to replace advice given to you by your health care provider. Make sure you discuss any questions you have with your health care provider. Document Released: 02/05/2003 Document Revised: 03/31/2016 Document Reviewed: 03/06/2016 Elsevier Interactive Patient Education  2018 [employer].  Nitroglycerin sublingual tablets What is this medicine? NITROGLYCERIN (nye troe GLI ser in) is a type of vasodilator. It relaxes blood vessels, increasing the blood and oxygen supply to your heart. This medicine is used to relieve chest pain caused by angina. It is also used to prevent chest pain before activities like climbing stairs, going outdoors in cold weather, or sexual activity. This medicine may be used for other purposes; ask your health care provider or pharmacist if you have questions. COMMON BRAND NAME(S): Nitroquick, Nitrostat, Nitrotab What should I tell my health care provider before I take this medicine? They need to know if you have any of these conditions: -anemia -head injury, recent stroke, or bleeding in the brain -liver disease -previous heart attack -an unusual or allergic reaction to nitroglycerin, other medicines, foods, dyes, or preservatives -pregnant or trying to get pregnant -breast-feeding How should I use this medicine? Take this medicine by mouth as  needed. At the first sign of an angina attack (chest pain or tightness) place one tablet under your tongue. You can also take this medicine 5 to 10 minutes before an event likely to produce chest pain. Follow the directions on the prescription label. Let the tablet dissolve under the tongue. Do not swallow whole. Replace the dose if you accidentally swallow it. It will help if your mouth is not dry. Saliva around the tablet will help it to dissolve more quickly. Do not eat or drink, smoke or chew tobacco while a tablet is dissolving. If you are not better within 5 minutes after taking ONE dose of nitroglycerin, call 9-1-1 immediately to seek emergency medical care. Do not take more than 3 nitroglycerin tablets over 15 minutes. If  you take this medicine often to relieve symptoms of angina, your doctor or health care professional may provide you with different instructions to manage your symptoms. If symptoms do not go away after following these instructions, it is important to call 9-1-1 immediately. Do not take more than 3 nitroglycerin tablets over 15 minutes. Talk to your pediatrician regarding the use of this medicine in children. Special care may be needed. Overdosage: If you think you have taken too much of this medicine contact a poison control center or emergency room at once. NOTE: This medicine is only for you. Do not share this medicine with others. What if I miss a dose? This does not apply. This medicine is only used as needed. What may interact with this medicine? Do not take this medicine with any of the following medications: -certain migraine medicines like ergotamine and dihydroergotamine (DHE) -medicines used to treat erectile dysfunction like sildenafil, tadalafil, and vardenafil -riociguat This medicine may also interact with the following medications: -alteplase -aspirin -heparin -medicines for high blood pressure -medicines for mental depression -other medicines used to treat  angina -phenothiazines like chlorpromazine, mesoridazine, prochlorperazine, thioridazine This list may not describe all possible interactions. Give your health care provider a list of all the medicines, herbs, non-prescription drugs, or dietary supplements you use. Also tell them if you smoke, drink alcohol, or use illegal drugs. Some items may interact with your medicine. What should I watch for while using this medicine? Tell your doctor or health care professional if you feel your medicine is no longer working. Keep this medicine with you at all times. Sit or lie down when you take your medicine to prevent falling if you feel dizzy or faint after using it. Try to remain calm. This will help you to feel better faster. If you feel dizzy, take several deep breaths and lie down with your feet propped up, or bend forward with your head resting between your knees. You may get drowsy or dizzy. Do not drive, use machinery, or do anything that needs mental alertness until you know how this drug affects you. Do not stand or sit up quickly, especially if you are an older patient. This reduces the risk of dizzy or fainting spells. Alcohol can make you more drowsy and dizzy. Avoid alcoholic drinks. Do not treat yourself for coughs, colds, or pain while you are taking this medicine without asking your doctor or health care professional for advice. Some ingredients may increase your blood pressure. What side effects may I notice from receiving this medicine? Side effects that you should report to your doctor or health care professional as soon as possible: -blurred vision -dry mouth -skin rash -sweating -the feeling of extreme pressure in the head -unusually weak or tired Side effects that usually do not require medical attention (report to your doctor or health care professional if they continue or are bothersome): -flushing of the face or neck -headache -irregular heartbeat, palpitations -nausea,  vomiting This list may not describe all possible side effects. Call your doctor for medical advice about side effects. You may report side effects to FDA at 1-800-FDA-1088. Where should I keep my medicine? Keep out of the reach of children. Store at room temperature between 20 and 25 degrees C (68 and 77 degrees F). Store in Retail buyer. Protect from light and moisture. Keep tightly closed. Throw away any unused medicine after the expiration date. NOTE: This sheet is a summary. It may not cover all possible information. If you have questions  about this medicine, talk to your doctor, pharmacist, or health care provider.  2018 Elsevier/Gold Standard (2013-05-30 17:57:36)  Metoprolol tablets What is this medicine? METOPROLOL (me TOE proe lole) is a beta-blocker. Beta-blockers reduce the workload on the heart and help it to beat more regularly. This medicine is used to treat high blood pressure and to prevent chest pain. It is also used to after a heart attack and to prevent an additional heart attack from occurring. This medicine may be used for other purposes; ask your health care provider or pharmacist if you have questions. COMMON BRAND NAME(S): Lopressor What should I tell my health care provider before I take this medicine? They need to know if you have any of these conditions: -diabetes -heart or vessel disease like slow heart rate, worsening heart failure, heart block, sick sinus syndrome or Raynaud's disease -kidney disease -liver disease -lung or breathing disease, like asthma or emphysema -pheochromocytoma -thyroid disease -an unusual or allergic reaction to metoprolol, other beta-blockers, medicines, foods, dyes, or preservatives -pregnant or trying to get pregnant -breast-feeding How should I use this medicine? Take this medicine by mouth with a drink of water. Follow the directions on the prescription label. Take this medicine immediately after meals. Take your doses at  regular intervals. Do not take more medicine than directed. Do not stop taking this medicine suddenly. This could lead to serious heart-related effects. Talk to your pediatrician regarding the use of this medicine in children. Special care may be needed. Overdosage: If you think you have taken too much of this medicine contact a poison control center or emergency room at once. NOTE: This medicine is only for you. Do not share this medicine with others. What if I miss a dose? If you miss a dose, take it as soon as you can. If it is almost time for your next dose, take only that dose. Do not take double or extra doses. What may interact with this medicine? This medicine may interact with the following medications: -certain medicines for blood pressure, heart disease, irregular heart beat -certain medicines for depression like monoamine oxidase (MAO) inhibitors, fluoxetine, or paroxetine -clonidine -dobutamine -epinephrine -isoproterenol -reserpine This list may not describe all possible interactions. Give your health care provider a list of all the medicines, herbs, non-prescription drugs, or dietary supplements you use. Also tell them if you smoke, drink alcohol, or use illegal drugs. Some items may interact with your medicine. What should I watch for while using this medicine? Visit your doctor or health care professional for regular check ups. Contact your doctor right away if your symptoms worsen. Check your blood pressure and pulse rate regularly. Ask your health care professional what your blood pressure and pulse rate should be, and when you should contact them. You may get drowsy or dizzy. Do not drive, use machinery, or do anything that needs mental alertness until you know how this medicine affects you. Do not sit or stand up quickly, especially if you are an older patient. This reduces the risk of dizzy or fainting spells. Contact your doctor if these symptoms continue. Alcohol may  interfere with the effect of this medicine. Avoid alcoholic drinks. What side effects may I notice from receiving this medicine? Side effects that you should report to your doctor or health care professional as soon as possible: -allergic reactions like skin rash, itching or hives -cold or numb hands or feet -depression -difficulty breathing -faint -fever with sore throat -irregular heartbeat, chest pain -rapid weight gain -swollen  legs or ankles Side effects that usually do not require medical attention (report to your doctor or health care professional if they continue or are bothersome): -anxiety or nervousness -change in sex drive or performance -dry skin -headache -nightmares or trouble sleeping -short term memory loss -stomach upset or diarrhea -unusually tired This list may not describe all possible side effects. Call your doctor for medical advice about side effects. You may report side effects to FDA at 1-800-FDA-1088. Where should I keep my medicine? Keep out of the reach of children. Store at room temperature between 15 and 30 degrees C (59 and 86 degrees F). Throw away any unused medicine after the expiration date. NOTE: This sheet is a summary. It may not cover all possible information. If you have questions about this medicine, talk to your doctor, pharmacist, or health care provider.  2018 Elsevier/Gold Standard (2013-04-05 14:40:36)  Aspirin and Your Heart Aspirin is a medicine that affects the way blood clots. Aspirin can be used to help reduce the risk of blood clots, heart attacks, and other heart-related problems. Should I take aspirin? Your health care provider will help you determine whether it is safe and beneficial for you to take aspirin daily. Taking aspirin daily may be beneficial if you:  Have had a heart attack or chest pain.  Have undergone open heart surgery such as coronary artery bypass surgery (CABG).  Have had coronary angioplasty.  Have  experienced a stroke or transient ischemic attack (TIA).  Have peripheral vascular disease (PVD).  Have chronic heart rhythm problems such as atrial fibrillation.  Are there any risks of taking aspirin daily? Daily use of aspirin can increase your risk of side effects. Some of these include:  Bleeding. Bleeding problems can be minor or serious. An example of a minor problem is a cut that does not stop bleeding. An example of a more serious problem is stomach bleeding or bleeding into the brain. Your risk of bleeding is increased if you are also taking non-steroidal anti-inflammatory medicine (NSAIDs).  Increased bruising.  Upset stomach.  An allergic reaction. People who have nasal polyps have an increased risk of developing an aspirin allergy.  What are some guidelines I should follow when taking aspirin?  Take aspirin only as directed by your health care provider. Make sure you understand how much you should take and what form you should take. The two forms of aspirin are: ? Non-enteric-coated. This type of aspirin does not have a coating and is absorbed quickly. Non-enteric-coated aspirin is usually recommended for people with chest pain. This type of aspirin also comes in a chewable form. ? Enteric-coated. This type of aspirin has a special coating that releases the medicine very slowly. Enteric-coated aspirin causes less stomach upset than non-enteric-coated aspirin. This type of aspirin should not be chewed or crushed.  Drink alcohol in moderation. Drinking alcohol increases your risk of bleeding. When should I seek medical care?  You have unusual bleeding or bruising.  You have stomach pain.  You have an allergic reaction. Symptoms of an allergic reaction include: ? Hives. ? Itchy skin. ? Swelling of the lips, tongue, or face.  You have ringing in your ears. When should I seek immediate medical care?  Your bowel movements are bloody, dark red, or black in color.  You  vomit or cough up blood.  You have blood in your urine.  You cough, wheeze, or feel short of breath. If you have any of the following symptoms, this is an  emergency. Do not wait to see if the pain will go away. Get medical help at once. Call your local emergency services (911 in the [place] Do not drive yourself to the hospital.  You have severe chest pain, especially if the pain is crushing or pressure-like and spreads to the arms, back, neck, or jaw.  You have stroke-like symptoms, such as: ? Loss of vision. ? Difficulty talking. ? Numbness or weakness on one side of your body. ? Numbness or weakness in your arm or leg. ? Not thinking clearly or feeling confused.  This information is not intended to replace advice given to you by your health care provider. Make sure you discuss any questions you have with your health care provider. Document Released: 07/14/2008 Document Revised: 12/09/2015 Document Reviewed: 11/06/2013 Elsevier Interactive Patient Education  2018 [employer]

## 2018-03-08 ENCOUNTER — Emergency Department (HOSPITAL_COMMUNITY): Payer: 59

## 2018-03-08 ENCOUNTER — Telehealth: Payer: Self-pay | Admitting: Cardiology

## 2018-03-08 ENCOUNTER — Encounter (HOSPITAL_COMMUNITY): Payer: Self-pay

## 2018-03-08 ENCOUNTER — Inpatient Hospital Stay (HOSPITAL_COMMUNITY)
Admission: EM | Admit: 2018-03-08 | Discharge: 2018-03-17 | DRG: 234 | Disposition: A | Discharge status: Home / Self Care | Payer: 59 | Attending: Cardiothoracic Surgery | Admitting: Cardiothoracic Surgery

## 2018-03-08 ENCOUNTER — Other Ambulatory Visit: Payer: Self-pay

## 2018-03-08 DIAGNOSIS — I10 Essential (primary) hypertension: Secondary | ICD-10-CM | POA: Diagnosis not present

## 2018-03-08 DIAGNOSIS — E8770 Fluid overload, unspecified: Secondary | ICD-10-CM | POA: Diagnosis not present

## 2018-03-08 DIAGNOSIS — Z951 Presence of aortocoronary bypass graft: Secondary | ICD-10-CM

## 2018-03-08 DIAGNOSIS — I25110 Atherosclerotic heart disease of native coronary artery with unstable angina pectoris: Secondary | ICD-10-CM | POA: Diagnosis present

## 2018-03-08 DIAGNOSIS — R079 Chest pain, unspecified: Secondary | ICD-10-CM | POA: Diagnosis not present

## 2018-03-08 DIAGNOSIS — I2510 Atherosclerotic heart disease of native coronary artery without angina pectoris: Secondary | ICD-10-CM | POA: Diagnosis present

## 2018-03-08 DIAGNOSIS — Z7982 Long term (current) use of aspirin: Secondary | ICD-10-CM

## 2018-03-08 DIAGNOSIS — J449 Chronic obstructive pulmonary disease, unspecified: Secondary | ICD-10-CM | POA: Diagnosis present

## 2018-03-08 DIAGNOSIS — I4892 Unspecified atrial flutter: Secondary | ICD-10-CM | POA: Diagnosis not present

## 2018-03-08 DIAGNOSIS — R0789 Other chest pain: Secondary | ICD-10-CM | POA: Diagnosis not present

## 2018-03-08 DIAGNOSIS — I209 Angina pectoris, unspecified: Secondary | ICD-10-CM

## 2018-03-08 DIAGNOSIS — E119 Type 2 diabetes mellitus without complications: Secondary | ICD-10-CM | POA: Diagnosis not present

## 2018-03-08 DIAGNOSIS — I214 Non-ST elevation (NSTEMI) myocardial infarction: Principal | ICD-10-CM

## 2018-03-08 DIAGNOSIS — E1169 Type 2 diabetes mellitus with other specified complication: Secondary | ICD-10-CM | POA: Diagnosis present

## 2018-03-08 DIAGNOSIS — Z09 Encounter for follow-up examination after completed treatment for conditions other than malignant neoplasm: Secondary | ICD-10-CM

## 2018-03-08 DIAGNOSIS — Z79899 Other long term (current) drug therapy: Secondary | ICD-10-CM

## 2018-03-08 DIAGNOSIS — I25119 Atherosclerotic heart disease of native coronary artery with unspecified angina pectoris: Secondary | ICD-10-CM

## 2018-03-08 DIAGNOSIS — R110 Nausea: Secondary | ICD-10-CM | POA: Diagnosis not present

## 2018-03-08 DIAGNOSIS — Z7984 Long term (current) use of oral hypoglycemic drugs: Secondary | ICD-10-CM

## 2018-03-08 DIAGNOSIS — D62 Acute posthemorrhagic anemia: Secondary | ICD-10-CM | POA: Diagnosis not present

## 2018-03-08 DIAGNOSIS — E785 Hyperlipidemia, unspecified: Secondary | ICD-10-CM | POA: Diagnosis present

## 2018-03-08 DIAGNOSIS — F17210 Nicotine dependence, cigarettes, uncomplicated: Secondary | ICD-10-CM | POA: Diagnosis present

## 2018-03-08 DIAGNOSIS — D696 Thrombocytopenia, unspecified: Secondary | ICD-10-CM | POA: Diagnosis not present

## 2018-03-08 DIAGNOSIS — I4891 Unspecified atrial fibrillation: Secondary | ICD-10-CM | POA: Diagnosis not present

## 2018-03-08 LAB — BASIC METABOLIC PANEL
ANION GAP: 9 (ref 5–15)
BUN: 8 mg/dL (ref 6–20)
CHLORIDE: 101 mmol/L (ref 98–111)
CO2: 27 mmol/L (ref 22–32)
Calcium: 9.5 mg/dL (ref 8.9–10.3)
Creatinine, Ser: 0.96 mg/dL (ref 0.61–1.24)
GFR calc Af Amer: >60 mL/min (ref >60)
GFR calc non Af Amer: >60 mL/min (ref >60)
GLUCOSE: 160 mg/dL — AB (ref 70–99)
POTASSIUM: 3.9 mmol/L (ref 3.5–5.1)
Sodium: 137 mmol/L (ref 135–145)

## 2018-03-08 LAB — CBC
HEMATOCRIT: 49.4 % (ref 39.0–52.0)
HEMOGLOBIN: 16.1 g/dL (ref 13.0–17.0)
MCH: 27.4 pg (ref 26.0–34.0)
MCHC: 32.6 g/dL (ref 30.0–36.0)
MCV: 84 fL (ref 78.0–100.0)
Platelets: 166 10*3/uL (ref 150–400)
RBC: 5.88 MIL/uL — AB (ref 4.22–5.81)
RDW: 13 % (ref 11.5–15.5)
WBC: 9.2 10*3/uL (ref 4.0–10.5)

## 2018-03-08 LAB — I-STAT TROPONIN, ED: Troponin i, poc: 0.01 ng/mL (ref 0.00–0.08)

## 2018-03-08 LAB — TROPONIN I: TROPONIN I: 0.08 ng/mL — AB (ref 0.00–0.04)

## 2018-03-08 MED ORDER — ACETAMINOPHEN 325 MG PO TABS
650.0000 mg | ORAL_TABLET | Freq: Once | ORAL | Status: AC
  Administered 2018-03-08: 650 mg via ORAL
  Filled 2018-03-08: qty 2

## 2018-03-08 NOTE — Telephone Encounter (Signed)
Opened in error

## 2018-03-08 NOTE — ED Provider Notes (Signed)
MSE was initiated and I personally evaluated the patient and placed orders (if any) at  5:03 PM on March 08, 2018.  The patient appears stable so that the remainder of the MSE may be completed by another provider.  Patient placed in Quick Look pathway, seen and evaluated   Chief Complaint: abnormal lab (elevated troponin)  HPI:   57 year old male with past medical history of hypertension, hyperlipidemia, tobacco abuse who presents to ED for evaluation of abnormal lab work.  He was sent by his cardiologist for elevated troponin.  He had an office visit with cardiology yesterday.  He denies any current chest pain but states that he has had intermittent chest pain radiating down both arms for the past month.  Denies any shortness of breath, prior MI.  ROS: chest pain [id number]  Physical Exam:   Gen: No distress  Neuro: Awake and Alert  Skin: Warm    Focused Exam: Lungs CTAB. RRR.  Reviewed lab work in epic shows troponin I of 0.08 from yesterday.  Informed RN that patient needs next room ASAP. Will obtain repeat labs, EKG.  Initiation of care has begun. The patient has been counseled on the process, plan, and necessity for staying for the completion/evaluation, and the remainder of the medical screening examination    Dietrich PatesKhatri, Davida Falconi, PA-C 03/08/18 1705    Jacalyn LefevreHaviland, Julie, MD 03/08/18 1735

## 2018-03-08 NOTE — H&P (Signed)
Cardiology Admission History and Physical:   Patient ID: Ronald Stephens; MRN: [id number]; DOB: 03/30/1961   Admission date: 03/08/2018  Primary Care Provider: Hannah Beatopland, Spencer, MD Primary Cardiologist: Norman HerrlichBrian Munley, MD  Primary Electrophysiologist:  none  Chief Complaint:  MI    History of Present Illness:   Ronald Stephens is a 57 year old male with old inferior wall MI on ECG with diabetes, hypertension, hyperlipidemia who as a child underwent heart catheterization after concerns of congenital heart disease but no correction needed to be performed who has been experiencing anginal discomfort substernal activity relieved with rest accelerated.  In review of Dr. Hulen ShoutsMunley's note it it particularly occurs in the morning and forces him to rest up to 5 minutes.  Has to take breaks.  Severe.  Radiates to both upper extremities, no jaw discomfort, no shortness of breath.  Troponin was drawn, and was abnormal 0.08.  He is now here to be admitted for heart catheterization.  He continues to smoke.  States that he has a new girlfriend that does not like this.  He is willing to quit.  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 Busy he states.    Past Medical History:  Diagnosis Date  . Arthritis   . Bilateral ankle fractures 2014  . Diabetes mellitus   . Heart murmur    as child  . Hyperlipidemia   . Hypertension   . Right forearm fracture 2014  . Tobacco abuse     Past Surgical History:  Procedure Laterality Date  . CARDIAC CATHETERIZATION     as teen     Medications Prior to Admission: Prior to Admission medications   Medication Sig Start Date End Date Taking? Authorizing Provider  aspirin EC 81 MG tablet Take 1 tablet (81 mg total) by mouth daily. 03/07/18   Baldo DaubMunley, Brian J, MD  atorvastatin (LIPITOR) 40 MG tablet Take 1 tablet (40 mg total) by mouth daily. 03/05/18   Copland, Karleen HampshireSpencer, MD  Ibuprofen (ADVIL PO) Take by mouth as needed.    [provider]  lisinopril (PRINIVIL,ZESTRIL) 20 MG tablet Take 1 tablet (20 mg total) by mouth daily. 03/05/18   Copland, Karleen HampshireSpencer, MD  metFORMIN (GLUCOPHAGE-XR) 500 MG 24 hr tablet Take 2 tablets (1,000 mg total) by mouth daily with breakfast. 03/05/18   Copland, Karleen HampshireSpencer, MD  metoprolol tartrate (LOPRESSOR) 25 MG tablet Take 1 tablet (25 mg total) by mouth 2 (two) times daily. 03/07/18 06/05/18  Baldo DaubMunley, Brian J, MD  nitroGLYCERIN (NITROSTAT) 0.4 MG SL tablet Place 1 tablet (0.4 mg total) under the tongue every 5 (five) minutes as needed for chest pain. 03/07/18 06/05/18  Baldo DaubMunley, Brian J, MD  omeprazole (PRILOSEC) 20 MG capsule TAKE ONE CAPSULE BY MOUTH TWICE A DAY 11/20/15   Anice PaganiniGill, Eric A, NP     Allergies:   No Known Allergies  Social History:   Social History   Socioeconomic History  . Marital status: Married    Spouse name: Not on file  . Number of children: Not on file  . Years of education: Not on file  . Highest education level: Not on file  Occupational History  . Occupation: Merchandiser, retailsupervisor  Social Needs  . Financial resource strain: Not on file  . Food insecurity:    Worry: Not on file    Inability: Not on file  . Transportation needs:    Medical: Not on file    Non-medical: Not on file  Tobacco Use  . Smoking status: Current Every Day  Smoker    Packs/day: 0.50    Years: 30.00    Pack years: 15.00    Types: Cigarettes  . Smokeless tobacco: Current User    Last attempt to quit: 03/05/2014  Substance and Sexual Activity  . Alcohol use: Yes    Alcohol/week: 0.0 oz    Comment: 2-3 beers per day  . Drug use: No  . Sexual activity: Not on file  Lifestyle  . Physical activity:    Days per week: Not on file    Minutes per session: Not on file  . Stress: Not on file  Relationships  . Social connections:    Talks on phone: Not on file    Gets together: Not on file    Attends religious service: Not on file    Active member of club or organization: Not on file    Attends  meetings of clubs or organizations: Not on file    Relationship status: Not on file  . Intimate partner violence:    Fear of current or ex partner: Not on file    Emotionally abused: Not on file    Physically abused: Not on file    Forced sexual activity: Not on file  Other Topics Concern  . Not on file  Social History Narrative  . Not on file    Family History:   The patient's family history is negative for Colon cancer. He was adopted.    ROS:  Please see the history of present illness.  Denies any fevers chills nausea vomiting syncope bleeding all other ROS reviewed and negative.     Physical Exam/Data:   Vitals:   03/08/18 1656 03/08/18 1657 03/08/18 1730  BP: 134/84  (!) 135/92  Pulse: 73  77  Resp: 16  (!) 22  Temp: 98.3 F (36.8 C)    TempSrc: Oral    SpO2: 98%  95%  Weight:  208 lb (94.3 kg)   Height:  5' 10.5" (1.791 m)    No intake or output data in the 24 hours ending 03/08/18 1804 Filed Weights   03/08/18 1657  Weight: 208 lb (94.3 kg)   Body mass index is 29.42 kg/m.  General:  Well nourished, well developed, in no acute distress HEENT: normal Lymph: no adenopathy Neck: no JVD Endocrine:  No thryomegaly Vascular: No carotid bruits; FA pulses 2+ bilaterally without bruits  Cardiac:  normal S1, S2; RRR; no murmur  Lungs:  clear to auscultation bilaterally, no wheezing, rhonchi or rales  Abd: soft, nontender, no hepatomegaly  Ext: no edema Musculoskeletal:  No deformities, BUE and BLE strength normal and equal Skin: warm and dry  Neuro:  CNs 2-12 intact, no focal abnormalities noted Psych:  Normal affect    EKG:  The ECG that was done  was personally reviewed and demonstrates sinus rhythm with inferior infarct pattern  Relevant CV Studies: Troponin mildly abnormal 0.08  Laboratory Data:  ChemistryNo results for input(s): NA, K, CL, CO2, GLUCOSE, BUN, CREATININE, CALCIUM, GFRNONAA, GFRAA, ANIONGAP in the last 168 hours.  No results for  input(s): PROT, ALBUMIN, AST, ALT, ALKPHOS, BILITOT in the last 168 hours. Hematology Recent Labs  Lab 03/08/18 1657  WBC 9.2  RBC 5.88*  HGB 16.1  HCT 49.4  MCV 84.0  MCH 27.4  MCHC 32.6  RDW 13.0  PLT 166   Cardiac Enzymes Recent Labs  Lab 03/07/18 1430  TROPONINI 0.08*    Recent Labs  Lab 03/08/18 1714  TROPIPOC 0.01  BNPNo results for input(s): BNP, PROBNP in the last 168 hours.  DDimer No results for input(s): DDIMER in the last 168 hours.  Radiology/Studies:  No results found.  Assessment and Plan:   Non-ST elevation myocardial infarction - IV heparin, beta-blocker, high intensity statin, cardiac catheterization tomorrow.  Risks and benefits described including stroke heart attack death renal impairment bleeding.  Willing to proceed.  Diabetes with hypertension -Holding metformin.  Tobacco use  - cessation.   Severity of Illness: The appropriate patient status for this patient is INPATIENT. Inpatient status is judged to be reasonable and necessary in order to provide the required intensity of service to ensure the patient's safety. The patient's presenting symptoms, physical exam findings, and initial radiographic and laboratory data in the context of their chronic comorbidities is felt to place them at high risk for further clinical deterioration. Furthermore, it is not anticipated that the patient will be medically stable for discharge from the hospital within 2 midnights of admission. The following factors support the patient status of inpatient.   " The patient's presenting symptoms include MI, CP. " The worrisome physical exam findings include anxious during MI. " The initial radiographic and laboratory data are worrisome because of Elevated Trop, MI. " The chronic co-morbidities include DM.   * I certify that at the point of admission it is my clinical judgment that the patient will require inpatient hospital care spanning beyond 2 midnights from the  point of admission due to high intensity of service, high risk for further deterioration and high frequency of surveillance required.*    For questions or updates, please contact [facility] Please consult [email or web address] for contact info under Cardiology/STEMI.    Signed, Donato Schultz, MD  03/08/2018 6:04 PM

## 2018-03-08 NOTE — Telephone Encounter (Signed)
Received call from [facility] regarding critical lab, Trop 0.08 that was drawn on this patient of Dr. Hulen ShoutsMunley's yesterday afternoon. Message sent to Dr. Dulce SellarMunley regarding lab information to determine plan of care. Appears patient has been setup for outpatient cath prior to obtaining this from visit yesterday.    Laverda PageLindsay Jahking Lesser NP

## 2018-03-08 NOTE — ED Triage Notes (Signed)
Pt endorses being sent by cardiologist for abnormal lab work. Pt saw cards yesterday for CP with radiation down both arms intermittently x 1 year, worse over the last month with occasional nausea. Denies shob or dizziness. VSS

## 2018-03-08 NOTE — ED Provider Notes (Signed)
[facility] EMERGENCY DEPARTMENT Provider Note  CSN: [id number] Arrival date & time: 03/08/18  1637  History   Chief Complaint Chief Complaint  Patient presents with  . Chest Pain  . Abnormal Lab   HPI Ronald Stephens is a 57 y.o. male with a medical history of MI by EKG, Type 2 DM, HTN, HLD,  who presented to the ED via cardiology for elevated troponin and chest pain x1 year. Patient reports chest pain that is worse with exertion and relieved with rest. It is described as pressure. This has worsened over the last year and now occurs with minimal exertion. Associated symptoms: radiating pain down arms bilaterally and nausea. Denies SOB, jaw pain, palpitations, diaphoresis, fatigue or weakness. Patient has tried nothing prior to coming to the ED.  Additional history obtained by medical chart. Patient seen by cardiologist, Dr. Norman Herrlich, yesterday 03/07/18. It was noted that his EKG was consistent with old inferior MI. No acute or new ischemic or other abnormalities seen. A coronary angiography was scheduled for 03/16/18 and patient sent home with ASA, beta blocker and PRN nitro.  Past Medical History:  Diagnosis Date  . Arthritis   . Bilateral ankle fractures 2014  . Diabetes mellitus   . Heart murmur    as child  . Hyperlipidemia   . Hypertension   . Right forearm fracture 2014  . Tobacco abuse     Patient Active Problem List   Diagnosis Date Noted  . Chest pain 03/07/2018  . Abnormal EKG 03/07/2018  . Dyspepsia 03/06/2015  . Controlled type 2 diabetes mellitus without complication, without long-term current use of insulin (HCC) 04/22/2009  . Hyperlipidemia associated with type 2 diabetes mellitus (HCC) 04/10/2009  . Essential hypertension 03/14/2009  . OSTEOARTHRITIS 03/14/2009  . TOBACCO USE 03/13/2009    Past Surgical History:  Procedure Laterality Date  . CARDIAC CATHETERIZATION     as teen        Home Medications    Prior to Admission  medications   Medication Sig Start Date End Date Taking? Authorizing Provider  aspirin EC 81 MG tablet Take 1 tablet (81 mg total) by mouth daily. 03/07/18  Yes Baldo Daub, MD  atorvastatin (LIPITOR) 40 MG tablet Take 1 tablet (40 mg total) by mouth daily. 03/05/18  Yes Copland, Karleen Hampshire, MD  ibuprofen (ADVIL) 200 MG tablet Take 800 mg by mouth daily. Shoulder pain   Yes [provider]  lisinopril (PRINIVIL,ZESTRIL) 20 MG tablet Take 1 tablet (20 mg total) by mouth daily. 03/05/18  Yes Copland, Karleen Hampshire, MD  metFORMIN (GLUCOPHAGE-XR) 500 MG 24 hr tablet Take 2 tablets (1,000 mg total) by mouth daily with breakfast. 03/05/18  Yes Copland, Karleen Hampshire, MD  metoprolol tartrate (LOPRESSOR) 25 MG tablet Take 1 tablet (25 mg total) by mouth 2 (two) times daily. 03/07/18 06/05/18 Yes Baldo Daub, MD  Multiple Vitamins-Minerals (CENTRUM SILVER 50+MEN PO) Take 1 tablet by mouth daily.   Yes [provider]  nitroGLYCERIN (NITROSTAT) 0.4 MG SL tablet Place 1 tablet (0.4 mg total) under the tongue every 5 (five) minutes as needed for chest pain. 03/07/18 06/05/18 Yes Baldo Daub, MD  omeprazole (PRILOSEC) 20 MG capsule TAKE ONE CAPSULE BY MOUTH TWICE A DAY 11/20/15  Yes Anice Paganini, NP    Family History Family History  Adopted: Yes  Problem Relation Age of Onset  . Colon cancer Neg Hx     Social History Social History   Tobacco Use  .  Smoking status: Current Every Day Smoker    Packs/day: 0.50    Years: 30.00    Pack years: 15.00    Types: Cigarettes  . Smokeless tobacco: Current User    Last attempt to quit: 03/05/2014  Substance Use Topics  . Alcohol use: Yes    Alcohol/week: 0.0 oz    Comment: 2-3 beers per day  . Drug use: No     Allergies   Patient has no known allergies.   Review of Systems Review of Systems  Constitutional: Negative for diaphoresis and fatigue.  HENT: Negative.   Eyes: Negative.   Respiratory: Positive for chest tightness. Negative for  cough and shortness of breath.   Cardiovascular: Positive for chest pain. Negative for palpitations and leg swelling.  Gastrointestinal: Positive for nausea. Negative for abdominal pain and vomiting.  Musculoskeletal: Negative.   Skin: Negative.   Neurological: Negative for dizziness, weakness, light-headedness and headaches.  Psychiatric/Behavioral:       Irritated     Physical Exam Updated Vital Signs BP (!) 145/89   Pulse 64   Temp 98.3 F (36.8 C) (Oral)   Resp (!) 23   Ht 5' 10.5" (1.791 m)   Wt 94.3 kg (208 lb)   SpO2 97%   BMI 29.42 kg/m   Physical Exam  Constitutional: Vital signs are normal. He appears well-developed and well-nourished.  Eyes: Pupils are equal, round, and reactive to light. EOM are normal.  Neck: Normal range of motion and full passive range of motion without pain. Neck supple. Normal carotid pulses present. Carotid bruit is not present.  Cardiovascular: Normal rate, regular rhythm, normal heart sounds, intact distal pulses and normal pulses.  No extrasystoles are present. Exam reveals no gallop.  No murmur heard. Pulmonary/Chest: Effort normal and breath sounds normal.  Abdominal: Soft. Normal appearance and bowel sounds are normal. There is no tenderness.  Neurological: He has normal strength. No cranial nerve deficit or sensory deficit. He exhibits normal muscle tone.  Skin: Skin is warm and intact. Capillary refill takes 2 to 3 seconds. He is not diaphoretic. No pallor.  Nursing note and vitals reviewed.    ED Treatments / Results  Labs (all labs ordered are listed, but only abnormal results are displayed) Labs Reviewed  BASIC METABOLIC PANEL - Abnormal; Notable for the following components:      Result Value   Glucose, Bld 160 (*)    All other components within normal limits  CBC - Abnormal; Notable for the following components:   RBC 5.88 (*)    All other components within normal limits  I-STAT TROPONIN, ED    EKG EKG  Interpretation  Date/Time:  Thursday March 08 2018 17:02:20 EDT Ventricular Rate:  76 PR Interval:  184 QRS Duration: 110 QT Interval:  376 QTC Calculation: 423 R Axis:   25 Text Interpretation:  Normal sinus rhythm Possible Left atrial enlargement Possible Inferior infarct , age undetermined Anterior infarct , age undetermined Abnormal ECG Since last EKG, no significant changes Confirmed by Shaune Pollack [phone]) on 03/08/2018 6:31:52 PM   Radiology Dg Chest 2 View  Result Date: 03/08/2018 CLINICAL DATA:  Intermittent chest pain EXAM: CHEST - 2 VIEW COMPARISON:  03/07/2018 FINDINGS: No focal airspace disease or effusion. Coarse chronic appearing bronchitic changes. Stable cardiomediastinal silhouette. Aortic atherosclerosis. No pneumothorax. IMPRESSION: No active cardiopulmonary disease.  Bronchitic changes. Electronically Signed   By: Jasmine Pang M.D.   On: 03/08/2018 18:11   Dg Chest 2 View  Result Date:  03/07/2018 CLINICAL DATA:  Preop cardiac catheterization/stent placement. History of smoking. EXAM: CHEST - 2 VIEW COMPARISON:  None. FINDINGS: The cardiomediastinal silhouette is within normal limits. There is slight elevation of the right hemidiaphragm. Central airway thickening is noted, and there is interstitial coarsening most notable in the lower lungs. No confluent airspace opacity, overt pulmonary edema, pleural effusion, or pneumothorax is identified. No acute osseous abnormality is seen. IMPRESSION: Bronchitic changes. Electronically Signed   By: Sebastian Ache M.D.   On: 03/07/2018 16:03    Procedures .Critical Care Performed by: Windy Carina, PA-C Authorized by: Windy Carina, PA-C   Critical care provider statement:    Critical care time (minutes):  60   Critical care start time:  03/08/2018 5:30 PM   Critical care end time:  03/08/2018 6:42 PM   Critical care time was exclusive of:  Separately billable procedures and treating other patients   Critical care  was necessary to treat or prevent imminent or life-threatening deterioration of the following conditions:  Cardiac failure   Critical care was time spent personally by me on the following activities:  Development of treatment plan with patient or surrogate, discussions with consultants, examination of patient, review of old charts, ordering and review of laboratory studies and ordering and review of radiographic studies   I assumed direction of critical care for this patient from another provider in my specialty: no   Comments:     Patient presented with NSTEMI and needs urgent catherization. Had elevated troponin 1 at cardiologist office, but no ST elevations on EKG. Consulted with cardiologist regarding admission and cath.   (including critical care time)  Medications Ordered in ED Medications - No data to display   Initial Impression / Assessment and Plan / ED Course  Triage vital signs and the nursing notes have been reviewed.  Pertinent labs & imaging results that were available during care of the patient were reviewed and considered in medical decision making (see chart for details).  Patient presents at the behest of his cardiologist for an elevated troponin. Patient has had symptoms of stable angina for the last year, but this progressively worsened and now has chest pain with minimal exertion. This history combined with newly elevated troponin and no ST elevations on EKG is consistent with NSTEMI. Patient scheduled for cardiac cath next week, but given recent symptom trend and labs, this may need to occur sooner.  Clinical Course as of Mar 08 1849  Thu Mar 08, 2018  1718 Compared today's EKG with EKG taken at cardiologist office yesterday, no acute changes seen.   [GM]  1725 Troponin result from cardiologist is 0.08 on 03/07/18. However, today it is decreased at 0.01. Case discussed with Dr. Shaune Pollack. Patient likely will have to be admitted for cathertization given that elevated  troponin is indication of myocardial injury regardless of when it happened.   [GM]  1753 Case discussed with Dr. Santiago Glad from cardiology to admit patient for NSTEMI. She reports that Dr. Anne Fu will come down and see the patient.   [GM]  [id number] Cardiology came to see the patient and will admit for NSTEMI. IV heparin, beta blocker and high intensity statin to be administered. Cath scheduled for tomorrow 03/09/18.   [GM]    Clinical Course User Index [GM] Kerrigan Glendening, Sharyon Medicus, PA-C    Final Clinical Impressions(s) / ED Diagnoses  1. NSTEMI. Case discussed with cardiology who will admit the patient. IV heparin will be initiated.  Dispo: Admit. Cardiac  cath scheduled for tomorrow 03/09/18.  Final diagnoses:  NSTEMI (non-ST elevated myocardial infarction) [facility](HCC)    ED Discharge Orders    None        Reva BoresMortis, Oaklen Thiam I, PA-C 03/08/18 1850    Shaune PollackIsaacs, Cameron, MD 03/08/18 2245

## 2018-03-08 NOTE — ED Notes (Signed)
Patient transported to X-ray 

## 2018-03-08 NOTE — Telephone Encounter (Signed)
Called Dr. Hulen ShoutsMunley's office to inform of lab called this am, and information given as it appears Dr. Dulce SellarMunley is out of the office. Patient name and lab valve given to staff along with my number to call if follow up is needed on my end.   Laverda PageLindsay Madasyn Heath NP

## 2018-03-08 NOTE — ED Notes (Addendum)
Misty StanleyLisa, RN with [facility] called to say that Dr. Edilia Boickson will declot AVF tomorrow at 1430, pt is to arrive at noon.  He can have clear liquids from midnight to 0800, than NPO.  Pt is to take his coreg as usual.

## 2018-03-09 ENCOUNTER — Other Ambulatory Visit: Payer: Self-pay

## 2018-03-09 ENCOUNTER — Encounter (HOSPITAL_COMMUNITY)
Admission: EM | Disposition: A | Discharge status: Home / Self Care | Payer: Self-pay | Source: Home / Self Care | Attending: Cardiothoracic Surgery

## 2018-03-09 ENCOUNTER — Inpatient Hospital Stay (HOSPITAL_COMMUNITY): Payer: 59

## 2018-03-09 ENCOUNTER — Encounter (HOSPITAL_COMMUNITY): Payer: Self-pay | Admitting: Interventional Cardiology

## 2018-03-09 DIAGNOSIS — I25110 Atherosclerotic heart disease of native coronary artery with unstable angina pectoris: Secondary | ICD-10-CM | POA: Diagnosis not present

## 2018-03-09 DIAGNOSIS — Z79899 Other long term (current) drug therapy: Secondary | ICD-10-CM | POA: Diagnosis not present

## 2018-03-09 DIAGNOSIS — F17210 Nicotine dependence, cigarettes, uncomplicated: Secondary | ICD-10-CM | POA: Diagnosis present

## 2018-03-09 DIAGNOSIS — E1169 Type 2 diabetes mellitus with other specified complication: Secondary | ICD-10-CM | POA: Diagnosis not present

## 2018-03-09 DIAGNOSIS — R079 Chest pain, unspecified: Secondary | ICD-10-CM | POA: Diagnosis not present

## 2018-03-09 DIAGNOSIS — Z01810 Encounter for preprocedural cardiovascular examination: Secondary | ICD-10-CM

## 2018-03-09 DIAGNOSIS — Z951 Presence of aortocoronary bypass graft: Secondary | ICD-10-CM | POA: Diagnosis not present

## 2018-03-09 DIAGNOSIS — I517 Cardiomegaly: Secondary | ICD-10-CM | POA: Diagnosis not present

## 2018-03-09 DIAGNOSIS — Z7984 Long term (current) use of oral hypoglycemic drugs: Secondary | ICD-10-CM | POA: Diagnosis not present

## 2018-03-09 DIAGNOSIS — J9811 Atelectasis: Secondary | ICD-10-CM | POA: Diagnosis not present

## 2018-03-09 DIAGNOSIS — I4892 Unspecified atrial flutter: Secondary | ICD-10-CM | POA: Diagnosis not present

## 2018-03-09 DIAGNOSIS — D62 Acute posthemorrhagic anemia: Secondary | ICD-10-CM | POA: Diagnosis not present

## 2018-03-09 DIAGNOSIS — I25119 Atherosclerotic heart disease of native coronary artery with unspecified angina pectoris: Secondary | ICD-10-CM

## 2018-03-09 DIAGNOSIS — D696 Thrombocytopenia, unspecified: Secondary | ICD-10-CM | POA: Diagnosis not present

## 2018-03-09 DIAGNOSIS — I4891 Unspecified atrial fibrillation: Secondary | ICD-10-CM | POA: Diagnosis not present

## 2018-03-09 DIAGNOSIS — E785 Hyperlipidemia, unspecified: Secondary | ICD-10-CM | POA: Diagnosis present

## 2018-03-09 DIAGNOSIS — I214 Non-ST elevation (NSTEMI) myocardial infarction: Secondary | ICD-10-CM | POA: Diagnosis not present

## 2018-03-09 DIAGNOSIS — I340 Nonrheumatic mitral (valve) insufficiency: Secondary | ICD-10-CM | POA: Diagnosis not present

## 2018-03-09 DIAGNOSIS — E119 Type 2 diabetes mellitus without complications: Secondary | ICD-10-CM | POA: Diagnosis not present

## 2018-03-09 DIAGNOSIS — J449 Chronic obstructive pulmonary disease, unspecified: Secondary | ICD-10-CM | POA: Diagnosis present

## 2018-03-09 DIAGNOSIS — I10 Essential (primary) hypertension: Secondary | ICD-10-CM | POA: Diagnosis not present

## 2018-03-09 DIAGNOSIS — I2510 Atherosclerotic heart disease of native coronary artery without angina pectoris: Secondary | ICD-10-CM | POA: Diagnosis not present

## 2018-03-09 DIAGNOSIS — Z7982 Long term (current) use of aspirin: Secondary | ICD-10-CM | POA: Diagnosis not present

## 2018-03-09 DIAGNOSIS — E8770 Fluid overload, unspecified: Secondary | ICD-10-CM | POA: Diagnosis not present

## 2018-03-09 HISTORY — PX: LEFT HEART CATH AND CORONARY ANGIOGRAPHY: CATH118249

## 2018-03-09 LAB — CBC
HEMATOCRIT: 44.6 % (ref 39.0–52.0)
HEMOGLOBIN: 14.8 g/dL (ref 13.0–17.0)
MCH: 28 pg (ref 26.0–34.0)
MCHC: 33.2 g/dL (ref 30.0–36.0)
MCV: 84.3 fL (ref 78.0–100.0)
PLATELETS: 127 10*3/uL — AB (ref 150–400)
RBC: 5.29 MIL/uL (ref 4.22–5.81)
RDW: 13.1 % (ref 11.5–15.5)
WBC: 5.9 10*3/uL (ref 4.0–10.5)

## 2018-03-09 LAB — PULMONARY FUNCTION TEST
FEF 25-75 Post: 1.04 L/sec
FEF 25-75 Pre: 0.62 L/sec
FEF2575-%Change-Post: 67 %
FEF2575-%Pred-Post: 31 %
FEF2575-%Pred-Pre: 19 %
FEV1-%Change-Post: 16 %
FEV1-%Pred-Post: 29 %
FEV1-%Pred-Pre: 24 %
FEV1-Post: 1.15 L
FEV1-Pre: 0.98 L
FEV1FVC-%Change-Post: 4 %
FEV1FVC-%Pred-Pre: 88 %
FEV6-%Change-Post: 10 %
FEV6-%Pred-Post: 32 %
FEV6-%Pred-Pre: 29 %
FEV6-Post: 1.61 L
FEV6-Pre: 1.46 L
FEV6FVC-%Change-Post: 0 %
FEV6FVC-%Pred-Post: 104 %
FEV6FVC-%Pred-Pre: 103 %
FVC-%Change-Post: 11 %
FVC-%Pred-Post: 31 %
FVC-%Pred-Pre: 28 %
FVC-Post: 1.63 L
FVC-Pre: 1.46 L
Post FEV1/FVC ratio: 70 %
Post FEV6/FVC ratio: 100 %
Pre FEV1/FVC ratio: 67 %
Pre FEV6/FVC Ratio: 100 %

## 2018-03-09 LAB — BASIC METABOLIC PANEL
ANION GAP: 8 (ref 5–15)
BUN: 17 mg/dL (ref 6–20)
CALCIUM: 8.9 mg/dL (ref 8.9–10.3)
CO2: 25 mmol/L (ref 22–32)
Chloride: 104 mmol/L (ref 98–111)
Creatinine, Ser: 0.93 mg/dL (ref 0.61–1.24)
GFR calc Af Amer: >60 mL/min (ref >60)
GFR calc non Af Amer: >60 mL/min (ref >60)
GLUCOSE: 167 mg/dL — AB (ref 70–99)
Potassium: 4.2 mmol/L (ref 3.5–5.1)
Sodium: 137 mmol/L (ref 135–145)

## 2018-03-09 LAB — PROTIME-INR
INR: 1.18
PROTHROMBIN TIME: 14.9 s (ref 11.4–15.2)

## 2018-03-09 LAB — LIPID PANEL
CHOL/HDL RATIO: 5.7 ratio
CHOLESTEROL: 154 mg/dL (ref 0–200)
HDL: 27 mg/dL — ABNORMAL LOW (ref >40)
LDL Cholesterol: 63 mg/dL (ref 0–99)
Triglycerides: 318 mg/dL — ABNORMAL HIGH (ref <150)
VLDL: 64 mg/dL — ABNORMAL HIGH (ref 0–40)

## 2018-03-09 LAB — ECHOCARDIOGRAM COMPLETE
Height: 72 in
Weight: 3262.81 oz

## 2018-03-09 LAB — MRSA PCR SCREENING: MRSA BY PCR: NEGATIVE

## 2018-03-09 LAB — HEPARIN LEVEL (UNFRACTIONATED)
HEPARIN UNFRACTIONATED: 0.28 [IU]/mL — AB (ref 0.30–0.70)
Heparin Unfractionated: 0.54 IU/mL (ref 0.30–0.70)

## 2018-03-09 LAB — GLUCOSE, CAPILLARY: GLUCOSE-CAPILLARY: 167 mg/dL — AB (ref 70–99)

## 2018-03-09 LAB — HIV ANTIBODY (ROUTINE TESTING W REFLEX): HIV SCREEN 4TH GENERATION: NONREACTIVE

## 2018-03-09 LAB — TROPONIN I

## 2018-03-09 SURGERY — LEFT HEART CATH AND CORONARY ANGIOGRAPHY
Anesthesia: LOCAL

## 2018-03-09 MED ORDER — HEPARIN (PORCINE) IN NACL 2000-0.9 UNIT/L-% IV SOLN
INTRAVENOUS | Status: AC
  Filled 2018-03-09: qty 1000

## 2018-03-09 MED ORDER — ASPIRIN EC 81 MG PO TBEC
81.0000 mg | DELAYED_RELEASE_TABLET | Freq: Every day | ORAL | Status: DC
  Administered 2018-03-09 – 2018-03-11 (×3): 81 mg via ORAL
  Filled 2018-03-09 (×3): qty 1

## 2018-03-09 MED ORDER — ATORVASTATIN CALCIUM 80 MG PO TABS
80.0000 mg | ORAL_TABLET | Freq: Every day | ORAL | Status: DC
Start: 2018-03-09 — End: 2018-03-17
  Administered 2018-03-09 – 2018-03-17 (×8): 80 mg via ORAL
  Filled 2018-03-09 (×8): qty 1

## 2018-03-09 MED ORDER — SODIUM CHLORIDE 0.9 % IV SOLN: INTRAVENOUS | Status: AC

## 2018-03-09 MED ORDER — HEPARIN SODIUM (PORCINE) 1000 UNIT/ML IJ SOLN
INTRAMUSCULAR | Status: AC
  Filled 2018-03-09: qty 1

## 2018-03-09 MED ORDER — SODIUM CHLORIDE 0.9% FLUSH: 3.0000 mL | INTRAVENOUS | Status: DC | PRN

## 2018-03-09 MED ORDER — METOPROLOL TARTRATE 25 MG PO TABS
25.0000 mg | ORAL_TABLET | Freq: Two times a day (BID) | ORAL | Status: DC
  Administered 2018-03-09 – 2018-03-10 (×4): 25 mg via ORAL
  Filled 2018-03-09 (×5): qty 1

## 2018-03-09 MED ORDER — ASPIRIN 81 MG PO CHEW
81.0000 mg | CHEWABLE_TABLET | ORAL | Status: AC
  Administered 2018-03-09: 81 mg via ORAL
  Filled 2018-03-09: qty 1

## 2018-03-09 MED ORDER — SODIUM CHLORIDE 0.9 % WEIGHT BASED INFUSION: 3.0000 mL/kg/h | INTRAVENOUS | Status: DC

## 2018-03-09 MED ORDER — HEPARIN BOLUS VIA INFUSION
4000.0000 [IU] | Freq: Once | INTRAVENOUS | Status: AC
  Administered 2018-03-09: 4000 [IU] via INTRAVENOUS
  Filled 2018-03-09: qty 4000

## 2018-03-09 MED ORDER — HEPARIN (PORCINE) IN NACL 100-0.45 UNIT/ML-% IJ SOLN
1400.0000 [IU]/h | INTRAMUSCULAR | Status: DC
  Administered 2018-03-09: 1300 [IU]/h via INTRAVENOUS
  Administered 2018-03-10 – 2018-03-11 (×3): 1400 [IU]/h via INTRAVENOUS
  Filled 2018-03-09 (×3): qty 250

## 2018-03-09 MED ORDER — SODIUM CHLORIDE 0.9 % IV SOLN: 250.0000 mL | INTRAVENOUS | Status: DC | PRN

## 2018-03-09 MED ORDER — ALBUTEROL SULFATE (2.5 MG/3ML) 0.083% IN NEBU
2.5000 mg | INHALATION_SOLUTION | Freq: Once | RESPIRATORY_TRACT | Status: AC
  Administered 2018-03-09: 2.5 mg via RESPIRATORY_TRACT

## 2018-03-09 MED ORDER — ONDANSETRON HCL 4 MG/2ML IJ SOLN: 4.0000 mg | Freq: Four times a day (QID) | INTRAMUSCULAR | Status: DC | PRN

## 2018-03-09 MED ORDER — FENTANYL CITRATE (PF) 100 MCG/2ML IJ SOLN
INTRAMUSCULAR | Status: DC | PRN
  Administered 2018-03-09: 25 ug via INTRAVENOUS

## 2018-03-09 MED ORDER — SODIUM CHLORIDE 0.9% FLUSH
3.0000 mL | Freq: Two times a day (BID) | INTRAVENOUS | Status: DC
  Administered 2018-03-09: 3 mL via INTRAVENOUS

## 2018-03-09 MED ORDER — NITROGLYCERIN 0.4 MG SL SUBL: 0.4000 mg | SUBLINGUAL_TABLET | SUBLINGUAL | Status: DC | PRN

## 2018-03-09 MED ORDER — PERFLUTREN LIPID MICROSPHERE
INTRAVENOUS | Status: AC
  Administered 2018-03-09: 2 mL via INTRAVENOUS
  Filled 2018-03-09: qty 10

## 2018-03-09 MED ORDER — NITROGLYCERIN 1 MG/10 ML FOR IR/CATH LAB
INTRA_ARTERIAL | Status: AC
  Filled 2018-03-09: qty 10

## 2018-03-09 MED ORDER — MIDAZOLAM HCL 2 MG/2ML IJ SOLN
INTRAMUSCULAR | Status: DC | PRN
  Administered 2018-03-09: 0.5 mg via INTRAVENOUS

## 2018-03-09 MED ORDER — MIDAZOLAM HCL 2 MG/2ML IJ SOLN
INTRAMUSCULAR | Status: AC
  Filled 2018-03-09: qty 2

## 2018-03-09 MED ORDER — ACETAMINOPHEN 325 MG PO TABS: 650.0000 mg | ORAL_TABLET | ORAL | Status: DC | PRN

## 2018-03-09 MED ORDER — LIDOCAINE HCL (PF) 1 % IJ SOLN
INTRAMUSCULAR | Status: AC
  Filled 2018-03-09: qty 30

## 2018-03-09 MED ORDER — IOHEXOL 350 MG/ML SOLN
INTRAVENOUS | Status: DC | PRN
  Administered 2018-03-09: 70 mL via INTRAVENOUS

## 2018-03-09 MED ORDER — HEPARIN (PORCINE) IN NACL 100-0.45 UNIT/ML-% IJ SOLN
1300.0000 [IU]/h | INTRAMUSCULAR | Status: DC
  Administered 2018-03-09: 1300 [IU]/h via INTRAVENOUS
  Filled 2018-03-09: qty 250

## 2018-03-09 MED ORDER — SODIUM CHLORIDE 0.9 % WEIGHT BASED INFUSION
3.0000 mL/kg/h | INTRAVENOUS | Status: DC
  Administered 2018-03-09: 3 mL/kg/h via INTRAVENOUS

## 2018-03-09 MED ORDER — ASPIRIN 81 MG PO CHEW: 81.0000 mg | CHEWABLE_TABLET | Freq: Every day | ORAL | Status: DC

## 2018-03-09 MED ORDER — ASPIRIN 81 MG PO CHEW: 81.0000 mg | CHEWABLE_TABLET | ORAL | Status: DC

## 2018-03-09 MED ORDER — HEPARIN (PORCINE) IN NACL 1000-0.9 UT/500ML-% IV SOLN
INTRAVENOUS | Status: DC | PRN
  Administered 2018-03-09 (×2): 500 mL

## 2018-03-09 MED ORDER — HEPARIN SODIUM (PORCINE) 1000 UNIT/ML IJ SOLN
INTRAMUSCULAR | Status: DC | PRN
  Administered 2018-03-09: 4500 [IU] via INTRAVENOUS

## 2018-03-09 MED ORDER — SODIUM CHLORIDE 0.9% FLUSH
3.0000 mL | Freq: Two times a day (BID) | INTRAVENOUS | Status: DC
  Administered 2018-03-09 – 2018-03-11 (×4): 3 mL via INTRAVENOUS

## 2018-03-09 MED ORDER — LIDOCAINE HCL (PF) 1 % IJ SOLN
INTRAMUSCULAR | Status: DC | PRN
  Administered 2018-03-09: 2 mL

## 2018-03-09 MED ORDER — PERFLUTREN LIPID MICROSPHERE
1.0000 mL | INTRAVENOUS | Status: AC | PRN
  Administered 2018-03-09: 2 mL via INTRAVENOUS
  Filled 2018-03-09: qty 10

## 2018-03-09 MED ORDER — OXYCODONE HCL 5 MG PO TABS
5.0000 mg | ORAL_TABLET | ORAL | Status: DC | PRN
  Administered 2018-03-09: 5 mg via ORAL
  Filled 2018-03-09: qty 1

## 2018-03-09 MED ORDER — PANTOPRAZOLE SODIUM 40 MG PO TBEC
40.0000 mg | DELAYED_RELEASE_TABLET | Freq: Every day | ORAL | Status: DC
  Administered 2018-03-09 – 2018-03-11 (×3): 40 mg via ORAL
  Filled 2018-03-09 (×3): qty 1

## 2018-03-09 MED ORDER — FENTANYL CITRATE (PF) 100 MCG/2ML IJ SOLN
INTRAMUSCULAR | Status: AC
  Filled 2018-03-09: qty 2

## 2018-03-09 MED ORDER — TRAMADOL HCL 50 MG PO TABS: 50.0000 mg | ORAL_TABLET | Freq: Four times a day (QID) | ORAL | Status: DC | PRN

## 2018-03-09 MED ORDER — VITAMIN B-1 100 MG PO TABS
100.0000 mg | ORAL_TABLET | Freq: Every day | ORAL | Status: DC
  Administered 2018-03-09 – 2018-03-17 (×7): 100 mg via ORAL
  Filled 2018-03-09 (×7): qty 1

## 2018-03-09 MED ORDER — NITROGLYCERIN 1 MG/10 ML FOR IR/CATH LAB
INTRA_ARTERIAL | Status: DC | PRN
  Administered 2018-03-09: 200 ug via INTRACORONARY

## 2018-03-09 MED ORDER — VERAPAMIL HCL 2.5 MG/ML IV SOLN
INTRAVENOUS | Status: AC
  Filled 2018-03-09: qty 2

## 2018-03-09 MED ORDER — VERAPAMIL HCL 2.5 MG/ML IV SOLN
INTRAVENOUS | Status: DC | PRN
  Administered 2018-03-09: 10 mL via INTRA_ARTERIAL

## 2018-03-09 MED ORDER — SODIUM CHLORIDE 0.9 % WEIGHT BASED INFUSION: 1.0000 mL/kg/h | INTRAVENOUS | Status: DC

## 2018-03-09 SURGICAL SUPPLY — 11 items
CATH INFINITI 5 FR JL3.5 (CATHETERS) ×2 IMPLANT
CATH INFINITI JR4 5F (CATHETERS) ×2 IMPLANT
DEVICE RAD COMP TR BAND LRG (VASCULAR PRODUCTS) ×2 IMPLANT
GLIDESHEATH SLEND A-KIT 6F 22G (SHEATH) ×2 IMPLANT
GUIDEWIRE INQWIRE 1.5J.035X260 (WIRE) ×1 IMPLANT
INQWIRE 1.5J .035X260CM (WIRE) ×2
KIT HEART LEFT (KITS) ×2 IMPLANT
PACK CARDIAC CATHETERIZATION (CUSTOM PROCEDURE TRAY) ×2 IMPLANT
SHEATH PROBE COVER 6X72 (BAG) ×2 IMPLANT
TRANSDUCER W/STOPCOCK (MISCELLANEOUS) ×2 IMPLANT
TUBING CIL FLEX 10 FLL-RA (TUBING) ×2 IMPLANT

## 2018-03-09 NOTE — Progress Notes (Signed)
Removed TR-band from right wrist site without edema or bleeding gauze pad applied.  Remind patient and significant other not to move wrist or pull with that right hand to prevent bleeding issues.

## 2018-03-09 NOTE — Progress Notes (Signed)
Pre-op Cardiac Surgery  Carotid Findings:  Internal Carotid artery 1-39% bilaterally.   Upper Extremity Right Left  Brachial Pressures Unable to obtain 126  Radial Waveforms Triphasic Triphasic  Ulnar Waveforms Triphasic Triphasic  Palmar Arch (Allen's Test) Unable to obtain Radial: decrease 50% with compression Ulnar: decrease 50% with compression      Lower  Extremity Right Left  Dorsalis Pedis 140 132      Posterior Tibial 141 138  Ankle/Brachial Indices 1.12 1.10    Findings:  Rt and Lt ankle brachial index is within normal range at rest.   Ronald Stephens 03/09/2018 4:39 PM

## 2018-03-09 NOTE — Progress Notes (Signed)
ANTICOAGULATION CONSULT NOTE - Initial Consult  Pharmacy Consult for heparin Indication: NSTEMI  No Known Allergies  Patient Measurements: Height: 5' 10.5" (179.1 cm) Weight: 208 lb (94.3 kg) IBW/kg (Calculated) : 74.15  Vital Signs: Temp: 98.3 F (36.8 C) (07/25 1656) Temp Source: Oral (07/25 1656) BP: 113/65 (07/26 0115) Pulse Rate: 70 (07/26 0115)  Labs: Recent Labs    03/07/18 1430 03/08/18 1657  HGB  --  16.1  HCT  --  49.4  PLT  --  166  CREATININE  --  0.96  TROPONINI 0.08*  --     Estimated Creatinine Clearance: 98.7 mL/min (by C-G formula based on SCr of 0.96 mg/dL).   Medical History: Past Medical History:  Diagnosis Date  . Arthritis   . Bilateral ankle fractures 2014  . Diabetes mellitus   . Heart murmur    as child  . Hyperlipidemia   . Hypertension   . Right forearm fracture 2014  . Tobacco abuse     Assessment: 57yo male c/o CP w/ radiation to BUE intermittently x747yr but worse over last mo, awaiting cardiac cath, to begin heparin.  Goal of Therapy:  Heparin level 0.3-0.7 units/ml Monitor platelets by anticoagulation protocol: Yes   Plan:  Will give heparin 4000 units IV bolus x1 followed by gtt at 1300 units/hr and monitor heparin levels and CBC.  Vernard GamblesVeronda Maty Zeisler, PharmD, BCPS  03/09/2018,2:04 AM

## 2018-03-09 NOTE — Consult Note (Addendum)
[address]       [place] [address]             [id number]        [facility] Medical Record #[id number] Date of Birth: 12-07-1960  Referring: Dr. Verdis Prime, MD Primary Care: Hannah Beat, MD Primary Cardiologist:Brian Dulce Sellar, MD  Chief Complaint:    Chief Complaint  Patient presents with  . Chest Pain  . Abnormal Lab (Troponin)  Reason for Consultation: S/p NSTEMI, coronary artery disease  History of Present Illness:     This is a 57 year old male with a past medical history of hypertension, hyperlipidemia, diabetes mellitus, tobacco abuse who states that he has had upper chest pain radiating to both arms that has occurred with both exertion and at rest. He states this has been going on "for awhile" but has worsened over the last month. Also, there are days when he has multiple episodes but days when he has no episodes. He denies shortness of breath, LE edema, nausea, diaphoresis, pre syncope, or syncope.  He states he presented to his general practitioner, Dr. Patsy Lager on 02/28/2018 for further evaluation and treatment of aforementioned. EKG and labs were obtained as well as referral to cardiology. Critical lab result of Troponin I 0.08 was obtained. [facility]. EKG showed normal sinus rhythm, possible Left atrial enlargement, possible inferior infarct (age undetermined), anterior infarct (age undetermined) and abnormal ECG since last EKG. Patient ruled in for a NSTEMI. He underwent a cardiac catheterization earlier today. Results showed severe three vessel coronary artery disease (please see results below). Dr. Tyrone Sage has been consulted for consideration of coronary artery bypass grafting surgery. At the time of being seen, he denied chest pain and shortness of breath, his vital signs are stable, and he is on a Heparin drip.   Current Activity/ Functional Status: Patient is independent with  mobility/ambulation, transfers, ADL's, IADL's.   Zubrod Score: At the time of surgery this patient's most appropriate activity status/level should be described as: []     0    Normal activity, no symptoms [x]     1    Restricted in physical strenuous activity but ambulatory, able to do out light work []     2    Ambulatory and capable of self care, unable to do work activities, up and about                 more than 50%  Of the time                            []     3    Only limited self care, in bed greater than 50% of waking hours []     4    Completely disabled, no self care, confined to bed or chair []     5    Moribund  Past Medical History:  Diagnosis Date  . Arthritis   . Bilateral ankle fractures 2014  . Diabetes mellitus   . Heart murmur    as child  . Hyperlipidemia   . Hypertension   . Right forearm fracture 2014  . Tobacco abuse     Past Surgical History:  Procedure Laterality Date  . CARDIAC CATHETERIZATION     as teen    Social History   Tobacco Use  Smoking Status Current Every Day Smoker  . Packs/day:  0.50  . Years: 30.00  . Pack years: 15.00  . Types: Cigarettes  Smokeless Tobacco Current User  . Last attempt to quit: 03/05/2014    Social History   Substance and Sexual Activity  Alcohol Use Yes  . Alcohol/week: 0.0 oz   Comment: 3-4 beers per day    Allergies: No Known Allergies  Current Facility-Administered Medications  Medication Dose Route Frequency Provider Last Rate Last Dose  . 0.9 %  sodium chloride infusion   Intravenous Continuous Lyn RecordsSmith, Henry W, MD 125 mL/hr at 03/09/18 [phone]    . 0.9 %  sodium chloride infusion  250 mL Intravenous PRN Lyn RecordsSmith, Henry W, MD      . acetaminophen (TYLENOL) tablet 650 mg  650 mg Oral Q4H PRN Lyn RecordsSmith, Henry W, MD      . aspirin EC tablet 81 mg  81 mg Oral Daily Robbie LisSimmons, Brittainy M, PA-C   81 mg at 03/09/18 1006  . atorvastatin (LIPITOR) tablet 80 mg  80 mg Oral Daily Robbie LisSimmons, Brittainy M, PA-C   80 mg at 03/09/18  1006  . metoprolol tartrate (LOPRESSOR) tablet 25 mg  25 mg Oral BID Robbie LisSimmons, Brittainy M, PA-C   25 mg at 03/09/18 1005  . nitroGLYCERIN (NITROSTAT) SL tablet 0.4 mg  0.4 mg Sublingual Q5 min PRN Robbie LisSimmons, Brittainy M, PA-C      . ondansetron [facility](ZOFRAN) injection 4 mg  4 mg Intravenous Q6H PRN Lyn RecordsSmith, Henry W, MD      . oxyCODONE (Oxy IR/ROXICODONE) immediate release tablet 5-10 mg  5-10 mg Oral Q4H PRN Lyn RecordsSmith, Henry W, MD      . pantoprazole (PROTONIX) EC tablet 40 mg  40 mg Oral Daily Robbie LisSimmons, Brittainy M, PA-C   40 mg at 03/09/18 1006  . perflutren lipid microspheres (DEFINITY) IV suspension  1-10 mL Intravenous PRN Robbie LisSimmons, Brittainy M, PA-C   2 mL at 03/09/18 1047  . sodium chloride flush (NS) 0.9 % injection 3 mL  3 mL Intravenous Q12H Lyn RecordsSmith, Henry W, MD      . sodium chloride flush (NS) 0.9 % injection 3 mL  3 mL Intravenous PRN Lyn RecordsSmith, Henry W, MD        Medications Prior to Admission  Medication Sig Dispense Refill Last Dose  . aspirin EC 81 MG tablet Take 1 tablet (81 mg total) by mouth daily. 90 tablet 3 03/08/2018 at Unknown time  . atorvastatin (LIPITOR) 40 MG tablet Take 1 tablet (40 mg total) by mouth daily. 90 tablet 1 03/08/2018 at Unknown time  . ibuprofen (ADVIL) 200 MG tablet Take 800 mg by mouth daily. Shoulder pain   03/08/2018 at Unknown time  . lisinopril (PRINIVIL,ZESTRIL) 20 MG tablet Take 1 tablet (20 mg total) by mouth daily. 90 tablet 1 03/08/2018 at Unknown time  . metFORMIN (GLUCOPHAGE-XR) 500 MG 24 hr tablet Take 2 tablets (1,000 mg total) by mouth daily with breakfast. 180 tablet 1 03/08/2018 at Unknown time  . metoprolol tartrate (LOPRESSOR) 25 MG tablet Take 1 tablet (25 mg total) by mouth 2 (two) times daily. 60 tablet 3 03/08/2018 at 0345  . Multiple Vitamins-Minerals (CENTRUM SILVER 50+MEN PO) Take 1 tablet by mouth daily.   03/08/2018 at Unknown time  . nitroGLYCERIN (NITROSTAT) 0.4 MG SL tablet Place 1 tablet (0.4 mg total) under the tongue every 5 (five) minutes as  needed for chest pain. 25 tablet 11 unk  . omeprazole (PRILOSEC) 20 MG capsule TAKE ONE CAPSULE BY MOUTH TWICE A DAY 60 capsule 5  03/08/2018 at am    Family History  Adopted: Yes and unknown family history  He is widowed;he does have a girlfriend as well as a son who lives in [place]  Review of Systems:   ROS     Cardiac Review of Systems: Y or  [ N   ]= no  Chest Pain [  Y  ]  Resting SOB [ N  ] Exertional SOB  [  N]  Orthopnea Klaus.Mock  ]   Pedal Edema [ N  ]    Palpitations [  N] Syncope  [ N ]   Presyncope [  N ]  General Review of Systems: [Y] = yes [ N ]=no Constitional:  fatigue [Y  ]; nausea [ N ]; night sweats Klaus.Mock  ]; fever [ N ]; or chills Klaus.Mock  ]                                                               Eye : blurred vision Klaus.Mock  ]; diplopia [ N  ] Resp:   wheezing[ N ];  hemoptysis[ N ];  GI:  vomiting[ N ];  dysphagia[ N ]; melena[ N ];  hematochezia Klaus.Mock  ];  GU:  hematuria[ N ];                Skin: rash, swelling[ N ];, hair loss[ N ];    Heme/Lymph: bruising[N ];  bleeding[N ];  anemia[ N ];  Neuro: TIA[ N ];    stroke[N  ];  vertigo[ N ];  seizures[ N ];  difficulty walking[ N ];  Endocrine: diabetes[Y  ];  thyroid dysfunction[ N ];              Physical Exam: BP 122/68   Pulse 67   Temp 97.7 F (36.5 C) (Oral)   Resp 19   Ht 6' (1.829 m)   Wt 203 lb 14.8 oz (92.5 kg)   SpO2 95%   BMI 27.66 kg/m    General appearance: alert, cooperative and no distress Head: Normocephalic, without obvious abnormality, atraumatic Neck: no carotid bruit, no JVD and supple, symmetrical, trachea midline Resp: clear to auscultation bilaterally Cardio: RRR, no murmur GI: Soft, non tender, bowel sounds present Extremities: No LE edema;palpable pulses bilaterally;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  Moderate three-vessel coronary calcification.  Greater than 95% proximal LAD and 70% mid to distal.  First diagonal with multifocal 90% stenosis.  The LAD wraps around the left ventricular apex.  Ramus intermedius with proximal and mid 90% stenoses.  Circumflex is totally occluded after a large first obtuse marginal.  The obtuse marginal contains 70% small to mid stenosis.  Left to left collaterals to the occluded segment.  Dominant right coronary totally occluded proximal to mid segment with right to right and left to right collaterals.  Mild inferior hypokinesis.  EF 50 to 55%.  Normal LVEDP  RECOMMENDATIONS:   TCTS consultation to consider multivessel revascularization.  Resume IV heparin  Aggressive secondary risk factor modification:  High intensity statin therapy, glycemic control, start low-dose beta-blocker therapy, aspirin, and should be hospitalized until revascularization can occur.   Resting               Left Heart   Left Ventricle The left ventricular size is normal. The left ventricular systolic function is normal. LV end diastolic pressure is normal. The left ventricular ejection fraction is 50-55% by visual estimate.  Coronary Diagrams   Diagnostic Diagram           Recent Radiology Findings:   Dg Chest 2 View  Result Date: 03/08/2018 CLINICAL DATA:  Intermittent chest pain EXAM: CHEST - 2 VIEW COMPARISON:  03/07/2018 FINDINGS: No focal airspace disease or effusion. Coarse chronic appearing bronchitic changes. Stable cardiomediastinal silhouette. Aortic atherosclerosis. No pneumothorax. IMPRESSION: No active cardiopulmonary disease.  Bronchitic changes. Electronically Signed   By: Jasmine Pang M.D.   On: 03/08/2018 18:11   Dg Chest 2 View  Result Date: 03/07/2018 CLINICAL DATA:  Preop cardiac catheterization/stent placement. History of smoking. EXAM: CHEST - 2 VIEW COMPARISON:  None.  FINDINGS: The cardiomediastinal silhouette is within normal limits. There is slight elevation of the right hemidiaphragm. Central airway thickening is noted, and there is interstitial coarsening most notable in the lower lungs. No confluent airspace opacity, overt pulmonary edema, pleural effusion, or pneumothorax is identified. No acute osseous abnormality is seen. IMPRESSION: Bronchitic changes. Electronically Signed   By: Sebastian Ache M.D.   On: 03/07/2018 16:03     I have independently reviewed the above radiologic studies and discussed with the patient   Recent Lab Findings: Lab Results  Component Value Date   WBC 5.9 03/09/2018   HGB 14.8 03/09/2018   HCT 44.6 03/09/2018   PLT 127 (L) 03/09/2018   GLUCOSE 167 (H) 03/09/2018   CHOL 154 03/09/2018   TRIG 318 (H) 03/09/2018   HDL 27 (L) 03/09/2018   LDLDIRECT 174.5 06/05/2013   LDLCALC 63 03/09/2018   ALT 26 02/28/2018   AST 20 02/28/2018   NA 137 03/09/2018   K 4.2 03/09/2018   CL 104 03/09/2018   CREATININE 0.93 03/09/2018   BUN 17 03/09/2018   CO2 25 03/09/2018   TSH 2.50 10/08/2010   INR 1.18 03/09/2018   HGBA1C 8.4 (H) 02/28/2018    Assessment / Plan:   1.  S/p NSTEMI, coronary artery disease-would benefit fromCABG . He is an Personnel officer (mostly supervises) but states he might not want [place] to use left radial artery as possible graft harvest. Of note, patient is thinking about doing surgery, but expresses that he wants to go home for the weekend. I highly advised him against this. He wishes to speak with Dr. Tyrone Sage before making a decision. If he is agreeable, CABG on Monday. 2. History of hypertension-on Lopressor 25 mg bid 3. History of diabetes mellitus-on Metformin prior to admission. HGA1C 02/28/2018 8.4. Needs stricter glucose control and close medical follow up after discharge. 4. History of tobacco abuse 5. History of hyperlipidemia-on Atorvastatin 80 mg daily 6. History of alcohol use (3-4 beers most days)-start  Thiamine    Doree Fudge PA-C 03/09/2018 10:49 AM  Patient has been seen history taken, examined, reviewed echocardiogram cardiac catheterization chest x-ray and labs.  With his presentation with non-STEMI myocardial infarction and with significant three-vessel coronary artery disease including proximal LAD disease I recommended to the patient that he remain in the hospital and proceed with coronary artery bypass grafting Monday morning at 730 7/29 .  He is  currently without pain on heparin drip.  I cautioned him about leaving prior to surgery where he could not be safely monitored.  Risks of coronary artery bypass grafting and options were discussed with him.  In the face of diabetes three-vessel coronary artery disease including total circumflex cardiac stenting is not a good option.  The risk of coronary artery bypass grafting including death infection stroke myocardial infarction bleeding blood transfusion have all been discussed with the patient in detail.  He is willing to proceed.  Delight Ovens MD      [address] [place] [place] [address] Office [phone]   Beeper [phone]

## 2018-03-09 NOTE — Interval H&P Note (Signed)
Cath Lab Visit (complete for each Cath Lab visit)  Clinical Evaluation Leading to the Procedure:   ACS: Yes.    Non-ACS:    Anginal Classification: CCS Stephens  Anti-ischemic medical therapy: Minimal Therapy (1 class of medications)  Non-Invasive Test Results: No non-invasive testing performed  Prior CABG: No previous CABG      History and Physical Interval Note:  03/09/2018 7:39 AM  Ronald Stephens  has presented today for surgery, with the diagnosis of NSTEMI  The various methods of treatment have been discussed with the patient and family. After consideration of risks, benefits and other options for treatment, the patient has consented to  Procedure(s): LEFT HEART CATH AND CORONARY ANGIOGRAPHY (N/A) as a surgical intervention .  The patient's history has been reviewed, patient examined, no change in status, stable for surgery.  I have reviewed the patient's chart and labs.  Questions were answered to the patient's satisfaction.     Ronald Stephens

## 2018-03-09 NOTE — Progress Notes (Addendum)
ANTICOAGULATION CONSULT NOTE  Pharmacy Consult for heparin Indication: Multivessel disease/CABG  No Known Allergies  Patient Measurements: Height: 6' (182.9 cm) Weight: 203 lb 14.8 oz (92.5 kg) IBW/kg (Calculated) : 77.6  Vital Signs: Temp: 97.7 F (36.5 C) (07/26 0835) Temp Source: Oral (07/26 0835) BP: 122/68 (07/26 1000) Pulse Rate: 67 (07/26 1005)  Labs: Recent Labs    03/07/18 1430 03/08/18 1657 03/09/18 0244 03/09/18 0913  HGB  --  16.1  --  14.8  HCT  --  49.4  --  44.6  PLT  --  166  --  127*  LABPROT  --   --   --  14.9  INR  --   --   --  1.18  HEPARINUNFRC  --   --   --  0.54  CREATININE  --  0.96  --  0.93  TROPONINI 0.08*  --  <0.03 <0.03    Estimated Creatinine Clearance: 96.2 mL/min (by C-G formula based on SCr of 0.93 mg/dL).   Medical History: Past Medical History:  Diagnosis Date  . Arthritis   . Bilateral ankle fractures 2014  . Diabetes mellitus   . Heart murmur    as child  . Hyperlipidemia   . Hypertension   . Right forearm fracture 2014  . Tobacco abuse     Assessment: 57yo male c/o CP w/ radiation to BUE intermittently x6441yr but worse over last mo. Post cath, with shealth removal at approx 8:15am.  Heparin level 0.54, drawn after cath this am so unreliable. Hgb WNL, platelets slighly low at 126. No bleeding noted.   Goal of Therapy:  Heparin level 0.3-0.7 units/ml Monitor platelets by anticoagulation protocol: Yes   Plan:  Resume heparin gtt 1300 units/hr at 1615 Heparin level 6 hrs after restart Monitor heparin levels, CBC, s/s bleeding daily.  Ewing Schleinolton Ramatoulaye Pack, PharmD PGY1 Pharmacy Resident 03/09/2018    10:38 AM

## 2018-03-09 NOTE — Progress Notes (Signed)
  Echocardiogram 2D Echocardiogram has been performed.  Ronald Stephens 03/09/2018, 11:01 AM

## 2018-03-09 NOTE — Progress Notes (Signed)
ANTICOAGULATION CONSULT NOTE  Pharmacy Consult for heparin Indication: Multivessel disease/CABG  No Known Allergies  Patient Measurements: Height: 6' (182.9 cm) Weight: 203 lb 14.8 oz (92.5 kg) IBW/kg (Calculated) : 77.6  Vital Signs: Temp: 98.1 F (36.7 C) (07/26 2326) Temp Source: Oral (07/26 2326) BP: 144/73 (07/26 2326) Pulse Rate: 66 (07/26 2326)  Labs: Recent Labs    03/08/18 1657 03/09/18 0244 03/09/18 0913 03/09/18 1346 03/09/18 2237  HGB 16.1  --  14.8  --   --   HCT 49.4  --  44.6  --   --   PLT 166  --  127*  --   --   LABPROT  --   --  14.9  --   --   INR  --   --  1.18  --   --   HEPARINUNFRC  --   --  0.54  --  0.28*  CREATININE 0.96  --  0.93  --   --   TROPONINI  --  <0.03 <0.03 <0.03  --     Estimated Creatinine Clearance: 96.2 mL/min (by C-G formula based on SCr of 0.93 mg/dL).   Medical History: Past Medical History:  Diagnosis Date  . Arthritis   . Bilateral ankle fractures 2014  . Diabetes mellitus   . Heart murmur    as child  . Hyperlipidemia   . Hypertension   . Right forearm fracture 2014  . Tobacco abuse     Assessment: 57yo male c/o CP w/ radiation to BUE intermittently x57yr but worse over last mo. Post cath, with shealth removal at approx 8:15am.  Heparin level 0.28 units/ml  Goal of Therapy:  Heparin level 0.3-0.7 units/ml Monitor platelets by anticoagulation protocol: Yes   Plan:  Increase heparin gtt 1400 units/hr Monitor heparin levels, CBC, s/s bleeding daily.  Thanks for allowing pharmacy to be a part of this patient's care.  Talbert CageLora Arthella Headings, PharmD Clinical Pharmacist 03/09/2018    11:58 PM

## 2018-03-09 NOTE — Progress Notes (Signed)
   NSTEMI ASA, Statin, Bb Severe CAD CABG consult Discussed with Dr. Cyndy FreezeSmith  Mark Skains, MD

## 2018-03-10 DIAGNOSIS — E1169 Type 2 diabetes mellitus with other specified complication: Secondary | ICD-10-CM

## 2018-03-10 DIAGNOSIS — I2510 Atherosclerotic heart disease of native coronary artery without angina pectoris: Secondary | ICD-10-CM

## 2018-03-10 DIAGNOSIS — E785 Hyperlipidemia, unspecified: Secondary | ICD-10-CM

## 2018-03-10 LAB — CBC
HCT: 44.9 % (ref 39.0–52.0)
Hemoglobin: 14.8 g/dL (ref 13.0–17.0)
MCH: 27.7 pg (ref 26.0–34.0)
MCHC: 33 g/dL (ref 30.0–36.0)
MCV: 84.1 fL (ref 78.0–100.0)
PLATELETS: 119 10*3/uL — AB (ref 150–400)
RBC: 5.34 MIL/uL (ref 4.22–5.81)
RDW: 13.1 % (ref 11.5–15.5)
WBC: 6.2 10*3/uL (ref 4.0–10.5)

## 2018-03-10 LAB — HEPARIN LEVEL (UNFRACTIONATED): HEPARIN UNFRACTIONATED: 0.56 [IU]/mL (ref 0.30–0.70)

## 2018-03-10 MED ORDER — MILRINONE LACTATE IN DEXTROSE 20-5 MG/100ML-% IV SOLN
0.1250 ug/kg/min | INTRAVENOUS | Status: DC
  Filled 2018-03-10: qty 100

## 2018-03-10 MED ORDER — SODIUM CHLORIDE 0.9 % IV SOLN
INTRAVENOUS | Status: AC
  Administered 2018-03-12: 1 [IU]/h via INTRAVENOUS
  Filled 2018-03-10: qty 1

## 2018-03-10 MED ORDER — DOPAMINE-DEXTROSE 3.2-5 MG/ML-% IV SOLN
0.0000 ug/kg/min | INTRAVENOUS | Status: DC
  Filled 2018-03-10: qty 250

## 2018-03-10 MED ORDER — NITROGLYCERIN IN D5W 200-5 MCG/ML-% IV SOLN
2.0000 ug/min | INTRAVENOUS | Status: AC
  Administered 2018-03-12: 5 ug/min via INTRAVENOUS
  Filled 2018-03-10: qty 250

## 2018-03-10 MED ORDER — DEXMEDETOMIDINE HCL IN NACL 400 MCG/100ML IV SOLN
0.1000 ug/kg/h | INTRAVENOUS | Status: AC
  Administered 2018-03-12: 0.7 ug/kg/h via INTRAVENOUS
  Filled 2018-03-10: qty 100

## 2018-03-10 MED ORDER — MAGNESIUM SULFATE 50 % IJ SOLN
40.0000 meq | INTRAMUSCULAR | Status: DC
  Filled 2018-03-10: qty 9.85

## 2018-03-10 MED ORDER — CEFUROXIME SODIUM 1.5 G IV SOLR
1.5000 g | INTRAVENOUS | Status: AC
  Administered 2018-03-12: 1.5 g via INTRAVENOUS
  Filled 2018-03-10: qty 1.5

## 2018-03-10 MED ORDER — TRANEXAMIC ACID 1000 MG/10ML IV SOLN
1.5000 mg/kg/h | INTRAVENOUS | Status: AC
  Administered 2018-03-12: 1.5 mg/kg/h via INTRAVENOUS
  Filled 2018-03-10: qty 25

## 2018-03-10 MED ORDER — TRANEXAMIC ACID (OHS) PUMP PRIME SOLUTION
2.0000 mg/kg | INTRAVENOUS | Status: DC
  Filled 2018-03-10: qty 1.85

## 2018-03-10 MED ORDER — POTASSIUM CHLORIDE 2 MEQ/ML IV SOLN
80.0000 meq | INTRAVENOUS | Status: DC
  Filled 2018-03-10: qty 40

## 2018-03-10 MED ORDER — SODIUM CHLORIDE 0.9 % IV SOLN
1500.0000 mg | INTRAVENOUS | Status: AC
  Administered 2018-03-12: 1500 mg via INTRAVENOUS
  Filled 2018-03-10: qty 1500

## 2018-03-10 MED ORDER — SODIUM CHLORIDE 0.9 % IV SOLN
30.0000 ug/min | INTRAVENOUS | Status: AC
  Administered 2018-03-12: 10 ug/min via INTRAVENOUS
  Filled 2018-03-10: qty 2

## 2018-03-10 MED ORDER — PAPAVERINE HCL 30 MG/ML IJ SOLN
INTRAMUSCULAR | Status: AC
  Administered 2018-03-12: 10 mL
  Filled 2018-03-10: qty 2.5

## 2018-03-10 MED ORDER — SODIUM CHLORIDE 0.9 % IV SOLN
750.0000 mg | INTRAVENOUS | Status: DC
  Filled 2018-03-10: qty 750

## 2018-03-10 MED ORDER — SODIUM CHLORIDE 0.9 % IV SOLN
INTRAVENOUS | Status: DC
  Filled 2018-03-10: qty 30

## 2018-03-10 MED ORDER — EPINEPHRINE PF 1 MG/ML IJ SOLN
0.0000 ug/min | INTRAVENOUS | Status: DC
  Filled 2018-03-10: qty 4

## 2018-03-10 MED ORDER — TRANEXAMIC ACID (OHS) BOLUS VIA INFUSION
15.0000 mg/kg | INTRAVENOUS | Status: AC
  Administered 2018-03-12: 1387.5 mg via INTRAVENOUS

## 2018-03-10 NOTE — Progress Notes (Signed)
CARDIAC REHAB PHASE I   PRE:  Rate/Rhythm: 71  BP:  Sitting: 142/74     SaO2: 97ra  MODE:  Ambulation: 500 ft   POST:  Rate/Rhythm: 71  BP:  Sitting: 148/75     SaO2: 93ra  11:00a-11:40a Patient ambulated holding onto IV pole. No complaints. Pre-op teaching completed. Wants to watch video at a later point today. Book given.   Ronald ListerMolly M Filmore Molyneux, MS 03/10/2018 11:39 AM

## 2018-03-10 NOTE — Progress Notes (Signed)
ANTICOAGULATION CONSULT NOTE  Pharmacy Consult for heparin Indication: Multivessel disease/CABG planned   No Known Allergies  Patient Measurements: Height: 6' (182.9 cm) Weight: 203 lb 14.8 oz (92.5 kg) IBW/kg (Calculated) : 77.6  Vital Signs: Temp: 97.6 F (36.4 C) (07/27 0303) Temp Source: Oral (07/27 0303) BP: 142/72 (07/27 1035) Pulse Rate: 61 (07/27 1035)  Labs: Recent Labs    03/08/18 1657 03/09/18 0244 03/09/18 0913 03/09/18 1346 03/09/18 2237 03/10/18 0628  HGB 16.1  --  14.8  --   --  14.8  HCT 49.4  --  44.6  --   --  44.9  PLT 166  --  127*  --   --  119*  LABPROT  --   --  14.9  --   --   --   INR  --   --  1.18  --   --   --   HEPARINUNFRC  --   --  0.54  --  0.28* 0.56  CREATININE 0.96  --  0.93  --   --   --   TROPONINI  --  <0.03 <0.03 <0.03  --   --     Estimated Creatinine Clearance: 96.2 mL/min (by C-G formula based on SCr of 0.93 mg/dL).   Medical History: Past Medical History:  Diagnosis Date  . Arthritis   . Bilateral ankle fractures 2014  . Diabetes mellitus   . Heart murmur    as child  . Hyperlipidemia   . Hypertension   . Right forearm fracture 2014  . Tobacco abuse     Assessment: 57yo male c/o CP w/ radiation to B/LUE intermittently [id number] but worse over last mo. Post cath with 3v CAD plan CABG next week.  Restart heparin after sheath removal.  Heparin level 0.5 units/ml on heparin drip 1400 uts/hr CBC ok no bleeding.  Goal of Therapy:  Heparin level 0.3-0.7 units/ml Monitor platelets by anticoagulation protocol: Yes   Plan:  Continue  heparin gtt 1400 units/hr Monitor heparin levels, CBC, s/s bleeding daily.  Leota SauersLisa Brice Potteiger Pharm.D. CPP, BCPS Clinical Pharmacist [phone] 03/10/2018 10:45 AM

## 2018-03-10 NOTE — Progress Notes (Addendum)
DAILY PROGRESS NOTE   Patient Name: Ronald Stephens Date of Encounter: 03/10/2018  Chief Complaint   No chest pain overnight  Patient Profile   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 Seen by Dr. Servando Snare for possible CABG - he is planning on operating Monday. Patient is without complaints - did not seem to want to converse today.  Objective   Vitals:   03/09/18 1922 03/09/18 2326 03/10/18 0303 03/10/18 0807  BP: 129/73 (!) 144/73 (!) 141/71 (!) 141/78  Pulse: 61 66 (!) 56 (!) 54  Resp: 19 (!) 21 (!) 23 16  Temp: 97.7 F (36.5 C) 98.1 F (36.7 C) 97.6 F (36.4 C)   TempSrc: Oral Oral Oral   SpO2: 96% 97% 95% 97%  Weight:      Height:        Intake/Output Summary (Last 24 hours) at 03/10/2018 0835 Last data filed at 03/09/2018 2048 Gross per 24 hour  Intake 47.21 ml  Output 1300 ml  Net -1252.79 ml   Filed Weights   03/08/18 1657 03/09/18 0334  Weight: 208 lb (94.3 kg) 203 lb 14.8 oz (92.5 kg)    Physical Exam   General appearance: alert and no distress Lungs: clear to auscultation bilaterally Heart: regular rate and rhythm, S1, S2 normal, no murmur, click, rub or gallop Extremities: extremities normal, atraumatic, no cyanosis or edema Neurologic: Grossly normal  Inpatient Medications    Scheduled Meds: . aspirin EC  81 mg Oral Daily  . atorvastatin  80 mg Oral Daily  . metoprolol tartrate  25 mg Oral BID  . pantoprazole  40 mg Oral Daily  . sodium chloride flush  3 mL Intravenous Q12H  . thiamine  100 mg Oral Daily    Continuous Infusions: . sodium chloride     . heparin 1,400 Units/hr (03/10/18 0303)    PRN Meds: sodium chloride, acetaminophen, nitroGLYCERIN, ondansetron (ZOFRAN) IV, oxyCODONE, sodium chloride flush, traMADol   Labs   Results for orders placed or performed during the hospital encounter of 03/08/18 (from the past 48 hour(s))  Basic metabolic panel     Status: Abnormal   Collection Time: 03/08/18  4:57 PM  Result Value Ref Range   Sodium 137 135 - 145 mmol/L   Potassium 3.9 3.5 - 5.1 mmol/L   Chloride 101 98 - 111 mmol/L   CO2 27 22 - 32 mmol/L   Glucose, Bld 160 (H) 70 - 99 mg/dL   BUN 8 6 - 20 mg/dL   Creatinine, Ser 0.96 0.61 - 1.24 mg/dL   Calcium 9.5 8.9 - 10.3 mg/dL   GFR calc non Af Amer >60 >60 mL/min   GFR calc Af Amer >60 >60 mL/min    Comment: (NOTE) The eGFR has been calculated using the CKD EPI equation. This calculation has not been validated in all clinical situations. eGFR's persistently <60 mL/min signify possible Chronic Kidney Disease.    Anion gap 9 5 - 15    Comment: Performed at [facility] [address] [place], [place] [address]  CBC     Status: Abnormal   Collection Time: 03/08/18  4:57 PM  Result Value Ref Range   WBC 9.2 4.0 - 10.5 K/uL   RBC 5.88 (H) 4.22 - 5.81 MIL/uL   Hemoglobin 16.1 13.0 - 17.0 g/dL   HCT 49.4 39.0 - 52.0 %   MCV 84.0 78.0 - 100.0 fL   MCH 27.4 26.0 - 34.0 pg   MCHC 32.6 30.0 - 36.0 g/dL   RDW 13.0 11.5 - 15.5 %   Platelets 166 150 - 400 K/uL    Comment: Performed at [facility] [address], [place], [place] [address]  I-stat troponin, ED     Status: None   Collection Time: 03/08/18  5:14 PM  Result Value Ref Range   Troponin i, poc 0.01 0.00 - 0.08 ng/mL   Comment 3            Comment: Due to the release kinetics of cTnI, a negative result within the first hours of the onset of symptoms does not rule out myocardial infarction with certainty. If myocardial infarction is still suspected, repeat the test at appropriate intervals.     HIV antibody (Routine Testing)     Status: None   Collection Time: 03/09/18  2:44 AM  Result Value Ref Range   HIV Screen 4th Generation wRfx Non Reactive Non Reactive    Comment: (NOTE) Performed At: [facility] [place], [place] [id number] Rush Farmer MD [id number]   Troponin I     Status: None   Collection Time: 03/09/18  2:44 AM  Result Value Ref Range   Troponin I <0.03 <0.03 ng/mL    Comment: Performed at [facility], [place] [address], [place], [place] [address]  Lipid panel     Status: Abnormal   Collection Time: 03/09/18  2:44 AM  Result Value Ref Range   Cholesterol 154 0 - 200 mg/dL   Triglycerides 318 (H) <150 mg/dL   HDL 27 (L) >40 mg/dL   Total CHOL/HDL Ratio 5.7 RATIO   VLDL 64 (H) 0 - 40 mg/dL   LDL Cholesterol 63 0 - 99 mg/dL    Comment:        Total Cholesterol/HDL:CHD Risk Coronary Heart Disease Risk Table                     Men   Women  1/2 Average Risk   3.4   3.3  Average Risk       5.0   4.4  2 X Average Risk   9.6   7.1  3 X Average Risk  23.4   11.0        Use the calculated Patient Ratio above and the CHD Risk Table to determine the patient's CHD Risk.        ATP III CLASSIFICATION (LDL):  <100     mg/dL   Optimal  100-129  mg/dL   Near or Above                    Optimal  130-159  mg/dL   Borderline  160-189  mg/dL   High  >190     mg/dL   Very High Performed at [facility] [address], [place], [place] [address]   MRSA PCR Screening     Status: None   Collection Time: 03/09/18  3:07 AM  Result Value Ref Range   MRSA by PCR NEGATIVE NEGATIVE    Comment:        The GeneXpert MRSA  Assay (FDA approved for NASAL specimens only), is one component of a comprehensive MRSA colonization surveillance program. It is not intended to diagnose MRSA infection nor to guide or monitor treatment for MRSA infections. Performed at [facility], [place] [address], [place], [place] [address]   Glucose,  capillary     Status: Abnormal   Collection Time: 03/09/18  7:59 AM  Result Value Ref Range   Glucose-Capillary 167 (H) 70 - 99 mg/dL  Heparin level (unfractionated)     Status: None   Collection Time: 03/09/18  9:13 AM  Result Value Ref Range   Heparin Unfractionated 0.54 0.30 - 0.70 IU/mL    Comment: (NOTE) If heparin results are below expected values, and patient dosage has  been confirmed, suggest follow up testing of antithrombin III levels. Performed at [facility], [place] [address] [place], [place] [address]   Troponin I     Status: None   Collection Time: 03/09/18  9:13 AM  Result Value Ref Range   Troponin I <0.03 <0.03 ng/mL    Comment: Performed at [facility] [address], [place], [place] [address]  Basic metabolic panel     Status: Abnormal   Collection Time: 03/09/18  9:13 AM  Result Value Ref Range   Sodium 137 135 - 145 mmol/L   Potassium 4.2 3.5 - 5.1 mmol/L   Chloride 104 98 - 111 mmol/L   CO2 25 22 - 32 mmol/L   Glucose, Bld 167 (H) 70 - 99 mg/dL   BUN 17 6 - 20 mg/dL   Creatinine, Ser 0.93 0.61 - 1.24 mg/dL   Calcium 8.9 8.9 - 10.3 mg/dL   GFR calc non Af Amer >60 >60 mL/min   GFR calc Af Amer >60 >60 mL/min    Comment: (NOTE) The eGFR has been calculated using the CKD EPI equation. This calculation has not been validated in all clinical situations. eGFR's persistently <60 mL/min signify possible Chronic Kidney Disease.    Anion gap 8 5 - 15    Comment: Performed at [facility] [address] [place], [place] [address]  CBC     Status: Abnormal   Collection Time: 03/09/18  9:13 AM  Result Value Ref Range   WBC 5.9 4.0 - 10.5 K/uL   RBC 5.29 4.22 - 5.81 MIL/uL   Hemoglobin 14.8 13.0 - 17.0 g/dL   HCT 44.6 39.0 - 52.0 %   MCV 84.3 78.0 - 100.0 fL   MCH 28.0 26.0 - 34.0 pg   MCHC 33.2 30.0 - 36.0 g/dL   RDW 13.1 11.5 - 15.5 %   Platelets 127 (L) 150 - 400 K/uL    Comment: Performed at [facility], [place] [address] [place], [place] [address]  Protime-INR     Status: None   Collection Time: 03/09/18  9:13 AM  Result Value Ref Range   Prothrombin Time 14.9 11.4 - 15.2 seconds   INR 1.18     Comment: Performed at [facility] [address] [place], [place] [address]  Troponin I     Status: None   Collection Time: 03/09/18  1:46 PM  Result Value Ref Range   Troponin I <0.03 <0.03 ng/mL    Comment: Performed at [facility] [address] [place], [place] [address]  Heparin level (unfractionated)     Status: Abnormal   Collection Time: 03/09/18 10:37 PM  Result Value Ref Range  Heparin Unfractionated 0.28 (L) 0.30 - 0.70 IU/mL    Comment: (NOTE) If heparin results are below expected values, and patient dosage has  been confirmed, suggest follow up testing of antithrombin III levels. Performed at [facility], [place] [address], [place], [place] [address]   Heparin level (unfractionated)     Status: None   Collection Time: 03/10/18  6:28 AM  Result Value Ref Range   Heparin Unfractionated 0.56 0.30 - 0.70 IU/mL    Comment: (NOTE) If heparin results are below expected values, and patient dosage has  been confirmed, suggest follow up testing of antithrombin III levels. Performed at [facility], [place] [address], [place], [place] [address]   CBC     Status: Abnormal   Collection Time: 03/10/18  6:28 AM  Result Value Ref Range   WBC 6.2 4.0 - 10.5 K/uL   RBC 5.34 4.22 - 5.81 MIL/uL   Hemoglobin 14.8 13.0 - 17.0 g/dL   HCT 44.9 39.0 - 52.0 %   MCV 84.1 78.0 - 100.0 fL   MCH 27.7 26.0 - 34.0 pg   MCHC 33.0 30.0 - 36.0 g/dL   RDW 13.1 11.5 - 15.5 %   Platelets 119 (L) 150 - 400 K/uL    Comment: SPECIMEN CHECKED FOR CLOTS REPEATED TO VERIFY PLATELET COUNT CONFIRMED BY SMEAR Performed at [facility], [place] [address] [place], [place] [address]     ECG   N/A  Telemetry   Sinus rhythm - Personally Reviewed  Radiology    Dg Chest 2 View  Result Date:  03/08/2018 CLINICAL DATA:  Intermittent chest pain EXAM: CHEST - 2 VIEW COMPARISON:  03/07/2018 FINDINGS: No focal airspace disease or effusion. Coarse chronic appearing bronchitic changes. Stable cardiomediastinal silhouette. Aortic atherosclerosis. No pneumothorax. IMPRESSION: No active cardiopulmonary disease.  Bronchitic changes. Electronically Signed   By: Donavan Foil M.D.   On: 03/08/2018 18:11    Cardiac Studies   LEFT HEART CATH AND CORONARY ANGIOGRAPHY  Conclusion    Severe three-vessel coronary artery disease.  Moderate three-vessel coronary calcification.  Greater than 95% proximal LAD and 70% mid to distal.  First diagonal with multifocal 90% stenosis.  The LAD wraps around the left ventricular apex.  Ramus intermedius with proximal and mid 90% stenoses.  Circumflex is totally occluded after a large first obtuse marginal.  The obtuse marginal contains 70% small to mid stenosis.  Left to left collaterals to the occluded segment.  Dominant right coronary totally occluded proximal to mid segment with right to right and left to right collaterals.  Mild inferior hypokinesis.  EF 50 to 55%.  Normal LVEDP  RECOMMENDATIONS:   TCTS consultation to consider multivessel revascularization.  Resume IV heparin  Aggressive secondary risk factor modification: High intensity statin therapy, glycemic control, start low-dose beta-blocker therapy, aspirin, and should be hospitalized until revascularization can occur.  Recommend Aspirin 37m daily for moderate CAD.    Assessment   1. Principal Problem: 2.   NSTEMI (non-ST elevated myocardial infarction) ([facility] 3. Active Problems: 4.   Controlled type 2 diabetes mellitus without complication, without long-term current use of insulin ([place] 5.   Hyperlipidemia associated with type 2 diabetes mellitus ([place] 6.   Essential hypertension 7.   Plan   Plan for CABG Monday- wife says patient wants to be able to go outside in a  wheelchair for a short while. Not sure if this is possible based on unit policies since he is on heparin  and telemetry. Will d/w charge nurse.  Time Spent Directly with Patient:  I have spent a total of 15 minutes with the patient reviewing hospital notes, telemetry, EKGs, labs and examining the patient as well as establishing an assessment and plan that was discussed personally with the patient.  > 50% of time was spent in direct patient care.  Length of Stay:  LOS: 1 day   Pixie Casino, MD, [facility] [place] Director of the Advanced Lipid Disorders &  Cardiovascular Risk Reduction Clinic Diplomate of the American Board of Clinical Lipidology Attending Cardiologist  Direct Dial: [phone]  Fax: [phone]  Website:  [email or web address] 03/10/2018, 8:35 AM

## 2018-03-11 ENCOUNTER — Encounter (HOSPITAL_COMMUNITY): Payer: Self-pay | Admitting: Anesthesiology

## 2018-03-11 LAB — CBC
HCT: 44.1 % (ref 39.0–52.0)
Hemoglobin: 14.3 g/dL (ref 13.0–17.0)
MCH: 27.4 pg (ref 26.0–34.0)
MCHC: 32.4 g/dL (ref 30.0–36.0)
MCV: 84.6 fL (ref 78.0–100.0)
Platelets: 127 10*3/uL — ABNORMAL LOW (ref 150–400)
RBC: 5.21 MIL/uL (ref 4.22–5.81)
RDW: 12.7 % (ref 11.5–15.5)
WBC: 6.3 10*3/uL (ref 4.0–10.5)

## 2018-03-11 LAB — COMPREHENSIVE METABOLIC PANEL
ALT: 23 U/L (ref 0–44)
AST: 21 U/L (ref 15–41)
Albumin: 3.2 g/dL — ABNORMAL LOW (ref 3.5–5.0)
Alkaline Phosphatase: 52 U/L (ref 38–126)
Anion gap: 8 (ref 5–15)
BUN: 11 mg/dL (ref 6–20)
CO2: 27 mmol/L (ref 22–32)
Calcium: 8.8 mg/dL — ABNORMAL LOW (ref 8.9–10.3)
Chloride: 102 mmol/L (ref 98–111)
Creatinine, Ser: 0.95 mg/dL (ref 0.61–1.24)
GFR calc Af Amer: >60 mL/min (ref >60)
GFR calc non Af Amer: >60 mL/min (ref >60)
Glucose, Bld: 186 mg/dL — ABNORMAL HIGH (ref 70–99)
Potassium: 4 mmol/L (ref 3.5–5.1)
Sodium: 137 mmol/L (ref 135–145)
Total Bilirubin: 0.7 mg/dL (ref 0.3–1.2)
Total Protein: 7 g/dL (ref 6.5–8.1)

## 2018-03-11 LAB — PROTIME-INR
INR: 1.12
Prothrombin Time: 14.3 seconds (ref 11.4–15.2)

## 2018-03-11 LAB — TYPE AND SCREEN
ABO/RH(D): B POS
Antibody Screen: NEGATIVE

## 2018-03-11 LAB — URINALYSIS, COMPLETE (UACMP) WITH MICROSCOPIC
Bacteria, UA: NONE SEEN
Bilirubin Urine: NEGATIVE
Glucose, UA: 50 mg/dL — AB
Hgb urine dipstick: NEGATIVE
Ketones, ur: NEGATIVE mg/dL
Leukocytes, UA: NEGATIVE
Nitrite: NEGATIVE
Protein, ur: NEGATIVE mg/dL
Specific Gravity, Urine: 1.012 (ref 1.005–1.030)
pH: 7 (ref 5.0–8.0)

## 2018-03-11 LAB — ABO/RH: ABO/RH(D): B POS

## 2018-03-11 LAB — HEMOGLOBIN A1C
Hgb A1c MFr Bld: 8 % — ABNORMAL HIGH (ref 4.8–5.6)
Mean Plasma Glucose: 182.9 mg/dL

## 2018-03-11 LAB — APTT: aPTT: 79 seconds — ABNORMAL HIGH (ref 24–36)

## 2018-03-11 LAB — HEPARIN LEVEL (UNFRACTIONATED): Heparin Unfractionated: 0.45 IU/mL (ref 0.30–0.70)

## 2018-03-11 MED ORDER — CHLORHEXIDINE GLUCONATE CLOTH 2 % EX PADS: 6.0000 | MEDICATED_PAD | Freq: Once | CUTANEOUS | Status: DC

## 2018-03-11 MED ORDER — METOPROLOL TARTRATE 12.5 MG HALF TABLET: 12.5000 mg | ORAL_TABLET | Freq: Two times a day (BID) | ORAL | Status: DC

## 2018-03-11 MED ORDER — CHLORHEXIDINE GLUCONATE CLOTH 2 % EX PADS
6.0000 | MEDICATED_PAD | Freq: Once | CUTANEOUS | Status: AC
  Administered 2018-03-11: 6 via TOPICAL

## 2018-03-11 MED ORDER — BISACODYL 5 MG PO TBEC: 5.0000 mg | DELAYED_RELEASE_TABLET | Freq: Once | ORAL | Status: DC

## 2018-03-11 MED ORDER — METOPROLOL TARTRATE 12.5 MG HALF TABLET
12.5000 mg | ORAL_TABLET | Freq: Two times a day (BID) | ORAL | Status: DC
  Administered 2018-03-11 (×2): 12.5 mg via ORAL
  Filled 2018-03-11 (×2): qty 1

## 2018-03-11 MED ORDER — TEMAZEPAM 15 MG PO CAPS: 15.0000 mg | ORAL_CAPSULE | Freq: Once | ORAL | Status: DC | PRN

## 2018-03-11 MED ORDER — CHLORHEXIDINE GLUCONATE 0.12 % MT SOLN
15.0000 mL | Freq: Once | OROMUCOSAL | Status: AC
  Administered 2018-03-11: 15 mL via OROMUCOSAL

## 2018-03-11 MED ORDER — METOPROLOL TARTRATE 12.5 MG HALF TABLET: 12.5000 mg | ORAL_TABLET | Freq: Once | ORAL | Status: AC

## 2018-03-11 MED ORDER — ALPRAZOLAM 0.25 MG PO TABS: 0.2500 mg | ORAL_TABLET | ORAL | Status: DC | PRN

## 2018-03-11 NOTE — Progress Notes (Signed)
DAILY PROGRESS NOTE   Patient Name: Ronald Stephens Date of Encounter: 03/11/2018  Chief Complaint   No events overnight  Patient Profile   [name] is a 57 year old male with old inferior wall MI on ECG with diabetes, hypertension, hyperlipidemia who as a child underwent heart catheterization after concerns of congenital heart disease but no correction needed to be performed who has been experiencing anginal discomfort substernal activity relieved with rest accelerated  Subjective   No complaints. Ambulated yesterday without difficulty. Plan for CABG tomorrow - net negative 670 cc overnight, but weight down 5 lbs.   Objective   Vitals:   03/10/18 1639 03/10/18 2011 03/11/18 0000 03/11/18 0417  BP: 135/68 134/79 (!) 144/84 (!) 144/81  Pulse: (!) 51 60 60 (!) 57  Resp: (!) 28 (!) 24 (!) 24 12  Temp: 98.6 F (37 C) 98.5 F (36.9 C) 98.5 F (36.9 C) 97.8 F (36.6 C)  TempSrc: Oral Oral Oral Axillary  SpO2: 96% 95% 95% 97%  Weight:      Height:        Intake/Output Summary (Last 24 hours) at 03/11/2018 0828 Last data filed at 03/10/2018 2110 Gross per 24 hour  Intake 480 ml  Output 1150 ml  Net -670 ml   Filed Weights   03/08/18 1657 03/09/18 0334  Weight: 208 lb (94.3 kg) 203 lb 14.8 oz (92.5 kg)    Physical Exam   General appearance: alert and no distress Lungs: clear to auscultation bilaterally Heart: regular rate and rhythm, S1, S2 normal, no murmur, click, rub or gallop Extremities: extremities normal, atraumatic, no cyanosis or edema Neurologic: Grossly normal  Inpatient Medications    Scheduled Meds: . aspirin EC  81 mg Oral Daily  . atorvastatin  80 mg Oral Daily  . bisacodyl  5 mg Oral Once  . [START ON 03/12/2018] chlorhexidine  15 mL Mouth/Throat Once  . Chlorhexidine Gluconate Cloth  6 each Topical Once   And  . Chlorhexidine Gluconate Cloth  6 each Topical Once  . [START ON 03/12/2018] heparin-papaverine-plasmalyte irrigation    Irrigation To OR  . [START ON 03/12/2018] magnesium sulfate  40 mEq Other To OR  . [START ON 03/12/2018] metoprolol tartrate  12.5 mg Oral Once  . metoprolol tartrate  25 mg Oral BID  . pantoprazole  40 mg Oral Daily  . [START ON 03/12/2018] potassium chloride  80 mEq Other To OR  . sodium chloride flush  3 mL Intravenous Q12H  . thiamine  100 mg Oral Daily  . [START ON 03/12/2018] tranexamic acid  15 mg/kg Intravenous To OR  . [START ON 03/12/2018] tranexamic acid  2 mg/kg Intracatheter To OR    Continuous Infusions: . sodium chloride    . [START ON 03/12/2018] cefUROXime (ZINACEF)  IV    . [START ON 03/12/2018] cefUROXime (ZINACEF)  IV    . [START ON 03/12/2018] dexmedetomidine    . [START ON 03/12/2018] DOPamine    . [START ON 03/12/2018] epinephrine    . [START ON 03/12/2018] heparin 30,000 units/NS 1000 mL solution for CELLSAVER    . heparin 1,400 Units/hr (03/10/18 2110)  . [START ON 03/12/2018] insulin (NOVOLIN-R) infusion    . [START ON 03/12/2018] milrinone    . [START ON 03/12/2018] nitroGLYCERIN    . [START ON 03/12/2018] phenylephrine 32m/250mL NS (0.038mml) infusion    . [START ON 03/12/2018] tranexamic acid (CYKLOKAPRON) infusion ([facility])    . [START ON 03/12/2018] vancomycin      PRN  Meds: sodium chloride, acetaminophen, ALPRAZolam, nitroGLYCERIN, ondansetron (ZOFRAN) IV, oxyCODONE, sodium chloride flush, temazepam, traMADol   Labs   Results for orders placed or performed during the hospital encounter of 03/08/18 (from the past 48 hour(s))  Heparin level (unfractionated)     Status: None   Collection Time: 03/09/18  9:13 AM  Result Value Ref Range   Heparin Unfractionated 0.54 0.30 - 0.70 IU/mL    Comment: (NOTE) If heparin results are below expected values, and patient dosage has  been confirmed, suggest follow up testing of antithrombin III levels. Performed at [facility], [place] [address] [place], [place] [address]   Troponin I     Status: None   Collection Time:  03/09/18  9:13 AM  Result Value Ref Range   Troponin I <0.03 <0.03 ng/mL    Comment: Performed at [facility] [address] [place], [place] [address]  Basic metabolic panel     Status: Abnormal   Collection Time: 03/09/18  9:13 AM  Result Value Ref Range   Sodium 137 135 - 145 mmol/L   Potassium 4.2 3.5 - 5.1 mmol/L   Chloride 104 98 - 111 mmol/L   CO2 25 22 - 32 mmol/L   Glucose, Bld 167 (H) 70 - 99 mg/dL   BUN 17 6 - 20 mg/dL   Creatinine, Ser 0.93 0.61 - 1.24 mg/dL   Calcium 8.9 8.9 - 10.3 mg/dL   GFR calc non Af Amer >60 >60 mL/min   GFR calc Af Amer >60 >60 mL/min    Comment: (NOTE) The eGFR has been calculated using the CKD EPI equation. This calculation has not been validated in all clinical situations. eGFR's persistently <60 mL/min signify possible Chronic Kidney Disease.    Anion gap 8 5 - 15    Comment: Performed at [facility] [address] [place], [place] [address]  CBC     Status: Abnormal   Collection Time: 03/09/18  9:13 AM  Result Value Ref Range   WBC 5.9 4.0 - 10.5 K/uL   RBC 5.29 4.22 - 5.81 MIL/uL   Hemoglobin 14.8 13.0 - 17.0 g/dL   HCT 44.6 39.0 - 52.0 %   MCV 84.3 78.0 - 100.0 fL   MCH 28.0 26.0 - 34.0 pg   MCHC 33.2 30.0 - 36.0 g/dL   RDW 13.1 11.5 - 15.5 %   Platelets 127 (L) 150 - 400 K/uL    Comment: Performed at [facility], [place] [address], [place], [place] [address]  Protime-INR     Status: None   Collection Time: 03/09/18  9:13 AM  Result Value Ref Range   Prothrombin Time 14.9 11.4 - 15.2 seconds   INR 1.18     Comment: Performed at [facility] [address] [place], [place] [address]  Troponin I     Status: None   Collection Time: 03/09/18  1:46 PM  Result Value Ref Range   Troponin I <0.03 <0.03 ng/mL    Comment: Performed at [facility] [address], [place], [place] [address]  Heparin level (unfractionated)     Status: Abnormal   Collection Time: 03/09/18 10:37 PM  Result Value Ref  Range   Heparin Unfractionated 0.28 (L) 0.30 - 0.70 IU/mL    Comment: (NOTE) If heparin results are below expected values, and patient dosage has  been confirmed, suggest follow up testing of antithrombin III levels. Performed at [facility], [address], [place], [place] [address]   Heparin level (unfractionated)     Status: None   Collection Time: 03/10/18  6:28 AM  Result Value Ref Range   Heparin Unfractionated 0.56 0.30 - 0.70 IU/mL    Comment: (NOTE) If heparin results are below expected values, and patient dosage has  been confirmed, suggest follow up testing of antithrombin III levels. Performed at [facility], [place] [address], [place], [place] [address]   CBC     Status: Abnormal   Collection Time: 03/10/18  6:28 AM  Result Value Ref Range   WBC 6.2 4.0 - 10.5 K/uL   RBC 5.34 4.22 - 5.81 MIL/uL   Hemoglobin 14.8 13.0 - 17.0 g/dL   HCT 44.9 39.0 - 52.0 %   MCV 84.1 78.0 - 100.0 fL   MCH 27.7 26.0 - 34.0 pg   MCHC 33.0 30.0 - 36.0 g/dL   RDW 13.1 11.5 - 15.5 %   Platelets 119 (L) 150 - 400 K/uL    Comment: SPECIMEN CHECKED FOR CLOTS REPEATED TO VERIFY PLATELET COUNT CONFIRMED BY SMEAR Performed at [facility], [place] [address], [place], [place] [address]   Heparin level (unfractionated)     Status: None   Collection Time: 03/11/18  3:03 AM  Result Value Ref Range   Heparin Unfractionated 0.45 0.30 - 0.70 IU/mL    Comment: (NOTE) If heparin results are below expected values, and patient dosage has  been confirmed, suggest follow up testing of antithrombin III levels. Performed at [facility], [place] [address], [place], [place] [address]   CBC     Status: Abnormal   Collection Time: 03/11/18  3:03 AM  Result Value Ref Range   WBC 6.3 4.0 - 10.5 K/uL   RBC 5.21 4.22 - 5.81 MIL/uL   Hemoglobin 14.3 13.0 - 17.0 g/dL   HCT 44.1 39.0 - 52.0 %   MCV 84.6 78.0 - 100.0 fL   MCH 27.4 26.0 - 34.0 pg   MCHC 32.4 30.0 - 36.0 g/dL   RDW 12.7  11.5 - 15.5 %   Platelets 127 (L) 150 - 400 K/uL    Comment: Performed at [facility], [place] [address] [place], [place] [address]  Type and screen [place]     Status: None   Collection Time: 03/11/18  6:12 AM  Result Value Ref Range   ABO/RH(D) B POS    Antibody Screen NEG    Sample Expiration      03/14/2018 Performed at [facility], [place] [address] [place], [place] [address]   Comprehensive metabolic panel     Status: Abnormal   Collection Time: 03/11/18  6:12 AM  Result Value Ref Range   Sodium 137 135 - 145 mmol/L   Potassium 4.0 3.5 - 5.1 mmol/L   Chloride 102 98 - 111 mmol/L   CO2 27 22 - 32 mmol/L   Glucose, Bld 186 (H) 70 - 99 mg/dL   BUN 11 6 - 20 mg/dL   Creatinine, Ser 0.95 0.61 - 1.24 mg/dL   Calcium 8.8 (L) 8.9 - 10.3 mg/dL   Total Protein 7.0 6.5 - 8.1 g/dL   Albumin 3.2 (L) 3.5 - 5.0 g/dL   AST 21 15 - 41 U/L   ALT 23 0 - 44 U/L   Alkaline Phosphatase 52 38 - 126 U/L   Total Bilirubin 0.7 0.3 - 1.2 mg/dL   GFR  calc non Af Amer >60 >60 mL/min   GFR calc Af Amer >60 >60 mL/min    Comment: (NOTE) The eGFR has been calculated using the CKD EPI equation. This calculation has not been validated in all clinical situations. eGFR's persistently <60 mL/min signify possible Chronic Kidney Disease.    Anion gap 8 5 - 15    Comment: Performed at [facility] [address] [place], [place] [address]  Hemoglobin A1c     Status: Abnormal   Collection Time: 03/11/18  6:12 AM  Result Value Ref Range   Hgb A1c MFr Bld 8.0 (H) 4.8 - 5.6 %    Comment: (NOTE) Pre diabetes:          5.7%-6.4% Diabetes:              >6.4% Glycemic control for   <7.0% adults with diabetes    Mean Plasma Glucose 182.9 mg/dL    Comment: Performed at [facility] [address], [place], [place] [address]  ABO/Rh     Status: None (Preliminary result)   Collection Time: 03/11/18  6:12 AM  Result Value Ref Range   ABO/RH(D)      B  POS Performed at [facility] [address] [place], [place] [address]     ECG   N/A  Telemetry   Sinus rhythm - Personally Reviewed  Radiology    No results found.  Cardiac Studies   LEFT HEART CATH AND CORONARY ANGIOGRAPHY  Conclusion    Severe three-vessel coronary artery disease.  Moderate three-vessel coronary calcification.  Greater than 95% proximal LAD and 70% mid to distal.  First diagonal with multifocal 90% stenosis.  The LAD wraps around the left ventricular apex.  Ramus intermedius with proximal and mid 90% stenoses.  Circumflex is totally occluded after a large first obtuse marginal.  The obtuse marginal contains 70% small to mid stenosis.  Left to left collaterals to the occluded segment.  Dominant right coronary totally occluded proximal to mid segment with right to right and left to right collaterals.  Mild inferior hypokinesis.  EF 50 to 55%.  Normal LVEDP  RECOMMENDATIONS:   TCTS consultation to consider multivessel revascularization.  Resume IV heparin  Aggressive secondary risk factor modification: High intensity statin therapy, glycemic control, start low-dose beta-blocker therapy, aspirin, and should be hospitalized until revascularization can occur.  Recommend Aspirin 29m daily for moderate CAD.    Assessment   Principal Problem:   NSTEMI (non-ST elevated myocardial infarction) ([facility] Active Problems:   Controlled type 2 diabetes mellitus without complication, without long-term current use of insulin ([facility]   Hyperlipidemia associated with type 2 diabetes mellitus ([facility]   Essential hypertension   Plan   Plan for CABG tomorrow. Labs stable today. Continue IV heparin.  Time Spent Directly with Patient:  I have spent a total of 15 minutes with the patient reviewing hospital notes, telemetry, EKGs, labs and examining the patient as well as establishing an assessment and plan that was discussed personally with the patient.   > 50% of time was spent in direct patient care.  Length of Stay:  LOS: 2 days   KPixie Casino MD, FCharleston Va Medical Center FMarieDirector of the Advanced Lipid Disorders &  Cardiovascular Risk Reduction Clinic Diplomate of the American Board of Clinical Lipidology Attending Cardiologist  Direct Dial: [phone] Fax: [phone] Website:  [email or web address]  KNadean CorwinHilty 03/11/2018, 8:28 AM

## 2018-03-11 NOTE — Progress Notes (Deleted)
Metoprolol held due to heart rate 50's -60's bpm

## 2018-03-11 NOTE — Progress Notes (Signed)
Signed           Show:Clear all [x] Manual[] Template[] Copied  Added by: [x] Dyann KiefYarber, Mikhail Hallenbeck A, RN   [] Hover for details   Metoprolol held due to heart rate 50's -60's bpm

## 2018-03-11 NOTE — Progress Notes (Signed)
ANTICOAGULATION CONSULT NOTE  Pharmacy Consult for heparin Indication: Multivessel disease/CABG planned   No Known Allergies  Patient Measurements: Height: 6' (182.9 cm) Weight: 203 lb 14.8 oz (92.5 kg) IBW/kg (Calculated) : 77.6  Vital Signs: Temp: 98 F (36.7 C) (07/28 1156) Temp Source: Oral (07/28 1156) BP: 132/70 (07/28 1156) Pulse Rate: 63 (07/28 1156)  Labs: Recent Labs    03/08/18 1657 03/09/18 0244  03/09/18 0913 03/09/18 1346 03/09/18 2237 03/10/18 0628 03/11/18 0303 03/11/18 0612  HGB 16.1  --   --  14.8  --   --  14.8 14.3  --   HCT 49.4  --   --  44.6  --   --  44.9 44.1  --   PLT 166  --   --  127*  --   --  119* 127*  --   APTT  --   --   --   --   --   --   --   --  79*  LABPROT  --   --   --  14.9  --   --   --   --  14.3  INR  --   --   --  1.18  --   --   --   --  1.12  HEPARINUNFRC  --   --    < > 0.54  --  0.28* 0.56 0.45  --   CREATININE 0.96  --   --  0.93  --   --   --   --  0.95  TROPONINI  --  <0.03  --  <0.03 <0.03  --   --   --   --    < > = values in this interval not displayed.    Estimated Creatinine Clearance: 94.2 mL/min (by C-G formula based on SCr of 0.95 mg/dL).   Medical History: Past Medical History:  Diagnosis Date  . Arthritis   . Bilateral ankle fractures 2014  . Diabetes mellitus   . Heart murmur    as child  . Hyperlipidemia   . Hypertension   . Right forearm fracture 2014  . Tobacco abuse     Assessment: 57yo male c/o CP w/ radiation to B/LUE intermittently x661yr but worse over last mo. Post cath with 3v CAD plan CABG next week.  Restarted heparin after sheath removal.  Heparin level 0.45 units/ml on heparin drip 1400 uts/hr CBC ok no bleeding.  Goal of Therapy:  Heparin level 0.3-0.7 units/ml Monitor platelets by anticoagulation protocol: Yes   Plan:  Continue  heparin gtt 1400 units/hr - turn off for CABG in am Monitor heparin levels, CBC, s/s bleeding daily.  Leota SauersLisa Shadana Pry Pharm.D. CPP, BCPS Clinical  Pharmacist [phone] 03/11/2018 2:28 PM

## 2018-03-11 NOTE — Anesthesia Preprocedure Evaluation (Addendum)
Anesthesia Evaluation  Patient identified by MRN, date of birth, ID band Patient awake    Reviewed: Allergy & Precautions, NPO status , Patient's Chart, lab work & pertinent test results  Airway Mallampati: III  TM Distance: >3 FB Neck ROM: Full    Dental  (+) Missing, Poor Dentition, Dental Advisory Given,    Pulmonary COPD, Current Smoker,    breath sounds clear to auscultation       Cardiovascular hypertension, Pt. on medications and Pt. on home beta blockers + Past MI   Rhythm:Regular Rate:Normal     Neuro/Psych negative neurological ROS  negative psych ROS   GI/Hepatic negative GI ROS, Neg liver ROS,   Endo/Other  diabetes, Type 2, Oral Hypoglycemic Agents  Renal/GU negative Renal ROS     Musculoskeletal  (+) Arthritis ,   Abdominal Normal abdominal exam  (+)   Peds  Hematology   Anesthesia Other Findings   Reproductive/Obstetrics                            Lab Results  Component Value Date   WBC 6.3 03/11/2018   HGB 14.3 03/11/2018   HCT 44.1 03/11/2018   MCV 84.6 03/11/2018   PLT 127 (L) 03/11/2018   Lab Results  Component Value Date   CREATININE 0.95 03/11/2018   BUN 11 03/11/2018   NA 137 03/11/2018   K 4.0 03/11/2018   CL 102 03/11/2018   CO2 27 03/11/2018   Lab Results  Component Value Date   INR 1.12 03/11/2018   INR 1.18 03/09/2018   EKG: NSR  Echo: - Left ventricle: The cavity size was normal. Wall thickness was   increased in a pattern of mild LVH. Systolic function was normal.   The estimated ejection fraction was in the range of 55% to 60%.   Wall motion was normal; there were no regional wall motion   abnormalities. Doppler parameters are consistent with abnormal   left ventricular relaxation (grade 1 diastolic dysfunction).  Anesthesia Physical Anesthesia Plan  ASA: IV  Anesthesia Plan: General   Post-op Pain Management:    Induction:  Intravenous  PONV Risk Score and Plan: 2 and Treatment may vary due to age or medical condition  Airway Management Planned: Oral ETT  Additional Equipment: CVP, Arterial line, PA Cath and TEE  Intra-op Plan:   Post-operative Plan: Post-operative intubation/ventilation  Informed Consent: I have reviewed the patients History and Physical, chart, labs and discussed the procedure including the risks, benefits and alternatives for the proposed anesthesia with the patient or authorized representative who has indicated his/her understanding and acceptance.   Dental advisory given  Plan Discussed with: CRNA  Anesthesia Plan Comments:        Anesthesia Quick Evaluation

## 2018-03-12 ENCOUNTER — Inpatient Hospital Stay (HOSPITAL_COMMUNITY): Payer: 59

## 2018-03-12 ENCOUNTER — Inpatient Hospital Stay (HOSPITAL_COMMUNITY): Payer: 59 | Admitting: Certified Registered Nurse Anesthetist

## 2018-03-12 ENCOUNTER — Encounter (HOSPITAL_COMMUNITY)
Admission: EM | Disposition: A | Discharge status: Home / Self Care | Payer: Self-pay | Source: Home / Self Care | Attending: Cardiothoracic Surgery

## 2018-03-12 ENCOUNTER — Other Ambulatory Visit: Payer: Self-pay

## 2018-03-12 ENCOUNTER — Encounter (HOSPITAL_COMMUNITY): Payer: Self-pay | Admitting: Certified Registered Nurse Anesthetist

## 2018-03-12 DIAGNOSIS — Z951 Presence of aortocoronary bypass graft: Secondary | ICD-10-CM

## 2018-03-12 DIAGNOSIS — J449 Chronic obstructive pulmonary disease, unspecified: Secondary | ICD-10-CM

## 2018-03-12 DIAGNOSIS — I2510 Atherosclerotic heart disease of native coronary artery without angina pectoris: Secondary | ICD-10-CM | POA: Diagnosis present

## 2018-03-12 DIAGNOSIS — I25110 Atherosclerotic heart disease of native coronary artery with unstable angina pectoris: Secondary | ICD-10-CM

## 2018-03-12 DIAGNOSIS — I214 Non-ST elevation (NSTEMI) myocardial infarction: Secondary | ICD-10-CM

## 2018-03-12 HISTORY — PX: TEE WITHOUT CARDIOVERSION: SHX5443

## 2018-03-12 HISTORY — PX: CORONARY ARTERY BYPASS GRAFT: SHX141

## 2018-03-12 HISTORY — DX: Presence of aortocoronary bypass graft: Z95.1

## 2018-03-12 HISTORY — PX: MEDIASTERNOTOMY: SHX5084

## 2018-03-12 HISTORY — DX: Chronic obstructive pulmonary disease, unspecified: J44.9

## 2018-03-12 LAB — CBC
HCT: 45 % (ref 39.0–52.0)
HCT: 34.9 % — ABNORMAL LOW (ref 39.0–52.0)
HEMATOCRIT: 36.2 % — AB (ref 39.0–52.0)
Hemoglobin: 14.9 g/dL (ref 13.0–17.0)
Hemoglobin: 11.9 g/dL — ABNORMAL LOW (ref 13.0–17.0)
Hemoglobin: 11.5 g/dL — ABNORMAL LOW (ref 13.0–17.0)
MCH: 27.8 pg (ref 26.0–34.0)
MCH: 27.7 pg (ref 26.0–34.0)
MCH: 28 pg (ref 26.0–34.0)
MCHC: 33.1 g/dL (ref 30.0–36.0)
MCHC: 32.9 g/dL (ref 30.0–36.0)
MCHC: 33 g/dL (ref 30.0–36.0)
MCV: 84 fL (ref 78.0–100.0)
MCV: 84.2 fL (ref 78.0–100.0)
MCV: 85.1 fL (ref 78.0–100.0)
PLATELETS: 127 10*3/uL — AB (ref 150–400)
Platelets: 88 10*3/uL — ABNORMAL LOW (ref 150–400)
Platelets: 94 10*3/uL — ABNORMAL LOW (ref 150–400)
RBC: 5.36 MIL/uL (ref 4.22–5.81)
RBC: 4.3 MIL/uL (ref 4.22–5.81)
RBC: 4.1 MIL/uL — ABNORMAL LOW (ref 4.22–5.81)
RDW: 12.9 % (ref 11.5–15.5)
RDW: 12.9 % (ref 11.5–15.5)
RDW: 13.1 % (ref 11.5–15.5)
WBC: 7.4 10*3/uL (ref 4.0–10.5)
WBC: 9.3 10*3/uL (ref 4.0–10.5)
WBC: 8.7 10*3/uL (ref 4.0–10.5)

## 2018-03-12 LAB — POCT I-STAT, CHEM 8
BUN: 11 mg/dL (ref 6–20)
BUN: 10 mg/dL (ref 6–20)
BUN: 8 mg/dL (ref 6–20)
BUN: 8 mg/dL (ref 6–20)
BUN: 7 mg/dL (ref 6–20)
BUN: 8 mg/dL (ref 6–20)
CALCIUM ION: 1.12 mmol/L — AB (ref 1.15–1.40)
CHLORIDE: 101 mmol/L (ref 98–111)
CHLORIDE: 101 mmol/L (ref 98–111)
CHLORIDE: 103 mmol/L (ref 98–111)
CREATININE: 0.7 mg/dL (ref 0.61–1.24)
CREATININE: 0.7 mg/dL (ref 0.61–1.24)
Calcium, Ion: 1.22 mmol/L (ref 1.15–1.40)
Calcium, Ion: 1.19 mmol/L (ref 1.15–1.40)
Calcium, Ion: 1.01 mmol/L — ABNORMAL LOW (ref 1.15–1.40)
Calcium, Ion: 1.04 mmol/L — ABNORMAL LOW (ref 1.15–1.40)
Calcium, Ion: 1.01 mmol/L — ABNORMAL LOW (ref 1.15–1.40)
Chloride: 100 mmol/L (ref 98–111)
Chloride: 102 mmol/L (ref 98–111)
Chloride: 100 mmol/L (ref 98–111)
Creatinine, Ser: 0.7 mg/dL (ref 0.61–1.24)
Creatinine, Ser: 0.6 mg/dL — ABNORMAL LOW (ref 0.61–1.24)
Creatinine, Ser: 0.7 mg/dL (ref 0.61–1.24)
Creatinine, Ser: 0.7 mg/dL (ref 0.61–1.24)
GLUCOSE: 128 mg/dL — AB (ref 70–99)
GLUCOSE: 124 mg/dL — AB (ref 70–99)
Glucose, Bld: 164 mg/dL — ABNORMAL HIGH (ref 70–99)
Glucose, Bld: 143 mg/dL — ABNORMAL HIGH (ref 70–99)
Glucose, Bld: 142 mg/dL — ABNORMAL HIGH (ref 70–99)
Glucose, Bld: 139 mg/dL — ABNORMAL HIGH (ref 70–99)
HCT: 43 % (ref 39.0–52.0)
HCT: 43 % (ref 39.0–52.0)
HCT: 34 % — ABNORMAL LOW (ref 39.0–52.0)
HCT: 32 % — ABNORMAL LOW (ref 39.0–52.0)
HCT: 31 % — ABNORMAL LOW (ref 39.0–52.0)
HCT: 33 % — ABNORMAL LOW (ref 39.0–52.0)
HEMOGLOBIN: 14.6 g/dL (ref 13.0–17.0)
HEMOGLOBIN: 10.9 g/dL — AB (ref 13.0–17.0)
Hemoglobin: 14.6 g/dL (ref 13.0–17.0)
Hemoglobin: 11.6 g/dL — ABNORMAL LOW (ref 13.0–17.0)
Hemoglobin: 10.5 g/dL — ABNORMAL LOW (ref 13.0–17.0)
Hemoglobin: 11.2 g/dL — ABNORMAL LOW (ref 13.0–17.0)
POTASSIUM: 3.9 mmol/L (ref 3.5–5.1)
POTASSIUM: 3.3 mmol/L — AB (ref 3.5–5.1)
POTASSIUM: 4 mmol/L (ref 3.5–5.1)
Potassium: 3.7 mmol/L (ref 3.5–5.1)
Potassium: 3.6 mmol/L (ref 3.5–5.1)
Potassium: 3 mmol/L — ABNORMAL LOW (ref 3.5–5.1)
SODIUM: 139 mmol/L (ref 135–145)
SODIUM: 141 mmol/L (ref 135–145)
Sodium: 140 mmol/L (ref 135–145)
Sodium: 141 mmol/L (ref 135–145)
Sodium: 139 mmol/L (ref 135–145)
Sodium: 140 mmol/L (ref 135–145)
TCO2: 28 mmol/L (ref 22–32)
TCO2: 26 mmol/L (ref 22–32)
TCO2: 28 mmol/L (ref 22–32)
TCO2: 26 mmol/L (ref 22–32)
TCO2: 24 mmol/L (ref 22–32)
TCO2: 23 mmol/L (ref 22–32)

## 2018-03-12 LAB — MAGNESIUM: Magnesium: 3.2 mg/dL — ABNORMAL HIGH (ref 1.7–2.4)

## 2018-03-12 LAB — GLUCOSE, CAPILLARY
GLUCOSE-CAPILLARY: 126 mg/dL — AB (ref 70–99)
GLUCOSE-CAPILLARY: 133 mg/dL — AB (ref 70–99)
GLUCOSE-CAPILLARY: 119 mg/dL — AB (ref 70–99)
GLUCOSE-CAPILLARY: 133 mg/dL — AB (ref 70–99)
GLUCOSE-CAPILLARY: 132 mg/dL — AB (ref 70–99)
Glucose-Capillary: 125 mg/dL — ABNORMAL HIGH (ref 70–99)
Glucose-Capillary: 126 mg/dL — ABNORMAL HIGH (ref 70–99)
Glucose-Capillary: 151 mg/dL — ABNORMAL HIGH (ref 70–99)

## 2018-03-12 LAB — POCT I-STAT 3, ART BLOOD GAS (G3+)
ACID-BASE DEFICIT: 2 mmol/L (ref 0.0–2.0)
Acid-Base Excess: 1 mmol/L (ref 0.0–2.0)
Acid-Base Excess: 1 mmol/L (ref 0.0–2.0)
Acid-Base Excess: 1 mmol/L (ref 0.0–2.0)
BICARBONATE: 25.5 mmol/L (ref 20.0–28.0)
BICARBONATE: 23.1 mmol/L (ref 20.0–28.0)
BICARBONATE: 25.2 mmol/L (ref 20.0–28.0)
Bicarbonate: 27 mmol/L (ref 20.0–28.0)
Bicarbonate: 26.8 mmol/L (ref 20.0–28.0)
O2 SAT: 94 %
O2 SAT: 98 %
O2 SAT: 98 %
O2 Saturation: 100 %
O2 Saturation: 100 %
PCO2 ART: 39 mmHg (ref 32.0–48.0)
PCO2 ART: 41 mmHg (ref 32.0–48.0)
PCO2 ART: 43.7 mmHg (ref 32.0–48.0)
PH ART: 7.423 (ref 7.350–7.450)
PH ART: 7.37 (ref 7.350–7.450)
PO2 ART: 68 mmHg — AB (ref 83.0–108.0)
PO2 ART: 110 mmHg — AB (ref 83.0–108.0)
Patient temperature: 37.2
Patient temperature: 37.2
TCO2: 28 mmol/L (ref 22–32)
TCO2: 27 mmol/L (ref 22–32)
TCO2: 28 mmol/L (ref 22–32)
TCO2: 24 mmol/L (ref 22–32)
TCO2: 26 mmol/L (ref 22–32)
pCO2 arterial: 47.2 mmHg (ref 32.0–48.0)
pCO2 arterial: 47 mmHg (ref 32.0–48.0)
pH, Arterial: 7.366 (ref 7.350–7.450)
pH, Arterial: 7.36 (ref 7.350–7.450)
pH, Arterial: 7.36 (ref 7.350–7.450)
pO2, Arterial: 266 mmHg — ABNORMAL HIGH (ref 83.0–108.0)
pO2, Arterial: 363 mmHg — ABNORMAL HIGH (ref 83.0–108.0)
pO2, Arterial: 110 mmHg — ABNORMAL HIGH (ref 83.0–108.0)

## 2018-03-12 LAB — BASIC METABOLIC PANEL
Anion gap: 12 (ref 5–15)
BUN: 9 mg/dL (ref 6–20)
CO2: 27 mmol/L (ref 22–32)
Calcium: 8.9 mg/dL (ref 8.9–10.3)
Chloride: 99 mmol/L (ref 98–111)
Creatinine, Ser: 0.98 mg/dL (ref 0.61–1.24)
GFR calc Af Amer: >60 mL/min (ref >60)
GFR calc non Af Amer: >60 mL/min (ref >60)
Glucose, Bld: 170 mg/dL — ABNORMAL HIGH (ref 70–99)
Potassium: 3.6 mmol/L (ref 3.5–5.1)
Sodium: 138 mmol/L (ref 135–145)

## 2018-03-12 LAB — CREATININE, SERUM
Creatinine, Ser: 0.83 mg/dL (ref 0.61–1.24)
GFR calc Af Amer: >60 mL/min (ref >60)
GFR calc non Af Amer: >60 mL/min (ref >60)

## 2018-03-12 LAB — POCT I-STAT 4, (NA,K, GLUC, HGB,HCT)
GLUCOSE: 137 mg/dL — AB (ref 70–99)
HEMATOCRIT: 33 % — AB (ref 39.0–52.0)
HEMOGLOBIN: 11.2 g/dL — AB (ref 13.0–17.0)
POTASSIUM: 3.2 mmol/L — AB (ref 3.5–5.1)
Sodium: 141 mmol/L (ref 135–145)

## 2018-03-12 LAB — PLATELET COUNT: Platelets: 122 10*3/uL — ABNORMAL LOW (ref 150–400)

## 2018-03-12 LAB — PROTIME-INR
INR: 1.46
Prothrombin Time: 17.6 seconds — ABNORMAL HIGH (ref 11.4–15.2)

## 2018-03-12 LAB — APTT: aPTT: 30 seconds (ref 24–36)

## 2018-03-12 LAB — HEPARIN LEVEL (UNFRACTIONATED): Heparin Unfractionated: 0.45 IU/mL (ref 0.30–0.70)

## 2018-03-12 SURGERY — CORONARY ARTERY BYPASS GRAFTING (CABG)
Anesthesia: General | Site: Chest

## 2018-03-12 MED ORDER — SODIUM CHLORIDE 0.9 % IV SOLN
INTRAVENOUS | Status: DC
  Administered 2018-03-12 – 2018-03-13 (×2): via INTRAVENOUS

## 2018-03-12 MED ORDER — PROTAMINE SULFATE 10 MG/ML IV SOLN
INTRAVENOUS | Status: AC
  Filled 2018-03-12: qty 5

## 2018-03-12 MED ORDER — METOPROLOL TARTRATE 12.5 MG HALF TABLET
12.5000 mg | ORAL_TABLET | Freq: Two times a day (BID) | ORAL | Status: DC
  Administered 2018-03-12 – 2018-03-14 (×4): 12.5 mg via ORAL
  Filled 2018-03-12 (×4): qty 1

## 2018-03-12 MED ORDER — ASPIRIN 81 MG PO CHEW: 324.0000 mg | CHEWABLE_TABLET | Freq: Every day | ORAL | Status: DC

## 2018-03-12 MED ORDER — POTASSIUM CHLORIDE 10 MEQ/50ML IV SOLN
10.0000 meq | INTRAVENOUS | Status: AC
  Administered 2018-03-12 (×3): 10 meq via INTRAVENOUS

## 2018-03-12 MED ORDER — MIDAZOLAM HCL 2 MG/2ML IJ SOLN
2.0000 mg | INTRAMUSCULAR | Status: DC | PRN
Start: 2018-03-12 — End: 2018-03-14
  Filled 2018-03-12: qty 2

## 2018-03-12 MED ORDER — PROTAMINE SULFATE 10 MG/ML IV SOLN
INTRAVENOUS | Status: DC | PRN
  Administered 2018-03-12: 270 mg via INTRAVENOUS

## 2018-03-12 MED ORDER — LEVALBUTEROL HCL 0.63 MG/3ML IN NEBU
0.6300 mg | INHALATION_SOLUTION | Freq: Three times a day (TID) | RESPIRATORY_TRACT | Status: DC
  Administered 2018-03-12 – 2018-03-13 (×4): 0.63 mg via RESPIRATORY_TRACT
  Filled 2018-03-12 (×5): qty 3

## 2018-03-12 MED ORDER — NITROGLYCERIN 0.2 MG/ML ON CALL CATH LAB
INTRAVENOUS | Status: DC | PRN
  Administered 2018-03-12 (×2): 20 ug via INTRAVENOUS

## 2018-03-12 MED ORDER — ROCURONIUM BROMIDE 10 MG/ML (PF) SYRINGE
PREFILLED_SYRINGE | INTRAVENOUS | Status: AC
  Filled 2018-03-12: qty 20

## 2018-03-12 MED ORDER — NITROGLYCERIN IN D5W 200-5 MCG/ML-% IV SOLN: 0.0000 ug/min | INTRAVENOUS | Status: DC

## 2018-03-12 MED ORDER — ALBUMIN HUMAN 5 % IV SOLN
INTRAVENOUS | Status: DC | PRN
  Administered 2018-03-12 (×3): via INTRAVENOUS

## 2018-03-12 MED ORDER — FENTANYL CITRATE (PF) 250 MCG/5ML IJ SOLN
INTRAMUSCULAR | Status: AC
Start: 2018-03-12 — End: ?
  Filled 2018-03-12: qty 25

## 2018-03-12 MED ORDER — ALBUTEROL SULFATE HFA 108 (90 BASE) MCG/ACT IN AERS
INHALATION_SPRAY | RESPIRATORY_TRACT | Status: DC | PRN
  Administered 2018-03-12: 4 via RESPIRATORY_TRACT

## 2018-03-12 MED ORDER — MORPHINE SULFATE (PF) 2 MG/ML IV SOLN
2.0000 mg | INTRAVENOUS | Status: DC | PRN
  Administered 2018-03-12 – 2018-03-13 (×6): 2 mg via INTRAVENOUS
  Administered 2018-03-13: 4 mg via INTRAVENOUS
  Administered 2018-03-14: 2 mg via INTRAVENOUS
  Filled 2018-03-12 (×13): qty 1

## 2018-03-12 MED ORDER — ORAL CARE MOUTH RINSE
15.0000 mL | Freq: Two times a day (BID) | OROMUCOSAL | Status: DC
  Administered 2018-03-13 – 2018-03-17 (×3): 15 mL via OROMUCOSAL

## 2018-03-12 MED ORDER — INSULIN REGULAR BOLUS VIA INFUSION
0.0000 [IU] | Freq: Three times a day (TID) | INTRAVENOUS | Status: DC
  Filled 2018-03-12: qty 10

## 2018-03-12 MED ORDER — DEXMEDETOMIDINE HCL IN NACL 400 MCG/100ML IV SOLN
0.0000 ug/kg/h | INTRAVENOUS | Status: DC
  Administered 2018-03-12: 0.7 ug/kg/h via INTRAVENOUS
  Administered 2018-03-12: 0.4 ug/kg/h via INTRAVENOUS
  Filled 2018-03-12: qty 100

## 2018-03-12 MED ORDER — FAMOTIDINE IN NACL 20-0.9 MG/50ML-% IV SOLN
20.0000 mg | Freq: Two times a day (BID) | INTRAVENOUS | Status: AC
  Administered 2018-03-12 (×2): 20 mg via INTRAVENOUS
  Filled 2018-03-12: qty 50

## 2018-03-12 MED ORDER — MIDAZOLAM HCL 5 MG/5ML IJ SOLN
INTRAMUSCULAR | Status: DC | PRN
  Administered 2018-03-12: 3 mg via INTRAVENOUS
  Administered 2018-03-12 (×3): 2 mg via INTRAVENOUS
  Administered 2018-03-12: 1 mg via INTRAVENOUS

## 2018-03-12 MED ORDER — HEPARIN SODIUM (PORCINE) 1000 UNIT/ML IJ SOLN
INTRAMUSCULAR | Status: AC
  Filled 2018-03-12: qty 1

## 2018-03-12 MED ORDER — ACETAMINOPHEN 160 MG/5ML PO SOLN: 1000.0000 mg | Freq: Four times a day (QID) | ORAL | Status: DC

## 2018-03-12 MED ORDER — SODIUM CHLORIDE 0.9 % IV SOLN
INTRAVENOUS | Status: DC | PRN
  Administered 2018-03-12: 750 mg via INTRAVENOUS

## 2018-03-12 MED ORDER — PHENYLEPHRINE HCL-NACL 20-0.9 MG/250ML-% IV SOLN
0.0000 ug/min | INTRAVENOUS | Status: DC
  Administered 2018-03-12: 5 ug/min via INTRAVENOUS
  Filled 2018-03-12: qty 250

## 2018-03-12 MED ORDER — ROCURONIUM BROMIDE 10 MG/ML (PF) SYRINGE
PREFILLED_SYRINGE | INTRAVENOUS | Status: DC | PRN
  Administered 2018-03-12: 30 mg via INTRAVENOUS
  Administered 2018-03-12: 20 mg via INTRAVENOUS
  Administered 2018-03-12: 50 mg via INTRAVENOUS
  Administered 2018-03-12: 60 mg via INTRAVENOUS
  Administered 2018-03-12: 40 mg via INTRAVENOUS

## 2018-03-12 MED ORDER — LACTATED RINGERS IV SOLN
INTRAVENOUS | Status: DC | PRN
  Administered 2018-03-12: 07:00:00 via INTRAVENOUS

## 2018-03-12 MED ORDER — METOPROLOL TARTRATE 5 MG/5ML IV SOLN: 2.5000 mg | INTRAVENOUS | Status: DC | PRN

## 2018-03-12 MED ORDER — DOCUSATE SODIUM 100 MG PO CAPS
200.0000 mg | ORAL_CAPSULE | Freq: Every day | ORAL | Status: DC
  Administered 2018-03-13 – 2018-03-14 (×2): 200 mg via ORAL
  Filled 2018-03-12 (×2): qty 2

## 2018-03-12 MED ORDER — SODIUM CHLORIDE 0.9 % IV SOLN: 250.0000 mL | INTRAVENOUS | Status: DC

## 2018-03-12 MED ORDER — SODIUM CHLORIDE 0.45 % IV SOLN
INTRAVENOUS | Status: DC | PRN
  Administered 2018-03-12: 13:00:00 via INTRAVENOUS

## 2018-03-12 MED ORDER — SODIUM CHLORIDE 0.9% FLUSH: 10.0000 mL | INTRAVENOUS | Status: DC | PRN

## 2018-03-12 MED ORDER — VANCOMYCIN HCL IN DEXTROSE 1-5 GM/200ML-% IV SOLN
1000.0000 mg | Freq: Once | INTRAVENOUS | Status: AC
  Administered 2018-03-12: 1000 mg via INTRAVENOUS
  Filled 2018-03-12: qty 200

## 2018-03-12 MED ORDER — LACTATED RINGERS IV SOLN
INTRAVENOUS | Status: DC
  Administered 2018-03-12: 13:00:00 via INTRAVENOUS

## 2018-03-12 MED ORDER — LACTATED RINGERS IV SOLN
INTRAVENOUS | Status: DC | PRN
  Administered 2018-03-12: 06:00:00 via INTRAVENOUS

## 2018-03-12 MED ORDER — OXYCODONE HCL 5 MG PO TABS
5.0000 mg | ORAL_TABLET | ORAL | Status: DC | PRN
  Administered 2018-03-14: 5 mg via ORAL
  Filled 2018-03-12: qty 1

## 2018-03-12 MED ORDER — CEFUROXIME SODIUM 1.5 G IV SOLR
1.5000 g | Freq: Two times a day (BID) | INTRAVENOUS | Status: AC
  Administered 2018-03-12 – 2018-03-14 (×4): 1.5 g via INTRAVENOUS
  Filled 2018-03-12 (×4): qty 1.5

## 2018-03-12 MED ORDER — ONDANSETRON HCL 4 MG/2ML IJ SOLN: 4.0000 mg | Freq: Four times a day (QID) | INTRAMUSCULAR | Status: DC | PRN

## 2018-03-12 MED ORDER — PROTAMINE SULFATE 10 MG/ML IV SOLN
INTRAVENOUS | Status: AC
  Filled 2018-03-12: qty 25

## 2018-03-12 MED ORDER — METOPROLOL TARTRATE 25 MG/10 ML ORAL SUSPENSION: 12.5000 mg | Freq: Two times a day (BID) | ORAL | Status: DC

## 2018-03-12 MED ORDER — ROCURONIUM BROMIDE 100 MG/10ML IV SOLN: INTRAVENOUS | Status: DC | PRN

## 2018-03-12 MED ORDER — HEPARIN SODIUM (PORCINE) 1000 UNIT/ML IJ SOLN
INTRAMUSCULAR | Status: DC | PRN
  Administered 2018-03-12: 28000 [IU] via INTRAVENOUS

## 2018-03-12 MED ORDER — SODIUM CHLORIDE 0.9 % IV SOLN
INTRAVENOUS | Status: DC | PRN
  Administered 2018-03-12: 15 ug/min via INTRAVENOUS

## 2018-03-12 MED ORDER — PROPOFOL 10 MG/ML IV BOLUS
INTRAVENOUS | Status: AC
  Filled 2018-03-12: qty 20

## 2018-03-12 MED ORDER — HEMOSTATIC AGENTS (NO CHARGE) OPTIME
TOPICAL | Status: DC | PRN
  Administered 2018-03-12 (×3): 1 via TOPICAL

## 2018-03-12 MED ORDER — TRAMADOL HCL 50 MG PO TABS
50.0000 mg | ORAL_TABLET | ORAL | Status: DC | PRN
  Administered 2018-03-12: 50 mg via ORAL
  Administered 2018-03-13 – 2018-03-14 (×4): 100 mg via ORAL
  Filled 2018-03-12: qty 1
  Filled 2018-03-12 (×4): qty 2

## 2018-03-12 MED ORDER — 0.9 % SODIUM CHLORIDE (POUR BTL) OPTIME
TOPICAL | Status: DC | PRN
  Administered 2018-03-12: 1000 mL

## 2018-03-12 MED ORDER — SODIUM CHLORIDE 0.9 % IV SOLN
INTRAVENOUS | Status: DC
  Administered 2018-03-12: 3.1 [IU]/h via INTRAVENOUS
  Filled 2018-03-12: qty 1

## 2018-03-12 MED ORDER — ACETAMINOPHEN 500 MG PO TABS
1000.0000 mg | ORAL_TABLET | Freq: Four times a day (QID) | ORAL | Status: DC
  Administered 2018-03-12 – 2018-03-14 (×6): 1000 mg via ORAL
  Filled 2018-03-12 (×6): qty 2

## 2018-03-12 MED ORDER — SODIUM CHLORIDE 0.9 % IV SOLN
INTRAVENOUS | Status: DC | PRN
  Administered 2018-03-12: 12:00:00 via INTRAVENOUS

## 2018-03-12 MED ORDER — ACETAMINOPHEN 650 MG RE SUPP
650.0000 mg | Freq: Once | RECTAL | Status: AC
  Administered 2018-03-12: 650 mg via RECTAL

## 2018-03-12 MED ORDER — MIDAZOLAM HCL 10 MG/2ML IJ SOLN
INTRAMUSCULAR | Status: AC
  Filled 2018-03-12: qty 2

## 2018-03-12 MED ORDER — LACTATED RINGERS IV SOLN: INTRAVENOUS | Status: DC

## 2018-03-12 MED ORDER — MORPHINE SULFATE (PF) 2 MG/ML IV SOLN
1.0000 mg | INTRAVENOUS | Status: AC | PRN
  Administered 2018-03-12 (×4): 2 mg via INTRAVENOUS

## 2018-03-12 MED ORDER — ALBUMIN HUMAN 5 % IV SOLN
250.0000 mL | INTRAVENOUS | Status: AC | PRN
  Administered 2018-03-12 (×2): 250 mL via INTRAVENOUS

## 2018-03-12 MED ORDER — BISACODYL 5 MG PO TBEC
10.0000 mg | DELAYED_RELEASE_TABLET | Freq: Every day | ORAL | Status: DC
  Administered 2018-03-13 – 2018-03-14 (×2): 10 mg via ORAL
  Filled 2018-03-12 (×2): qty 2

## 2018-03-12 MED ORDER — BISACODYL 10 MG RE SUPP: 10.0000 mg | Freq: Every day | RECTAL | Status: DC

## 2018-03-12 MED ORDER — CHLORHEXIDINE GLUCONATE 0.12 % MT SOLN
15.0000 mL | OROMUCOSAL | Status: AC
  Administered 2018-03-12: 15 mL via OROMUCOSAL

## 2018-03-12 MED ORDER — ORAL CARE MOUTH RINSE
15.0000 mL | OROMUCOSAL | Status: DC
  Administered 2018-03-12: 15 mL via OROMUCOSAL

## 2018-03-12 MED ORDER — ACETAMINOPHEN 160 MG/5ML PO SOLN: 650.0000 mg | Freq: Once | ORAL | Status: AC

## 2018-03-12 MED ORDER — ASPIRIN EC 325 MG PO TBEC
325.0000 mg | DELAYED_RELEASE_TABLET | Freq: Every day | ORAL | Status: DC
  Administered 2018-03-13 – 2018-03-14 (×2): 325 mg via ORAL
  Filled 2018-03-12 (×2): qty 1

## 2018-03-12 MED ORDER — PANTOPRAZOLE SODIUM 40 MG PO TBEC
40.0000 mg | DELAYED_RELEASE_TABLET | Freq: Every day | ORAL | Status: DC
  Administered 2018-03-14: 40 mg via ORAL
  Filled 2018-03-12: qty 1

## 2018-03-12 MED ORDER — SODIUM CHLORIDE 0.9% FLUSH
10.0000 mL | Freq: Two times a day (BID) | INTRAVENOUS | Status: DC
  Administered 2018-03-13 – 2018-03-14 (×2): 10 mL

## 2018-03-12 MED ORDER — FENTANYL CITRATE (PF) 250 MCG/5ML IJ SOLN
INTRAMUSCULAR | Status: DC | PRN
  Administered 2018-03-12: 50 ug via INTRAVENOUS
  Administered 2018-03-12: 100 ug via INTRAVENOUS
  Administered 2018-03-12: 50 ug via INTRAVENOUS
  Administered 2018-03-12: 100 ug via INTRAVENOUS
  Administered 2018-03-12: 50 ug via INTRAVENOUS
  Administered 2018-03-12: 150 ug via INTRAVENOUS
  Administered 2018-03-12: 100 ug via INTRAVENOUS
  Administered 2018-03-12: 50 ug via INTRAVENOUS
  Administered 2018-03-12: 250 ug via INTRAVENOUS
  Administered 2018-03-12: 100 ug via INTRAVENOUS
  Administered 2018-03-12: 150 ug via INTRAVENOUS
  Administered 2018-03-12: 100 ug via INTRAVENOUS

## 2018-03-12 MED ORDER — MAGNESIUM SULFATE 4 GM/100ML IV SOLN
4.0000 g | Freq: Once | INTRAVENOUS | Status: AC
  Administered 2018-03-12: 4 g via INTRAVENOUS
  Filled 2018-03-12: qty 100

## 2018-03-12 MED ORDER — PHENYLEPHRINE 40 MCG/ML (10ML) SYRINGE FOR IV PUSH (FOR BLOOD PRESSURE SUPPORT)
PREFILLED_SYRINGE | INTRAVENOUS | Status: DC | PRN
  Administered 2018-03-12 (×4): 40 ug via INTRAVENOUS
  Administered 2018-03-12: 80 ug via INTRAVENOUS

## 2018-03-12 MED ORDER — CHLORHEXIDINE GLUCONATE 0.12% ORAL RINSE (MEDLINE KIT): 15.0000 mL | Freq: Two times a day (BID) | OROMUCOSAL | Status: DC

## 2018-03-12 MED ORDER — CHLORHEXIDINE GLUCONATE CLOTH 2 % EX PADS
6.0000 | MEDICATED_PAD | Freq: Every day | CUTANEOUS | Status: DC
  Administered 2018-03-12 – 2018-03-14 (×3): 6 via TOPICAL

## 2018-03-12 MED ORDER — PROPOFOL 10 MG/ML IV BOLUS
INTRAVENOUS | Status: DC | PRN
  Administered 2018-03-12: 120 mg via INTRAVENOUS
  Administered 2018-03-12: 20 mg via INTRAVENOUS
  Administered 2018-03-12: 10 mg via INTRAVENOUS

## 2018-03-12 MED ORDER — SODIUM CHLORIDE 0.9% FLUSH: 3.0000 mL | INTRAVENOUS | Status: DC | PRN

## 2018-03-12 MED ORDER — SODIUM CHLORIDE 0.9% FLUSH
3.0000 mL | Freq: Two times a day (BID) | INTRAVENOUS | Status: DC
  Administered 2018-03-13 – 2018-03-14 (×2): 3 mL via INTRAVENOUS

## 2018-03-12 MED ORDER — LACTATED RINGERS IV SOLN: 500.0000 mL | Freq: Once | INTRAVENOUS | Status: DC | PRN

## 2018-03-12 SURGICAL SUPPLY — 69 items
BAG DECANTER FOR FLEXI CONT (MISCELLANEOUS) ×4 IMPLANT
BANDAGE ACE 4X5 VEL STRL LF (GAUZE/BANDAGES/DRESSINGS) ×4 IMPLANT
BANDAGE ACE 6X5 VEL STRL LF (GAUZE/BANDAGES/DRESSINGS) ×4 IMPLANT
BLADE STERNUM SYSTEM 6 (BLADE) ×4 IMPLANT
BNDG GAUZE ELAST 4 BULKY (GAUZE/BANDAGES/DRESSINGS) ×4 IMPLANT
CANISTER SUCT 3000ML PPV (MISCELLANEOUS) ×4 IMPLANT
CATH CPB KIT GERHARDT (MISCELLANEOUS) ×4 IMPLANT
CATH THORACIC 28FR (CATHETERS) ×4 IMPLANT
CRADLE DONUT ADULT HEAD (MISCELLANEOUS) ×4 IMPLANT
DERMABOND ADVANCED (GAUZE/BANDAGES/DRESSINGS) ×1
DERMABOND ADVANCED .7 DNX12 (GAUZE/BANDAGES/DRESSINGS) ×3 IMPLANT
DRAIN CHANNEL 28F RND 3/8 FF (WOUND CARE) ×4 IMPLANT
DRAPE CARDIOVASCULAR INCISE (DRAPES) ×1
DRAPE SLUSH/WARMER DISC (DRAPES) ×4 IMPLANT
DRAPE SRG 135X102X78XABS (DRAPES) ×3 IMPLANT
DRSG AQUACEL AG ADV 3.5X14 (GAUZE/BANDAGES/DRESSINGS) ×4 IMPLANT
ELECT BLADE 4.0 EZ CLEAN MEGAD (MISCELLANEOUS) ×4
ELECT REM PT RETURN 9FT ADLT (ELECTROSURGICAL) ×8
ELECTRODE BLDE 4.0 EZ CLN MEGD (MISCELLANEOUS) ×3 IMPLANT
ELECTRODE REM PT RTRN 9FT ADLT (ELECTROSURGICAL) ×6 IMPLANT
FELT TEFLON 1X6 (MISCELLANEOUS) ×8 IMPLANT
GAUZE SPONGE 4X4 12PLY STRL (GAUZE/BANDAGES/DRESSINGS) ×8 IMPLANT
GAUZE SPONGE 4X4 12PLY STRL LF (GAUZE/BANDAGES/DRESSINGS) ×4 IMPLANT
GLOVE BIO SURGEON STRL SZ 6.5 (GLOVE) ×20 IMPLANT
GOWN STRL REUS W/ TWL LRG LVL3 (GOWN DISPOSABLE) ×12 IMPLANT
GOWN STRL REUS W/ TWL LRG LVL4 (GOWN DISPOSABLE) ×24 IMPLANT
GOWN STRL REUS W/TWL LRG LVL3 (GOWN DISPOSABLE) ×4
GOWN STRL REUS W/TWL LRG LVL4 (GOWN DISPOSABLE) ×8
HEMOSTAT POWDER SURGIFOAM 1G (HEMOSTASIS) ×12 IMPLANT
HEMOSTAT SURGICEL 2X14 (HEMOSTASIS) ×4 IMPLANT
KIT BASIN OR (CUSTOM PROCEDURE TRAY) ×4 IMPLANT
KIT CATH SUCT 8FR (CATHETERS) ×4 IMPLANT
KIT SUCTION CATH 14FR (SUCTIONS) ×8 IMPLANT
KIT TURNOVER KIT B (KITS) ×4 IMPLANT
KIT VASOVIEW HEMOPRO VH 3000 (KITS) ×4 IMPLANT
LEAD PACING MYOCARDI (MISCELLANEOUS) ×4 IMPLANT
MARKER GRAFT CORONARY BYPASS (MISCELLANEOUS) ×12 IMPLANT
NS IRRIG 1000ML POUR BTL (IV SOLUTION) ×20 IMPLANT
PACK E OPEN HEART (SUTURE) ×4 IMPLANT
PACK OPEN HEART (CUSTOM PROCEDURE TRAY) ×4 IMPLANT
PAD ARMBOARD 7.5X6 YLW CONV (MISCELLANEOUS) ×8 IMPLANT
PAD ELECT DEFIB RADIOL ZOLL (MISCELLANEOUS) ×4 IMPLANT
PENCIL BUTTON HOLSTER BLD 10FT (ELECTRODE) ×4 IMPLANT
PUNCH AORTIC ROTATE  4.5MM 8IN (MISCELLANEOUS) ×4 IMPLANT
SENSOR MYOCARDIAL TEMP (MISCELLANEOUS) ×4 IMPLANT
SET CARDIOPLEGIA MPS 5001102 (MISCELLANEOUS) ×4 IMPLANT
SPONGE LAP 18X18 X RAY DECT (DISPOSABLE) ×12 IMPLANT
SUT BONE WAX W31G (SUTURE) ×4 IMPLANT
SUT MNCRL AB 4-0 PS2 18 (SUTURE) ×4 IMPLANT
SUT PROLENE 3 0 SH1 36 (SUTURE) ×4 IMPLANT
SUT PROLENE 4 0 TF (SUTURE) ×8 IMPLANT
SUT PROLENE 6 0 CC (SUTURE) ×12 IMPLANT
SUT PROLENE 7 0 BV1 MDA (SUTURE) ×4 IMPLANT
SUT PROLENE 7.0 RB 3 (SUTURE) ×8 IMPLANT
SUT PROLENE 8 0 BV175 6 (SUTURE) ×20 IMPLANT
SUT STEEL 6MS V (SUTURE) ×4 IMPLANT
SUT STEEL SZ 6 DBL 3X14 BALL (SUTURE) ×4 IMPLANT
SUT VIC AB 1 CTX 18 (SUTURE) ×8 IMPLANT
SUT VIC AB 2-0 CT1 27 (SUTURE) ×1
SUT VIC AB 2-0 CT1 TAPERPNT 27 (SUTURE) ×3 IMPLANT
SYSTEM SAHARA CHEST DRAIN ATS (WOUND CARE) ×4 IMPLANT
TAPE CLOTH SURG 4X10 WHT LF (GAUZE/BANDAGES/DRESSINGS) ×4 IMPLANT
TAPE PAPER 2X10 WHT MICROPORE (GAUZE/BANDAGES/DRESSINGS) ×4 IMPLANT
TOWEL GREEN STERILE (TOWEL DISPOSABLE) ×4 IMPLANT
TOWEL GREEN STERILE FF (TOWEL DISPOSABLE) ×4 IMPLANT
TRAY FOLEY SLVR 16FR TEMP STAT (SET/KITS/TRAYS/PACK) ×4 IMPLANT
TUBING INSUFFLATION (TUBING) ×4 IMPLANT
UNDERPAD 30X30 (UNDERPADS AND DIAPERS) ×4 IMPLANT
WATER STERILE IRR 1000ML POUR (IV SOLUTION) ×8 IMPLANT

## 2018-03-12 NOTE — Progress Notes (Signed)
EVENING ROUNDS NOTE :     [address]       [name] [id number]             [phone]                 Day of Surgery Procedure(s) (LRB): CORONARY ARTERY BYPASS GRAFTING (CABG) x 4, LIMA to LAD, SVG SEQUENTIALLY to DISTAL CIRCUMFLEX and RAMUS INTERMEDIATED, and SVG to DISTAL RCA,  USING LEFT INTERNAL MAMMARY ARTERY AND RIGHT GREATER SAPHENOUS VEIN HARVESTED ENDOSCOPICALLY (N/A) TRANSESOPHAGEAL ECHOCARDIOGRAM (TEE) (N/A) MEDIAN STERNOTOMY  Total Length of Stay:  LOS: 3 days  BP 91/66   Pulse 71   Temp 99.1 F (37.3 C)   Resp 18   Ht 5' 10.6" (1.793 m)   Wt 92.5 kg (203 lb 14.8 oz)   SpO2 99%   BMI 28.77 kg/m   .Intake/Output      07/28 0701 - 07/29 0700 07/29 0701 - 07/30 0700   P.O. 200    I.V. (mL/kg) 434.3 (4.7) 2291.2 (24.8)   Blood  500   IV Piggyback  1435.9   Total Intake(mL/kg) 634.3 (6.9) 4227.2 (45.7)   Urine (mL/kg/hr) 2875 (1.3) 3120 (2.9)   Blood  655   Chest Tube  330   Total Output 2875 4105   Net -2240.7 +122.2        Urine Occurrence 1 x      . sodium chloride 20 mL/hr at 03/12/18 1800  . [START ON 03/13/2018] sodium chloride    . sodium chloride 10 mL/hr at 03/12/18 1414  . albumin human 250 mL (03/12/18 1419)  . cefUROXime (ZINACEF)  IV    . dexmedetomidine (PRECEDEX) IV infusion Stopped (03/12/18 1612)  . famotidine (PEPCID) IV 20 mg (03/12/18 1326)  . insulin (NOVOLIN-R) infusion 2.6 mL/hr at 03/12/18 1800  . lactated ringers    . lactated ringers    . lactated ringers 20 mL/hr at 03/12/18 1800  . nitroGLYCERIN Stopped (03/12/18 1326)  . phenylephrine (NEO-SYNEPHRINE) Adult infusion Stopped (03/12/18 1453)  . vancomycin       Lab Results  Component Value Date   WBC 9.3 03/12/2018   HGB 11.2 (L) 03/12/2018   HCT 33.0 (L) 03/12/2018   PLT 88 (L) 03/12/2018   GLUCOSE 137 (H) 03/12/2018   CHOL 154 03/09/2018   TRIG 318 (H) 03/09/2018   HDL 27 (L) 03/09/2018   LDLDIRECT 174.5 06/05/2013   LDLCALC 63 03/09/2018   ALT 23 03/11/2018   AST 21 03/11/2018   NA 141 03/12/2018   K 3.2 (L) 03/12/2018   CL 101 03/12/2018   CREATININE 0.70 03/12/2018   BUN 7 03/12/2018   CO2 27 03/12/2018   TSH 2.50 10/08/2010   PSA 0.26 02/28/2018   INR 1.46 03/12/2018   HGBA1C 8.0 (H) 03/11/2018   MICROALBUR 1.0 03/22/2010   Stable post op Extubated Not bleeding   Delight OvensEdward B Amarianna Abplanalp MD  [place] [phone] Office [phone] 03/12/2018 6:48 PM

## 2018-03-12 NOTE — Brief Op Note (Signed)
      [address]       [name] [id number]             [phone]    03/12/2018  6:47 PM  PATIENT:  Ronald Stephens  57 y.o. male  PRE-OPERATIVE DIAGNOSIS:  1. S/P NSTEMI 2.CAD  POST-OPERATIVE DIAGNOSIS:    1. S/P NSTEMI 2.CAD  PROCEDURE: TRANSESOPHAGEAL ECHOCARDIOGRAM (TEE),  MEDIAN STERNOTOMY for CORONARY ARTERY BYPASS GRAFTING (CABG)x 4 (LIMA to LAD, SVG SEQUENTIALLY to DISTAL CIRCUMFLEX and RAMUS INTERMEDIATED, and SVG to DISTAL RCA) using LEFT INTERNAL MAMMARY ARTERY HARVEST and EVH from RIGHT GREATER SAPHENOUS VEIN  SURGEON:  Surgeon(s) and Role:    Delight OvensGerhardt, Tajana Crotteau B, MD - Primary  PHYSICIAN ASSISTANT: Doree Fudgeonielle Zimmerman PA-C  ASSISTANTS: Algie CofferMaria Palance RNFA  ANESTHESIA:   general  EBL:  655 mL    DRAINS: Chest tubes placed in the mediastinal and pleural spaces   COUNTS CORRECT:  YES  DICTATION: .Dragon Dictation  PLAN OF CARE: Admit to inpatient   PATIENT DISPOSITION:  ICU - intubated and hemodynamically stable.   Delay start of Pharmacological VTE agent (>24hrs) due to surgical blood loss or risk of bleeding: yes  BASELINE WEIGHT: 93 kg

## 2018-03-12 NOTE — Progress Notes (Signed)
NIF -22 and VC 1.7

## 2018-03-12 NOTE — Procedures (Signed)
Extubation Procedure Note  Patient Details:   Name: Pat PatrickJose Esteves-Herrera DOB: 04/02/1961 MRN: [id number]   Airway Documentation:    Vent end date: 03/12/18 Vent end time: 1654   Evaluation  O2 sats: stable throughout Complications: No apparent complications Patient did tolerate procedure well. Bilateral Breath Sounds: Clear, Diminished   Yes, patient able to speak. No stridor. RN at bedside. Patient is now on 3L [place].  Rance MuirKayla N Hacker 03/12/2018, 5:02 PM

## 2018-03-12 NOTE — Anesthesia Procedure Notes (Signed)
Procedure Name: Intubation Date/Time: 03/12/2018 7:48 AM Performed by: Waynard EdwardsSmith, Ronald Span A, CRNA Pre-anesthesia Checklist: Patient identified, Emergency Drugs available, Suction available and Patient being monitored Patient Re-evaluated:Patient Re-evaluated prior to induction Oxygen Delivery Method: Circle system utilized Preoxygenation: Pre-oxygenation with 100% oxygen Induction Type: IV induction Ventilation: Mask ventilation without difficulty and Oral airway inserted - appropriate to patient size Laryngoscope Size: Hyacinth MeekerMiller and 2 Grade View: Grade II Tube type: Oral Tube size: 8.0 mm Number of attempts: 1 Airway Equipment and Method: Stylet Placement Confirmation: ETT inserted through vocal cords under direct vision,  positive ETCO2 and breath sounds checked- equal and bilateral Secured at: 24 (Teeth) cm Tube secured with: Tape Dental Injury: Teeth and Oropharynx as per pre-operative assessment

## 2018-03-12 NOTE — Transfer of Care (Signed)
Immediate Anesthesia Transfer of Care Note  Patient: Ronald Stephens  Procedure(s) Performed: CORONARY ARTERY BYPASS GRAFTING (CABG) x 4, LIMA to LAD, SVG SEQUENTIALLY to DISTAL CIRCUMFLEX and RAMUS INTERMEDIATED, and SVG to DISTAL RCA,  USING LEFT INTERNAL MAMMARY ARTERY AND RIGHT GREATER SAPHENOUS VEIN HARVESTED ENDOSCOPICALLY (N/A Chest) TRANSESOPHAGEAL ECHOCARDIOGRAM (TEE) (N/A ) MEDIAN STERNOTOMY  Patient Location: ICU  Anesthesia Type:General  Level of Consciousness: Patient remains intubated per anesthesia plan  Airway & Oxygen Therapy: Patient remains intubated per anesthesia plan and Patient placed on Ventilator (see vital sign flow sheet for setting)  Post-op Assessment: Report given to RN and Post -op Vital signs reviewed and stable  Post vital signs: Reviewed and stable  Last Vitals:  Vitals Value Taken Time  BP 100/80 03/12/2018  1:33 PM  Temp 36.1 C 03/12/2018  1:37 PM  Pulse 89 03/12/2018  1:37 PM  Resp 14 03/12/2018  1:37 PM  SpO2 100 % 03/12/2018  1:37 PM  Vitals shown include unvalidated device data.  Last Pain:  Vitals:   03/12/18 0400  TempSrc:   PainSc: 0-No pain      Patients Stated Pain Goal: 0 (03/09/18 2126)  Complications: No apparent anesthesia complications

## 2018-03-12 NOTE — Progress Notes (Signed)
      [address]       [place] [id number]             [phone]     Pre Procedure note for inpatients:   Ronald PrairieJose Stephens has been scheduled for Procedure(s): CORONARY ARTERY BYPASS GRAFTING (CABG) (N/A) TRANSESOPHAGEAL ECHOCARDIOGRAM (TEE) (N/A) today. The various methods of treatment have been discussed with the patient. After consideration of the risks, benefits and treatment options the patient has consented to the planned procedure.   The patient has been seen and labs reviewed. There are no changes in the patient's condition to prevent proceeding with the planned procedure today.  Recent labs:  Lab Results  Component Value Date   WBC 7.4 03/12/2018   HGB 14.9 03/12/2018   HCT 45.0 03/12/2018   PLT 127 (L) 03/12/2018   GLUCOSE 170 (H) 03/12/2018   CHOL 154 03/09/2018   TRIG 318 (H) 03/09/2018   HDL 27 (L) 03/09/2018   LDLDIRECT 174.5 06/05/2013   LDLCALC 63 03/09/2018   ALT 23 03/11/2018   AST 21 03/11/2018   NA 138 03/12/2018   K 3.6 03/12/2018   CL 99 03/12/2018   CREATININE 0.98 03/12/2018   BUN 9 03/12/2018   CO2 27 03/12/2018   TSH 2.50 10/08/2010   PSA 0.26 02/28/2018   INR 1.12 03/11/2018   HGBA1C 8.0 (H) 03/11/2018   MICROALBUR 1.0 03/22/2010   PFT's completed :  FEV1 .98 24 %  Interpretation: The FVC, FEV1, FEV1/FVC ratio and FEF25-75% are reduced indicating airway obstruction. The FVC is reduced relative to the SVC indicating air trapping. The slow vital capacity is reduced. Following administration of bronchodilators, there is marginal response.  Conclusions:  Pulmonary Function Diagnosis: Severe Obstructive Airways Disease Minimal ressponse to bronchodilator    Delight OvensEdward B Laird Runnion, MD 03/12/2018 7:18 AM

## 2018-03-12 NOTE — Anesthesia Procedure Notes (Signed)
Central Venous Catheter Insertion Performed by: anesthesiologist Start/End7/29/2019 6:36 AM, 03/12/2018 6:46 AM Patient location: Pre-op. Preanesthetic checklist: patient identified, IV checked, site marked, risks and benefits discussed, surgical consent, monitors and equipment checked, pre-op evaluation, timeout performed and anesthesia consent Hand hygiene performed  and maximum sterile barriers used  PA cath was placed.Swan type:thermodilution Procedure performed without using ultrasound guided technique. Attempts: 1 Following insertion, line sutured, dressing applied and Biopatch. Post procedure assessment: blood return through all ports  Patient tolerated the procedure well with no immediate complications.

## 2018-03-12 NOTE — Progress Notes (Signed)
Post dangle

## 2018-03-12 NOTE — Progress Notes (Signed)
  Echocardiogram Echocardiogram Transesophageal has been performed.  Leta JunglingCooper, Royann Wildasin M 03/12/2018, 8:24 AM

## 2018-03-12 NOTE — Anesthesia Procedure Notes (Signed)
Arterial Line Insertion Start/End7/29/2019 6:45 AM, 03/12/2018 6:50 AM Performed by: Waynard EdwardsSmith, Shaunette Gassner A, CRNA, CRNA  Patient location: Pre-op. Preanesthetic checklist: patient identified, IV checked, site marked, risks and benefits discussed, surgical consent, monitors and equipment checked, pre-op evaluation, timeout performed and anesthesia consent Lidocaine 1% used for infiltration and patient sedated Left, radial was placed Catheter size: 20 G Hand hygiene performed , maximum sterile barriers used  and Seldinger technique used Allen's test indicative of satisfactory collateral circulation Attempts: 1 Procedure performed without using ultrasound guided technique. Following insertion, dressing applied and Biopatch. Post procedure assessment: normal and unchanged  Patient tolerated the procedure well with no immediate complications.

## 2018-03-12 NOTE — Anesthesia Procedure Notes (Signed)
Central Venous Catheter Insertion Performed by: Kipp BroodJoslin, Obed Samek, MD, anesthesiologist Start/End7/29/2019 6:37 AM, 03/12/2018 6:47 AM Patient location: Pre-op. Preanesthetic checklist: patient identified, IV checked, site marked, risks and benefits discussed, surgical consent, monitors and equipment checked, pre-op evaluation, timeout performed and anesthesia consent Lidocaine 1% used for infiltration and patient sedated Hand hygiene performed  and maximum sterile barriers used  Catheter size: 9 Fr Sheath introducer Procedure performed using ultrasound guided technique. Ultrasound Notes:anatomy identified, needle tip was noted to be adjacent to the nerve/plexus identified, no ultrasound evidence of intravascular and/or intraneural injection and image(s) printed for medical record Attempts: 1 Following insertion, line sutured and dressing applied. Post procedure assessment: blood return through all ports, free fluid flow and no air  Patient tolerated the procedure well with no immediate complications.

## 2018-03-13 ENCOUNTER — Inpatient Hospital Stay (HOSPITAL_COMMUNITY): Payer: 59

## 2018-03-13 ENCOUNTER — Encounter (HOSPITAL_COMMUNITY): Payer: Self-pay | Admitting: Cardiothoracic Surgery

## 2018-03-13 DIAGNOSIS — I25110 Atherosclerotic heart disease of native coronary artery with unstable angina pectoris: Secondary | ICD-10-CM

## 2018-03-13 LAB — CBC
HEMATOCRIT: 35.9 % — AB (ref 39.0–52.0)
HEMATOCRIT: 38.3 % — AB (ref 39.0–52.0)
HEMOGLOBIN: 11.7 g/dL — AB (ref 13.0–17.0)
HEMOGLOBIN: 12.3 g/dL — AB (ref 13.0–17.0)
MCH: 27.6 pg (ref 26.0–34.0)
MCH: 27.9 pg (ref 26.0–34.0)
MCHC: 32.6 g/dL (ref 30.0–36.0)
MCHC: 32.1 g/dL (ref 30.0–36.0)
MCV: 84.7 fL (ref 78.0–100.0)
MCV: 86.8 fL (ref 78.0–100.0)
Platelets: 114 10*3/uL — ABNORMAL LOW (ref 150–400)
Platelets: 111 10*3/uL — ABNORMAL LOW (ref 150–400)
RBC: 4.24 MIL/uL (ref 4.22–5.81)
RBC: 4.41 MIL/uL (ref 4.22–5.81)
RDW: 13.2 % (ref 11.5–15.5)
RDW: 13.6 % (ref 11.5–15.5)
WBC: 10.3 10*3/uL (ref 4.0–10.5)
WBC: 11.2 10*3/uL — AB (ref 4.0–10.5)

## 2018-03-13 LAB — GLUCOSE, CAPILLARY
GLUCOSE-CAPILLARY: 105 mg/dL — AB (ref 70–99)
GLUCOSE-CAPILLARY: 109 mg/dL — AB (ref 70–99)
GLUCOSE-CAPILLARY: 116 mg/dL — AB (ref 70–99)
GLUCOSE-CAPILLARY: 124 mg/dL — AB (ref 70–99)
GLUCOSE-CAPILLARY: 175 mg/dL — AB (ref 70–99)
Glucose-Capillary: 139 mg/dL — ABNORMAL HIGH (ref 70–99)
Glucose-Capillary: 107 mg/dL — ABNORMAL HIGH (ref 70–99)
Glucose-Capillary: 130 mg/dL — ABNORMAL HIGH (ref 70–99)
Glucose-Capillary: 132 mg/dL — ABNORMAL HIGH (ref 70–99)

## 2018-03-13 LAB — BASIC METABOLIC PANEL
ANION GAP: 6 (ref 5–15)
BUN: 8 mg/dL (ref 6–20)
CHLORIDE: 108 mmol/L (ref 98–111)
CO2: 23 mmol/L (ref 22–32)
CREATININE: 0.75 mg/dL (ref 0.61–1.24)
Calcium: 7.8 mg/dL — ABNORMAL LOW (ref 8.9–10.3)
GFR calc non Af Amer: >60 mL/min (ref >60)
Glucose, Bld: 106 mg/dL — ABNORMAL HIGH (ref 70–99)
POTASSIUM: 3.7 mmol/L (ref 3.5–5.1)
Sodium: 137 mmol/L (ref 135–145)

## 2018-03-13 LAB — MAGNESIUM
MAGNESIUM: 2.6 mg/dL — AB (ref 1.7–2.4)
MAGNESIUM: 2.4 mg/dL (ref 1.7–2.4)

## 2018-03-13 LAB — CREATININE, SERUM: Creatinine, Ser: 0.85 mg/dL (ref 0.61–1.24)

## 2018-03-13 LAB — HEMOGLOBIN AND HEMATOCRIT, BLOOD
HCT: 32.5 % — ABNORMAL LOW (ref 39.0–52.0)
Hemoglobin: 10.9 g/dL — ABNORMAL LOW (ref 13.0–17.0)

## 2018-03-13 MED ORDER — INSULIN DETEMIR 100 UNIT/ML ~~LOC~~ SOLN
10.0000 [IU] | Freq: Every day | SUBCUTANEOUS | Status: DC
  Administered 2018-03-14 – 2018-03-17 (×4): 10 [IU] via SUBCUTANEOUS
  Filled 2018-03-13 (×4): qty 0.1

## 2018-03-13 MED ORDER — LEVALBUTEROL HCL 0.63 MG/3ML IN NEBU: 0.6300 mg | INHALATION_SOLUTION | Freq: Four times a day (QID) | RESPIRATORY_TRACT | Status: DC | PRN

## 2018-03-13 MED ORDER — POTASSIUM CHLORIDE 10 MEQ/50ML IV SOLN
10.0000 meq | INTRAVENOUS | Status: AC
  Administered 2018-03-13 (×3): 10 meq via INTRAVENOUS
  Filled 2018-03-13 (×4): qty 50

## 2018-03-13 MED ORDER — FUROSEMIDE 10 MG/ML IJ SOLN
20.0000 mg | Freq: Two times a day (BID) | INTRAMUSCULAR | Status: AC
  Administered 2018-03-13 (×2): 20 mg via INTRAVENOUS
  Filled 2018-03-13 (×2): qty 2

## 2018-03-13 MED ORDER — INSULIN ASPART 100 UNIT/ML ~~LOC~~ SOLN: 0.0000 [IU] | SUBCUTANEOUS | Status: DC

## 2018-03-13 MED ORDER — POTASSIUM CHLORIDE CRYS ER 20 MEQ PO TBCR
20.0000 meq | EXTENDED_RELEASE_TABLET | Freq: Once | ORAL | Status: AC
  Administered 2018-03-13: 20 meq via ORAL
  Filled 2018-03-13: qty 1

## 2018-03-13 MED ORDER — POTASSIUM CHLORIDE CRYS ER 20 MEQ PO TBCR
40.0000 meq | EXTENDED_RELEASE_TABLET | Freq: Two times a day (BID) | ORAL | Status: DC
  Administered 2018-03-13 – 2018-03-14 (×2): 40 meq via ORAL
  Filled 2018-03-13 (×2): qty 2

## 2018-03-13 MED ORDER — INSULIN ASPART 100 UNIT/ML ~~LOC~~ SOLN
0.0000 [IU] | SUBCUTANEOUS | Status: DC
  Administered 2018-03-13: 8 [IU] via SUBCUTANEOUS
  Administered 2018-03-14: 4 [IU] via SUBCUTANEOUS
  Administered 2018-03-14 (×2): 2 [IU] via SUBCUTANEOUS

## 2018-03-13 MED ORDER — INSULIN DETEMIR 100 UNIT/ML ~~LOC~~ SOLN
10.0000 [IU] | Freq: Once | SUBCUTANEOUS | Status: AC
  Administered 2018-03-13: 10 [IU] via SUBCUTANEOUS
  Filled 2018-03-13: qty 0.1

## 2018-03-13 NOTE — Progress Notes (Signed)
Patient ID: Ronald Stephens, male   DOB: 09/13/1960, 57 y.o.   MRN: [id number] [facility] Evening Rounds:  Hemodynamically stable in sinus rhythm.  Diuresing well  Ambulated around the ICU this pm.

## 2018-03-13 NOTE — Anesthesia Postprocedure Evaluation (Signed)
Anesthesia Post Note  Patient: Ronald Stephens  Procedure(s) Performed: CORONARY ARTERY BYPASS GRAFTING (CABG) x 4, LIMA to LAD, SVG SEQUENTIALLY to DISTAL CIRCUMFLEX and RAMUS INTERMEDIATED, and SVG to DISTAL RCA,  USING LEFT INTERNAL MAMMARY ARTERY AND RIGHT GREATER SAPHENOUS VEIN HARVESTED ENDOSCOPICALLY (N/A Chest) TRANSESOPHAGEAL ECHOCARDIOGRAM (TEE) (N/A ) MEDIAN STERNOTOMY     Patient location during evaluation: SICU Anesthesia Type: General Level of consciousness: awake and alert Pain management: satisfactory to patient Vital Signs Assessment: post-procedure vital signs reviewed and stable Respiratory status: spontaneous breathing Cardiovascular status: stable Postop Assessment: no apparent nausea or vomiting Anesthetic complications: no Comments: No vasopressor support. CO > 5, CI >2.5.   No complaints.     Last Vitals:  Vitals:   03/13/18 1300 03/13/18 1332  BP: 120/69   Pulse: 71   Resp: (!) 21   Temp:    SpO2: 96% 96%    Last Pain:  Vitals:   03/13/18 1200  TempSrc: Oral  PainSc:                  Shelton SilvasKevin D Dago Jungwirth

## 2018-03-13 NOTE — Op Note (Signed)
NAME: Ronald Stephens, Ronald Stephens MEDICAL RECORD [id number] ACCOUNT [id number] DATE OF BIRTH:05-Jun-1961 FACILITY: [facility] LOCATION: [facility] PHYSICIAN:Kienan Doublin Bari Aaron Bostwick, MD  OPERATIVE REPORT  DATE OF PROCEDURE:  03/13/2018  PREOPERATIVE DIAGNOSIS:  Recent non-STEMI myocardial infarction and 3-vessel coronary artery disease.  POSTOPERATIVE DIAGNOSIS:  Recent non-STEMI myocardial infarction and 3-vessel coronary artery disease.  SURGICAL PROCEDURE:  Coronary artery bypass grafting x4 with the left internal mammary to the left anterior descending coronary artery, sequential reverse saphenous vein graft to the intermediate and distal circumflex, reverse saphenous vein graft to the  distal right coronary artery with right greater saphenous thigh endovein harvesting.  SURGEON:  Gwenith Daily. Tyrone Sage, MD  FIRST ASSISTANT:  Lin Landsman, PA.  BRIEF HISTORY:  The patient is a 57 year old male with previous smoking history, but no previous cardiac history who presented with increasing shortness of breath and chest discomfort.  Troponins were elevated.  The patient was admitted initially treated  medically.  Dr. Katrinka Blazing proceeded with cardiac catheterization, which demonstrated significant 3-vessel coronary artery disease including 90% proximal LAD lesion, total occlusion of the distal circumflex after the takeoff of the intermediate coronary  artery, 70% stenosis of the intermediate total occlusion of the right coronary artery with collateral filling of the distal vessel.  Overall, ventricular function was preserved.  Because of the patient's significant 3-vessel coronary artery disease and  symptoms, coronary artery bypass grafting was recommended to the patient who agreed and signed informed consent.  DESCRIPTION OF PROCEDURE:  With Swan-Ganz and arterial line monitors in place, the patient underwent general endotracheal anesthesia without incident.  Skin of the chest and legs was prepped with Betadine  and draped in the usual sterile manner.   Appropriate timeout was performed.  We then proceeded with endoscopic vein harvesting of the right greater saphenous vein from the thigh.  The vein was of excellent quality and caliber.  Median sternotomy was performed.  Left internal mammary artery was  dissected down as a pedicle graft.  The distal artery was divided.  Good free flow.  Pericardium was opened.  Overall, ventricular function appeared preserved.  The patient was systemically heparinized.  The ascending aorta was cannulated.  The right  atrium was cannulated.  An aortic root vent cardioplegia needle was introduced into the ascending aorta.  The patient was placed on cardiopulmonary bypass 2.4 liters per minute per meter square.  Sites and anastomoses were selected and dissected out of  the epicardium.  The patient's body temperature was cooled to 32 degrees.  Aortic crossclamp was applied and 500 mL cold blood potassium cardioplegia was administered with diastolic arrest of the heart.  Myocardial septal temperature was monitored  throughout the crossclamp.  Attention was turned first to the distal right coronary artery.  The posterolateral and posterior descending branches were relatively small.  The distal right coronary artery was opened and admitted a 1.5 mm probe.  Using a  running 7-0 Prolene a distal anastomosis was performed.  Attention was then turned to the intermediate coronary artery, which was partially intramyocardial.  This vessel was opened and admitted a 1.5 mm probe distally.  Using a diamond type side-to-side  anastomosis with a running 8-0 Prolene, a segment of reverse saphenous vein graft was anastomosed to the intermediate.  The distal extent of the same vein was then carried to the totally occluded distal circumflex branch.  This vessel was opened and was  1.3-1.4 mm in size.  Using a running 8-0 Prolene, distal anastomosis was performed.  Additional cold blood cardioplegia  was  administered down the vein grafts.  Attention was then turned to the left anterior descending coronary artery and the midportion  of the vessel was opened and admitted a 1.5 mm probe distally.  Using a running 8-0 Prolene, the left internal mammary artery was anastomosed to the left anterior descending coronary artery.  With cross clamp still in place, 2 punch aortotomies were  performed and each of the 2 vein grafts were anastomosed to the ascending aorta.  The bulldog was removed from the mammary artery with prompt rise in myocardial septal temperature.  The heart was allowed to passively fill and deair and the proximal  anastomoses were completed.  Aortic cross clamp was removed with total crossclamp time of 101 minutes.  The patient spontaneously converted to sinus rhythm.  Atrial and ventricular pacing wires were placed and he was atrially paced to increase rate.   Sites and anastomoses were inspected and were free of bleeding.  He was then ventilated and weaned from cardiopulmonary bypass without difficulty.  He remained hemodynamically stable, was decannulated in the usual fashion.  Protamine sulfate was  administered with operative field hemostatic.  A left pleural tube and a Blake mediastinal drain were left in place.  Pericardium was loosely reapproximated.  Sternum was closed with #6 stainless steel wire.  Fascia closed with interrupted 0 Vicryl,  running 3-0 Vicryl for subcutaneous tissue.  Dry dressings were applied.  Sponge and needle count was reported as correct at completion of the procedure.    The patient tolerated the procedure without obvious complication and was transferred to the Surgical Intensive Care Unit for further postoperative care.  He did not require any blood bank blood products during the operative procedure.    The PA scrubbed in on the case and the first assistant assisted with vein harvesting, cannulation, decannulation and construction of the anastomosis and wound  closure.  AN/NUANCE  D:03/13/2018 T:03/13/2018 JOB:[id number]

## 2018-03-13 NOTE — Progress Notes (Addendum)
[facility] DAILY ICU PROGRESS NOTE                   [address]            [name] [id number]          [phone]   1 Day Post-Op Procedure(s) (LRB): CORONARY ARTERY BYPASS GRAFTING (CABG) x 4, LIMA to LAD, SVG SEQUENTIALLY to DISTAL CIRCUMFLEX and RAMUS INTERMEDIATED, and SVG to DISTAL RCA,  USING LEFT INTERNAL MAMMARY ARTERY AND RIGHT GREATER SAPHENOUS VEIN HARVESTED ENDOSCOPICALLY (N/A) TRANSESOPHAGEAL ECHOCARDIOGRAM (TEE) (N/A) MEDIAN STERNOTOMY  Total Length of Stay:  LOS: 4 days   Subjective: Patient with incisional pain this am.  Objective: Vital signs in last 24 hours: Temp:  [96.6 F (35.9 C)-99.3 F (37.4 C)] 99 F (37.2 C) (07/30 0500) Pulse Rate:  [63-90] 75 (07/30 0500) Cardiac Rhythm: Normal sinus rhythm (07/30 0400) Resp:  [12-30] 17 (07/30 0500) BP: (89-129)/(58-86) 104/66 (07/30 0400) SpO2:  [95 %-100 %] 96 % (07/30 0734) Arterial Line BP: (101-151)/(49-81) 133/60 (07/30 0600) FiO2 (%):  [40 %-100 %] 40 % (07/29 1554) Weight:  [203 lb 14.8 oz (92.5 kg)-213 lb 14.4 oz (97 kg)] 213 lb 14.4 oz (97 kg) (07/30 0500)  Filed Weights   03/09/18 0334 03/12/18 1332 03/13/18 0500  Weight: 203 lb 14.8 oz (92.5 kg) 203 lb 14.8 oz (92.5 kg) 213 lb 14.4 oz (97 kg)    Weight change:    Hemodynamic parameters for last 24 hours: PAP: (23-48)/(5-28) 31/15 CO:  [2.4 L/min-6.5 L/min] 2.7 L/min CI:  [2.2 L/min/m2-5.6 L/min/m2] 5.6 L/min/m2  Intake/Output from previous day: 07/29 0701 - 07/30 0700 In: 6391.8 [P.O.:290; I.V.:2936.4; Blood:500; IV Piggyback:2665.4] Out: 5215 [Urine:3890; Blood:655; Chest Tube:670]  Intake/Output this shift: No intake/output data recorded.  Current Meds: Scheduled Meds: . acetaminophen  1,000 mg Oral Q6H   Or  . acetaminophen (TYLENOL) oral liquid 160 mg/5 mL  1,000 mg Per Tube Q6H  . aspirin EC  325 mg Oral Daily   Or  . aspirin  324 mg Per Tube Daily  . atorvastatin  80 mg Oral Daily  . bisacodyl  10 mg Oral Daily     Or  . bisacodyl  10 mg Rectal Daily  . Chlorhexidine Gluconate Cloth  6 each Topical Daily  . docusate sodium  200 mg Oral Daily  . insulin regular  0-10 Units Intravenous TID WC  . levalbuterol  0.63 mg Nebulization TID  . mouth rinse  15 mL Mouth Rinse BID  . metoprolol tartrate  12.5 mg Oral BID   Or  . metoprolol tartrate  12.5 mg Per Tube BID  . [START ON 03/14/2018] pantoprazole  40 mg Oral Daily  . sodium chloride flush  10-40 mL Intracatheter Q12H  . sodium chloride flush  3 mL Intravenous Q12H  . thiamine  100 mg Oral Daily   Continuous Infusions: . sodium chloride Stopped (03/13/18 0553)  . sodium chloride    . sodium chloride 10 mL/hr at 03/12/18 1414  . albumin human 250 mL (03/12/18 1419)  . cefUROXime (ZINACEF)  IV Stopped (03/12/18 2133)  . dexmedetomidine (PRECEDEX) IV infusion Stopped (03/12/18 1612)  . insulin (NOVOLIN-R) infusion 1.9 mL/hr at 03/13/18 0600  . lactated ringers    . lactated ringers    . lactated ringers 20 mL/hr at 03/13/18 0600  . nitroGLYCERIN Stopped (03/12/18 1326)  . phenylephrine (NEO-SYNEPHRINE) Adult infusion Stopped (03/13/18 0553)  . potassium chloride 50 mL/hr at 03/13/18  0600   PRN Meds:.sodium chloride, albumin human, lactated ringers, metoprolol tartrate, midazolam, morphine injection, ondansetron (ZOFRAN) IV, oxyCODONE, sodium chloride flush, sodium chloride flush, traMADol  General appearance: alert, cooperative and no distress Neurologic: intact Heart: RRR, rub with chest tubes in place Lungs: Diminished at bases Abdomen: Soft, non tender, bowel sounds present Extremities: Bild bilateral LE edema Wound: Aquacel intact;Lower RLE dressing with bloody ooze  Lab Results: CBC: Recent Labs    03/12/18 1900 03/13/18 0339  WBC 8.7 10.3  HGB 11.5* 11.7*  HCT 34.9* 35.9*  PLT 94* 114*   BMET:  Recent Labs    03/12/18 0524  03/12/18 1858 03/12/18 1900 03/13/18 0339  NA 138   < > 140  --  137  K 3.6   < > 4.0  --   3.7  CL 99   < > 103  --  108  CO2 27  --   --   --  23  GLUCOSE 170*   < > 124*  --  106*  BUN 9   < > 8  --  8  CREATININE 0.98   < > 0.70 0.83 0.75  CALCIUM 8.9  --   --   --  7.8*   < > = values in this interval not displayed.    CMET: Lab Results  Component Value Date   WBC 10.3 03/13/2018   HGB 11.7 (L) 03/13/2018   HCT 35.9 (L) 03/13/2018   PLT 114 (L) 03/13/2018   GLUCOSE 106 (H) 03/13/2018   CHOL 154 03/09/2018   TRIG 318 (H) 03/09/2018   HDL 27 (L) 03/09/2018   LDLDIRECT 174.5 06/05/2013   LDLCALC 63 03/09/2018   ALT 23 03/11/2018   AST 21 03/11/2018   NA 137 03/13/2018   K 3.7 03/13/2018   CL 108 03/13/2018   CREATININE 0.75 03/13/2018   BUN 8 03/13/2018   CO2 23 03/13/2018   TSH 2.50 10/08/2010   PSA 0.26 02/28/2018   INR 1.46 03/12/2018   HGBA1C 8.0 (H) 03/11/2018   MICROALBUR 1.0 03/22/2010      PT/INR:  Recent Labs    03/12/18 1336  LABPROT 17.6*  INR 1.46   Radiology: Dg Chest Port 1 View  Result Date: 03/12/2018 CLINICAL DATA:  Status post CABG x4. EXAM: PORTABLE CHEST 1 VIEW COMPARISON:  03/08/2018 FINDINGS: Endotracheal tube is in place, tip 4.4 centimeters above the carina. Nasogastric tube is in place with tip overlying the level of the stomach. A a RIGHT IJ Swan-Ganz catheter tip overlies the level of the pulmonary outflow tract. A LEFT-sided chest tube has been placed. The heart size is accentuated by the portable technique. There is bibasilar atelectasis versus consolidation, LEFT greater than RIGHT. No pneumothorax. IMPRESSION: 1. Postoperative changes. 2. Bibasilar atelectasis and/or consolidation. 3. No pneumothorax. Electronically Signed   By: Norva Pavlov M.D.   On: 03/12/2018 13:50     Assessment/Plan: S/P Procedure(s) (LRB): CORONARY ARTERY BYPASS GRAFTING (CABG) x 4, LIMA to LAD, SVG SEQUENTIALLY to DISTAL CIRCUMFLEX and RAMUS INTERMEDIATED, and SVG to DISTAL RCA,  USING LEFT INTERNAL MAMMARY ARTERY AND RIGHT GREATER SAPHENOUS  VEIN HARVESTED ENDOSCOPICALLY (N/A) TRANSESOPHAGEAL ECHOCARDIOGRAM (TEE) (N/A) MEDIAN STERNOTOMY  1. CV-CO/CI 2.7/5.6. On Lopressor 12.5 mg bid. 2. Pulmonary-On 3 liters of oxygen via [place]. Chest tube output with 670 cc since surgery. Consider removing mediastinal tube only if output decreased this afternoon. Will wean to room air as tolerates. CXR this am appears to show bibasilar atelectasis and pleural  effusions. Encourage incentive spirometer and flutter valve.  3. Volume Overload-Will give Lasix 20 mg IV bid 4. ABL anemia-H and H 11.7 and 35.9. 5.DM-CBGs 116/109/107. Wean off Insulin drip. Will restart low dose Metformin once tolerating  6. Thrombocytopenia-platelets 114,000 7. Supplement potassium 8. Please see progression orders   Donielle Margaretann Loveless 03/13/2018 7:59 AMpatient stable, cooperative with care  Expected Acute  Blood - loss Anemia- continue to monitor  I have seen and examined Pat Ronald Stephens and agree with the above assessment  and plan.  Delight Ovens MD Beeper [phone] Office [phone] 03/13/2018 2:06 PM

## 2018-03-13 NOTE — Discharge Summary (Signed)
Physician Discharge Summary       [address]       [name] [id number]             [phone]    Patient ID: Ronald Stephens MRN: [id number] DOB/AGE: 58/10/1960 58 y.o.  Admit date: 03/08/2018 Discharge date: 03/17/2018  Admission Diagnoses: 1. NSTEMI (non-ST elevated myocardial infarction) (HCC) 2. Coronary artery disease  Discharge Diagnoses:  1.   S/P CABG x 4 2. ABL anemia 3. Mild thrombocytopenia 4. History of controlled type 2 diabetes mellitus without complication, without long-term current use of insulin (HCC) 5. History hyperlipidemia 6. History of type 2 diabetes mellitus (HCC) 7. History of essential hypertension 8. History of COPD, severe (HCC) 8. History of tobacco abuse 9. History of alcohol use   Procedure (s):  Lyn RecordsSmith, Henry W, MD (Primary) on 03/09/2018:    Procedures   LEFT HEART CATH AND CORONARY ANGIOGRAPHY  Conclusion    Severe three-vessel coronary artery disease.  Moderate three-vessel coronary calcification.  Greater than 95% proximal LAD and 70% mid to distal.  First diagonal with multifocal 90% stenosis.  The LAD wraps around the left ventricular apex.  Ramus intermedius with proximal and mid 90% stenoses.  Circumflex is totally occluded after a large first obtuse marginal.  The obtuse marginal contains 70% small to mid stenosis.  Left to left collaterals to the occluded segment.  Dominant right coronary totally occluded proximal to mid segment with right to right and left to right collaterals.  Mild inferior hypokinesis.  EF 50 to 55%.  Normal LVEDP  RECOMMENDATIONS:   TCTS consultation to consider multivessel revascularization.  Resume IV heparin  Aggressive secondary risk factor modification: High intensity statin therapy, glycemic control, start low-dose beta-blocker therapy, aspirin, and should be hospitalized until revascularization can occur.    Coronary artery bypass grafting x4 with the left internal  mammary to the left anterior descending coronary artery, sequential reverse saphenous vein graft to the intermediate and distal circumflex, reverse saphenous vein graft to the  distal right coronary artery with right greater saphenous thigh endovein harvesting by Dr. Tyrone SageGerhardt on 03/12/2018.  History of Presenting Illness: This is a 57 year old male with a past medical history of hypertension, hyperlipidemia, diabetes mellitus, tobacco abuse who states that he has had upper chest pain radiating to both arms that has occurred with both exertion and at rest. He states this has been going on "for awhile" but has worsened over the last month. Also, there are days when he has multiple episodes but days when he has no episodes. He denies shortness of breath, LE edema, nausea, diaphoresis, pre syncope, or syncope.  He states he presented to his general practitioner, Dr. Patsy Lageropland on 02/28/2018 for further evaluation and treatment of aforementioned. EKG and labs were obtained as well as referral to cardiology. Critical lab result of Troponin I 0.08 was obtained. [facility] EKG showed normal sinus rhythm, possible Left atrial enlargement, possible inferior infarct (age undetermined), anterior infarct (age undetermined) and abnormal ECG since last EKG. Patient ruled in for a NSTEMI. He underwent a cardiac catheterization earlier today. Results showed severe three vessel coronary artery disease (please see results below). Dr. Tyrone SageGerhardt has been consulted for consideration of coronary artery bypass grafting surgery. At the time of being seen, he denied chest pain and shortness of breath, his vital signs are stable, and he is on a Heparin drip.   Potential risks, benefits, and complications  of the surgery were discussed with the patient and his girlfriend. Patient agreed to proceed with surgery. Of note, patient wanted to go home for the weekend and was highly advised against it. Dr.  Tyrone Sage cautioned him about leaving prior to surgery where he could not be safely monitored. Patient later agreed to stay. Pre operative carotid duplex US showed no significant internal carotid artery stenosis bilaterally. Patient underwent a CABG x 4 on 03/12/2018.   Brief Hospital Course:  The patient was extubated the evening of surgery without difficulty. He/she remained afebrile and hemodynamically stable. [name], a line, chest tubes, and foley were removed early in the post operative course. Lopressor was started and titrated accordingly. He/she was volume over loaded and diuresed. He had ABL anemia. He did not require a post op transfusion. Last H and H was 11.5 He/she was weaned off the insulin drip.  Once he was tolerating a diet, home diabetic medicines were restarted.  The patient's glucose remained well controlled.The patient's HGA1C pre op was 8.0. The patient was felt surgically stable for transfer from the ICU to PCTU for further convalescence on 03/14/2018. He continues to progress with cardiac rehab.  He developed Atrial Flutter.  He was started on IV Amiodarone.  He was rapid Atrial Paced and was not able to be converted to NSR.  Due to this he was started on Eliquis for stroke prevention.  If he remains in A. Flutter over the next several weeks, Cardiology may consider the patient for elective outpatient cardioversion. He was ambulating on room air. He/she has been tolerating a diet and has had a bowel movement. Epicardial pacing wires were removed on 03/16/2018. Chest tube sutures will be removed the day of discharge. The patient should monitor his blood glucose levels and keep his blood glucose level under 150. The patient is felt surgically stable for discharge today.    Latest Vital Signs: Blood pressure 119/72, pulse 69, temperature 98.1 F (36.7 C), temperature source Oral, resp. rate (!) 28, height 5' 10.6" (1.793 m), weight 92.5 kg (203 lb 14.4 oz), SpO2 95 %.  Physical  Exam: General appearance: alert Heart: irregularly irregular rhythm Lungs: clear to auscultation bilaterally Abdomen: soft, non-tender; bowel sounds normal; no masses,  no organomegaly Extremities: extremities normal, atraumatic, no cyanosis or edema Wound: there is some clear drainage on his current dressing on the bottom half of his incision.     Discharge Condition: Stable and discharge to home.  Recent laboratory studies:  Lab Results  Component Value Date   WBC 12.1 (H) 03/15/2018   HGB 11.5 (L) 03/15/2018   HCT 35.6 (L) 03/15/2018   MCV 85.4 03/15/2018   PLT 128 (L) 03/15/2018   Lab Results  Component Value Date   NA 133 (L) 03/17/2018   K 3.8 03/17/2018   CL 97 (L) 03/17/2018   CO2 27 03/17/2018   CREATININE 1.03 03/17/2018   GLUCOSE 146 (H) 03/17/2018      Diagnostic Studies: Dg Chest 2 View  Result Date: 03/15/2018 CLINICAL DATA:  Status post chest tube removal EXAM: CHEST - 2 VIEW COMPARISON:  03/14/2018 FINDINGS: Left-sided chest tube is been removed in the interval. Postsurgical changes are again seen. Stable cardiomegaly is noted. No recurrent pneumothorax is seen following chest tube removal. No focal infiltrate is seen. IMPRESSION: No recurrent pneumothorax following chest tube removal. Electronically Signed   By: Alcide Clever M.D.   On: 03/15/2018 09:20   Dg Chest 2 View  Result Date: 03/08/2018  CLINICAL DATA:  Intermittent chest pain EXAM: CHEST - 2 VIEW COMPARISON:  03/07/2018 FINDINGS: No focal airspace disease or effusion. Coarse chronic appearing bronchitic changes. Stable cardiomediastinal silhouette. Aortic atherosclerosis. No pneumothorax. IMPRESSION: No active cardiopulmonary disease.  Bronchitic changes. Electronically Signed   By: Jasmine Pang M.D.   On: 03/08/2018 18:11   Dg Chest 2 View  Result Date: 03/07/2018 CLINICAL DATA:  Preop cardiac catheterization/stent placement. History of smoking. EXAM: CHEST - 2 VIEW COMPARISON:  None. FINDINGS: The  cardiomediastinal silhouette is within normal limits. There is slight elevation of the right hemidiaphragm. Central airway thickening is noted, and there is interstitial coarsening most notable in the lower lungs. No confluent airspace opacity, overt pulmonary edema, pleural effusion, or pneumothorax is identified. No acute osseous abnormality is seen. IMPRESSION: Bronchitic changes. Electronically Signed   By: Sebastian Ache M.D.   On: 03/07/2018 16:03   Dg Chest Port 1 View  Result Date: 03/14/2018 CLINICAL DATA:  Status post coronary bypass graft. EXAM: PORTABLE CHEST 1 VIEW COMPARISON:  Radiograph of March 13, 2018. FINDINGS: Stable cardiomediastinal silhouette. Status post coronary artery bypass graft. Left-sided chest tube is unchanged in position. No pneumothorax is noted. Right internal jugular Swan-Ganz catheter has been removed. Right internal jugular venous sheath remains. Mild bibasilar subsegmental atelectasis remains. No significant pleural effusion is noted. Bony thorax is unremarkable. IMPRESSION: Stable position of left-sided chest tube without pneumothorax. Stable bibasilar subsegmental atelectasis. Electronically Signed   By: Lupita Raider, M.D.   On: 03/14/2018 07:32   Dg Chest Port 1 View  Result Date: 03/13/2018 CLINICAL DATA:  Post CABG EXAM: PORTABLE CHEST 1 VIEW COMPARISON:  Portable exam 0537 hours compared to 03/12/2018 FINDINGS: Interval removal of nasogastric and endotracheal tubes. RIGHT jugular Swan-Ganz catheter tip projects over distal main pulmonary artery near bifurcation. Epicardial pacing wires and LEFT thoracostomy tube unchanged. Enlargement of cardiac silhouette post CABG. Persistent RIGHT basilar atelectasis. Mild atelectasis versus infiltrate at LEFT base unchanged. No definite acute infiltrate or pneumothorax. IMPRESSION: Persistent postoperative changes as above. Electronically Signed   By: Ulyses Southward M.D.   On: 03/13/2018 09:12   Dg Chest Port 1 View  Result  Date: 03/12/2018 CLINICAL DATA:  Status post CABG x4. EXAM: PORTABLE CHEST 1 VIEW COMPARISON:  03/08/2018 FINDINGS: Endotracheal tube is in place, tip 4.4 centimeters above the carina. Nasogastric tube is in place with tip overlying the level of the stomach. A a RIGHT IJ Swan-Ganz catheter tip overlies the level of the pulmonary outflow tract. A LEFT-sided chest tube has been placed. The heart size is accentuated by the portable technique. There is bibasilar atelectasis versus consolidation, LEFT greater than RIGHT. No pneumothorax. IMPRESSION: 1. Postoperative changes. 2. Bibasilar atelectasis and/or consolidation. 3. No pneumothorax. Electronically Signed   By: Norva Pavlov M.D.   On: 03/12/2018 13:50    Discharge Medications: Allergies as of 03/17/2018   No Known Allergies     Medication List    STOP taking these medications   ADVIL 200 MG tablet Generic drug:  ibuprofen   nitroGLYCERIN 0.4 MG SL tablet Commonly known as:  NITROSTAT     TAKE these medications   acetaminophen 325 MG tablet Commonly known as:  TYLENOL Take 2 tablets (650 mg total) by mouth every 6 (six) hours as needed for mild pain.   amiodarone 200 MG tablet Commonly known as:  PACERONE Please take 2 tabs (400mg ) twice a day for 4 days then take 1 tab (200mg ) twice a day until  we see you in follow-up.   apixaban 5 MG Tabs tablet Commonly known as:  ELIQUIS Take 1 tablet (5 mg total) by mouth 2 (two) times daily.   aspirin EC 81 MG tablet Take 1 tablet (81 mg total) by mouth daily.   atorvastatin 80 MG tablet Commonly known as:  LIPITOR Take 1 tablet (80 mg total) by mouth daily. What changed:    medication strength  how much to take   lisinopril 2.5 MG tablet Commonly known as:  PRINIVIL,ZESTRIL Take 1 tablet (2.5 mg total) by mouth daily. What changed:    medication strength  how much to take   metFORMIN 500 MG 24 hr tablet Commonly known as:  GLUCOPHAGE-XR Take 2 tablets (1,000 mg total)  by mouth daily with breakfast.   metoprolol tartrate 25 MG tablet Commonly known as:  LOPRESSOR Take 1 tablet (25 mg total) by mouth 2 (two) times daily.   omeprazole 20 MG capsule Commonly known as:  PRILOSEC TAKE ONE CAPSULE BY MOUTH TWICE A DAY   oxyCODONE 5 MG immediate release tablet Commonly known as:  Oxy IR/ROXICODONE Take 1 tablet (5 mg total) by mouth every 6 (six) hours as needed for severe pain.      The patient has been discharged on:   1.Beta Blocker:  Yes [ X  ]                              No   [   ]                              If No, reason:  2.Ace Inhibitor/ARB: Yes Arly.Keller   ]                                     No  [    ]                                     If No, reason:  3.Statin:   Yes [ X  ]                  No  [   ]                  If No, reason:  4.Ecasa:  Yes  [ X  ]                  No   [   ]                  If No, reason:  Follow Up Appointments: Follow-up Information    Copland, Karleen Hampshire, MD. Call.   Specialty:  Family Medicine Why:  for a follow up appointment regarding further diabetes mangement and surveillance of HGA1C 8.4 Contact information: [address] [place] [address] [phone]        Delight Ovens, MD. Go on 04/23/2018.   Specialty:  Cardiothoracic Surgery Why:  PA/LAT CXR to be taken 12:30 (at [facility] which is in the same building as Dr. Dennie Maizes office on at; Appointement time is at 1:00 Contact information: [address] [place] [address] [phone]  Antoine Poche, MD Follow up.   Specialty:  Cardiology Why:  Cardiology hospital follow up on Monday August 19th at 10:00.  Arrive 15 minutes early for check-in. Contact information: [address] [place] [address] [phone]        [facility] Follow up.   Specialty:  Cardiology Why:  The afib clinic will contact you next week to schedule an appointment.    Contact information: [address] [id number] mc [place] [address] [phone]          Signed: Bernadette Hoit ContePA-C 03/17/2018, 8:45 AM

## 2018-03-14 ENCOUNTER — Inpatient Hospital Stay (HOSPITAL_COMMUNITY): Payer: 59

## 2018-03-14 LAB — GLUCOSE, CAPILLARY
GLUCOSE-CAPILLARY: 142 mg/dL — AB (ref 70–99)
GLUCOSE-CAPILLARY: 153 mg/dL — AB (ref 70–99)
GLUCOSE-CAPILLARY: 205 mg/dL — AB (ref 70–99)
Glucose-Capillary: 152 mg/dL — ABNORMAL HIGH (ref 70–99)
Glucose-Capillary: 155 mg/dL — ABNORMAL HIGH (ref 70–99)
Glucose-Capillary: 128 mg/dL — ABNORMAL HIGH (ref 70–99)
Glucose-Capillary: 163 mg/dL — ABNORMAL HIGH (ref 70–99)
Glucose-Capillary: 141 mg/dL — ABNORMAL HIGH (ref 70–99)
Glucose-Capillary: 142 mg/dL — ABNORMAL HIGH (ref 70–99)
Glucose-Capillary: 136 mg/dL — ABNORMAL HIGH (ref 70–99)

## 2018-03-14 LAB — BASIC METABOLIC PANEL
Anion gap: 9 (ref 5–15)
BUN: 10 mg/dL (ref 6–20)
CO2: 26 mmol/L (ref 22–32)
CREATININE: 0.81 mg/dL (ref 0.61–1.24)
Calcium: 8.3 mg/dL — ABNORMAL LOW (ref 8.9–10.3)
Chloride: 101 mmol/L (ref 98–111)
GFR calc Af Amer: >60 mL/min (ref >60)
GFR calc non Af Amer: >60 mL/min (ref >60)
Glucose, Bld: 139 mg/dL — ABNORMAL HIGH (ref 70–99)
POTASSIUM: 4.1 mmol/L (ref 3.5–5.1)
Sodium: 136 mmol/L (ref 135–145)

## 2018-03-14 LAB — CBC
HEMATOCRIT: 36.5 % — AB (ref 39.0–52.0)
Hemoglobin: 11.6 g/dL — ABNORMAL LOW (ref 13.0–17.0)
MCH: 27.7 pg (ref 26.0–34.0)
MCHC: 31.8 g/dL (ref 30.0–36.0)
MCV: 87.1 fL (ref 78.0–100.0)
Platelets: 105 10*3/uL — ABNORMAL LOW (ref 150–400)
RBC: 4.19 MIL/uL — ABNORMAL LOW (ref 4.22–5.81)
RDW: 13.6 % (ref 11.5–15.5)
WBC: 11.2 10*3/uL — ABNORMAL HIGH (ref 4.0–10.5)

## 2018-03-14 MED ORDER — BISACODYL 5 MG PO TBEC: 10.0000 mg | DELAYED_RELEASE_TABLET | Freq: Every day | ORAL | Status: DC | PRN

## 2018-03-14 MED ORDER — BISACODYL 10 MG RE SUPP: 10.0000 mg | Freq: Every day | RECTAL | Status: DC | PRN

## 2018-03-14 MED ORDER — PANTOPRAZOLE SODIUM 40 MG PO TBEC
40.0000 mg | DELAYED_RELEASE_TABLET | Freq: Every day | ORAL | Status: DC
  Administered 2018-03-15 – 2018-03-17 (×3): 40 mg via ORAL
  Filled 2018-03-14 (×3): qty 1

## 2018-03-14 MED ORDER — INSULIN ASPART 100 UNIT/ML ~~LOC~~ SOLN
0.0000 [IU] | Freq: Three times a day (TID) | SUBCUTANEOUS | Status: DC
  Administered 2018-03-14 (×3): 2 [IU] via SUBCUTANEOUS
  Administered 2018-03-15: 4 [IU] via SUBCUTANEOUS
  Administered 2018-03-15 – 2018-03-17 (×7): 2 [IU] via SUBCUTANEOUS

## 2018-03-14 MED ORDER — POTASSIUM CHLORIDE CRYS ER 20 MEQ PO TBCR
20.0000 meq | EXTENDED_RELEASE_TABLET | Freq: Every day | ORAL | Status: DC
  Administered 2018-03-14 – 2018-03-15 (×2): 20 meq via ORAL
  Filled 2018-03-14: qty 1

## 2018-03-14 MED ORDER — ALPRAZOLAM 0.25 MG PO TABS: 0.2500 mg | ORAL_TABLET | Freq: Four times a day (QID) | ORAL | Status: DC | PRN

## 2018-03-14 MED ORDER — ONDANSETRON HCL 4 MG PO TABS: 4.0000 mg | ORAL_TABLET | Freq: Four times a day (QID) | ORAL | Status: DC | PRN

## 2018-03-14 MED ORDER — SODIUM CHLORIDE 0.9% FLUSH: 3.0000 mL | INTRAVENOUS | Status: DC | PRN

## 2018-03-14 MED ORDER — TRAMADOL HCL 50 MG PO TABS: 50.0000 mg | ORAL_TABLET | ORAL | Status: DC | PRN

## 2018-03-14 MED ORDER — ACETAMINOPHEN 325 MG PO TABS: 650.0000 mg | ORAL_TABLET | Freq: Four times a day (QID) | ORAL | Status: DC | PRN

## 2018-03-14 MED ORDER — FUROSEMIDE 20 MG PO TABS
20.0000 mg | ORAL_TABLET | Freq: Every day | ORAL | Status: AC
  Administered 2018-03-14 – 2018-03-16 (×3): 20 mg via ORAL
  Filled 2018-03-14 (×3): qty 1

## 2018-03-14 MED ORDER — METFORMIN HCL ER 500 MG PO TB24
1000.0000 mg | ORAL_TABLET | Freq: Every day | ORAL | Status: DC
  Administered 2018-03-15 – 2018-03-17 (×3): 1000 mg via ORAL
  Filled 2018-03-14 (×3): qty 2

## 2018-03-14 MED ORDER — DOCUSATE SODIUM 100 MG PO CAPS
200.0000 mg | ORAL_CAPSULE | Freq: Every day | ORAL | Status: DC
  Administered 2018-03-14 – 2018-03-17 (×3): 200 mg via ORAL
  Filled 2018-03-14 (×3): qty 2

## 2018-03-14 MED ORDER — OXYCODONE HCL 5 MG PO TABS
5.0000 mg | ORAL_TABLET | ORAL | Status: DC | PRN
  Administered 2018-03-14 – 2018-03-16 (×13): 10 mg via ORAL
  Administered 2018-03-17: 5 mg via ORAL
  Administered 2018-03-17 (×3): 10 mg via ORAL
  Filled 2018-03-14 (×16): qty 2

## 2018-03-14 MED ORDER — ASPIRIN EC 325 MG PO TBEC
325.0000 mg | DELAYED_RELEASE_TABLET | Freq: Every day | ORAL | Status: DC
  Administered 2018-03-14 – 2018-03-16 (×3): 325 mg via ORAL
  Filled 2018-03-14 (×2): qty 1

## 2018-03-14 MED ORDER — METOPROLOL TARTRATE 12.5 MG HALF TABLET
12.5000 mg | ORAL_TABLET | Freq: Two times a day (BID) | ORAL | Status: DC
  Administered 2018-03-14 (×2): 12.5 mg via ORAL
  Filled 2018-03-14: qty 1

## 2018-03-14 MED ORDER — SODIUM CHLORIDE 0.9 % IV SOLN: 250.0000 mL | INTRAVENOUS | Status: DC | PRN

## 2018-03-14 MED ORDER — ALUM & MAG HYDROXIDE-SIMETH 200-200-20 MG/5ML PO SUSP: 15.0000 mL | ORAL | Status: DC | PRN

## 2018-03-14 MED ORDER — LISINOPRIL 2.5 MG PO TABS
2.5000 mg | ORAL_TABLET | Freq: Every day | ORAL | Status: DC
  Administered 2018-03-14 – 2018-03-17 (×4): 2.5 mg via ORAL
  Filled 2018-03-14 (×4): qty 1

## 2018-03-14 MED ORDER — GUAIFENESIN ER 600 MG PO TB12: 600.0000 mg | ORAL_TABLET | Freq: Two times a day (BID) | ORAL | Status: DC | PRN

## 2018-03-14 MED ORDER — ONDANSETRON HCL 4 MG/2ML IJ SOLN: 4.0000 mg | Freq: Four times a day (QID) | INTRAMUSCULAR | Status: DC | PRN

## 2018-03-14 MED ORDER — MOVING RIGHT ALONG BOOK
Freq: Once | Status: AC
  Administered 2018-03-14: 13:00:00
  Filled 2018-03-14: qty 1

## 2018-03-14 MED ORDER — SODIUM CHLORIDE 0.9% FLUSH
3.0000 mL | Freq: Two times a day (BID) | INTRAVENOUS | Status: DC
  Administered 2018-03-14 – 2018-03-16 (×4): 3 mL via INTRAVENOUS

## 2018-03-14 NOTE — Progress Notes (Signed)
Patient arrived to 4 E room 14 at this time. Telemetry monitor applied and CCMD notified. V/S and assessment done. Will continue to monitor. Patient oriented to room and how to call nurse with any needs.   Ernestina ColumbiaK. Starr Ladaija Dimino, RN

## 2018-03-14 NOTE — Progress Notes (Signed)
Patient ID: Ronald Stephens, male   DOB: [birth year], 57 y.o.   MRN: [id number] TCTS DAILY ICU PROGRESS NOTE                   [address]            [place] [address]          [phone]   2 Days Post-Op Procedure(s) (LRB): CORONARY ARTERY BYPASS GRAFTING (CABG) x 4, LIMA to LAD, SVG SEQUENTIALLY to DISTAL CIRCUMFLEX and RAMUS INTERMEDIATED, and SVG to DISTAL RCA,  USING LEFT INTERNAL MAMMARY ARTERY AND RIGHT GREATER SAPHENOUS VEIN HARVESTED ENDOSCOPICALLY (N/A) TRANSESOPHAGEAL ECHOCARDIOGRAM (TEE) (N/A) MEDIAN STERNOTOMY  Total Length of Stay:  LOS: 5 days   Subjective: Patient awake alert walked twice around the unit this morning  Objective: Vital signs in last 24 hours: Temp:  [97.7 F (36.5 C)-99.1 F (37.3 C)] 98.5 F (36.9 C) (07/31 0400) Pulse Rate:  [66-80] 72 (07/31 0500) Cardiac Rhythm: Normal sinus rhythm (07/30 2000) Resp:  [14-30] 21 (07/31 0500) BP: (107-130)/(64-75) 115/73 (07/31 0500) SpO2:  [92 %-99 %] 93 % (07/31 0500) Arterial Line BP: (125-145)/(54-61) 143/60 (07/30 1400) Weight:  [208 lb 1.8 oz (94.4 kg)] 208 lb 1.8 oz (94.4 kg) (07/31 0500)  Filed Weights   03/12/18 1332 03/13/18 0500 03/14/18 0500  Weight: 203 lb 14.8 oz (92.5 kg) 213 lb 14.4 oz (97 kg) 208 lb 1.8 oz (94.4 kg)    Weight change: 4 lb 3 oz (1.9 kg)   Hemodynamic parameters for last 24 hours: PAP: (27-32)/(15-17) 27/17 CO:  [3.6 L/min] 3.6 L/min CI:  [1.7 L/min/m2] 1.7 L/min/m2  Intake/Output from previous day: 07/30 0701 - 07/31 0700 In: 1695.8 [P.O.:240; I.V.:150; IV Piggyback:1305.9] Out: 2328 [Urine:2168; Chest Tube:160]  Intake/Output this shift: No intake/output data recorded.  Current Meds: Scheduled Meds: . acetaminophen  1,000 mg Oral Q6H   Or  . acetaminophen (TYLENOL) oral liquid 160 mg/5 mL  1,000 mg Per Tube Q6H  . aspirin EC  325 mg Oral Daily   Or  . aspirin  324 mg Per Tube Daily  . atorvastatin  80 mg Oral Daily  . bisacodyl  10  mg Oral Daily   Or  . bisacodyl  10 mg Rectal Daily  . Chlorhexidine Gluconate Cloth  6 each Topical Daily  . docusate sodium  200 mg Oral Daily  . insulin aspart  0-24 Units Subcutaneous Q4H  . insulin detemir  10 Units Subcutaneous Daily  . mouth rinse  15 mL Mouth Rinse BID  . metoprolol tartrate  12.5 mg Oral BID   Or  . metoprolol tartrate  12.5 mg Per Tube BID  . pantoprazole  40 mg Oral Daily  . potassium chloride  40 mEq Oral BID  . sodium chloride flush  10-40 mL Intracatheter Q12H  . sodium chloride flush  3 mL Intravenous Q12H  . thiamine  100 mg Oral Daily   Continuous Infusions: . sodium chloride Stopped (03/13/18 0553)  . sodium chloride    . sodium chloride 10 mL/hr at 03/13/18 2027  . cefUROXime (ZINACEF)  IV 1.5 g (03/13/18 2030)  . dexmedetomidine (PRECEDEX) IV infusion Stopped (03/12/18 1612)  . lactated ringers    . lactated ringers    . lactated ringers 20 mL/hr at 03/13/18 0600  . nitroGLYCERIN Stopped (03/12/18 1326)  . phenylephrine (NEO-SYNEPHRINE) Adult infusion Stopped (03/13/18 0553)   PRN Meds:.sodium chloride, lactated ringers, levalbuterol, metoprolol tartrate, midazolam, morphine injection, ondansetron (ZOFRAN)  IV, oxyCODONE, sodium chloride flush, sodium chloride flush, traMADol  General appearance: alert, cooperative and no distress Neurologic: intact Heart: regular rate and rhythm, S1, S2 normal, no murmur, click, rub or gallop Lungs: diminished breath sounds bibasilar Abdomen: soft, non-tender; bowel sounds normal; no masses,  no organomegaly Extremities: extremities normal, atraumatic, no cyanosis or edema and Homans sign is negative, no sign of DVT Wound: Sternum stable  Lab Results: CBC: Recent Labs    03/13/18 1631 03/14/18 0445  WBC 11.2* 11.2*  HGB 12.3* 11.6*  HCT 38.3* 36.5*  PLT 111* 105*   BMET:  Recent Labs    03/13/18 0339 03/13/18 1631 03/14/18 0445  NA 137  --  136  K 3.7  --  4.1  CL 108  --  101  CO2 23  --   26  GLUCOSE 106*  --  139*  BUN 8  --  10  CREATININE 0.75 0.85 0.81  CALCIUM 7.8*  --  8.3*    CMET: Lab Results  Component Value Date   WBC 11.2 (H) 03/14/2018   HGB 11.6 (L) 03/14/2018   HCT 36.5 (L) 03/14/2018   PLT 105 (L) 03/14/2018   GLUCOSE 139 (H) 03/14/2018   CHOL 154 03/09/2018   TRIG 318 (H) 03/09/2018   HDL 27 (L) 03/09/2018   LDLDIRECT 174.5 06/05/2013   LDLCALC 63 03/09/2018   ALT 23 03/11/2018   AST 21 03/11/2018   NA 136 03/14/2018   K 4.1 03/14/2018   CL 101 03/14/2018   CREATININE 0.81 03/14/2018   BUN 10 03/14/2018   CO2 26 03/14/2018   TSH 2.50 10/08/2010   PSA 0.26 02/28/2018   INR 1.46 03/12/2018   HGBA1C 8.0 (H) 03/11/2018   MICROALBUR 1.0 03/22/2010      PT/INR:  Recent Labs    03/12/18 1336  LABPROT 17.6*  INR 1.46   Radiology: Dg Chest Port 1 View  Result Date: 03/14/2018 CLINICAL DATA:  Status post coronary bypass graft. EXAM: PORTABLE CHEST 1 VIEW COMPARISON:  Radiograph of March 13, 2018. FINDINGS: Stable cardiomediastinal silhouette. Status post coronary artery bypass graft. Left-sided chest tube is unchanged in position. No pneumothorax is noted. Right internal jugular Swan-Ganz catheter has been removed. Right internal jugular venous sheath remains. Mild bibasilar subsegmental atelectasis remains. No significant pleural effusion is noted. Bony thorax is unremarkable. IMPRESSION: Stable position of left-sided chest tube without pneumothorax. Stable bibasilar subsegmental atelectasis. Electronically Signed   By: Lupita RaiderJames  Green Jr, M.D.   On: 03/14/2018 07:32     Assessment/Plan: S/P Procedure(s) (LRB): CORONARY ARTERY BYPASS GRAFTING (CABG) x 4, LIMA to LAD, SVG SEQUENTIALLY to DISTAL CIRCUMFLEX and RAMUS INTERMEDIATED, and SVG to DISTAL RCA,  USING LEFT INTERNAL MAMMARY ARTERY AND RIGHT GREATER SAPHENOUS VEIN HARVESTED ENDOSCOPICALLY (N/A) TRANSESOPHAGEAL ECHOCARDIOGRAM (TEE) (N/A) MEDIAN STERNOTOMY Mobilize Diuresis d/c  tubes/lines Plan for transfer to step-down: see transfer orders     Delight Ovensdward B Aslynn Brunetti 03/14/2018 8:06 AM

## 2018-03-14 NOTE — Progress Notes (Signed)
CARDIAC REHAB PHASE I   PRE:  Rate/Rhythm: 79 SR    BP: sitting 135/74    SaO2: 94 2L  MODE:  Ambulation: 650 ft   POST:  Rate/Rhythm: 96 SR    BP: sitting 125/72     SaO2: 89-90 RA, up to 96 RA with rest  Pt moving well. Able to get out of bed independently. Used RW. Steady. Walked without O2, SaO2 maintained 89-90 RA while walking. In bed SaO2 96 RA so left off. Encouraged IS, flutter, and another walk. [phone]   Ronald MassonRandi Kristan Ronald Stephens CES, ACSM 03/14/2018 3:31 PM

## 2018-03-15 ENCOUNTER — Inpatient Hospital Stay (HOSPITAL_COMMUNITY): Payer: 59

## 2018-03-15 LAB — POCT I-STAT, CHEM 8
BUN: 8 mg/dL (ref 6–20)
CHLORIDE: 99 mmol/L (ref 98–111)
Calcium, Ion: 1.15 mmol/L (ref 1.15–1.40)
Creatinine, Ser: 0.7 mg/dL (ref 0.61–1.24)
Glucose, Bld: 163 mg/dL — ABNORMAL HIGH (ref 70–99)
HEMATOCRIT: 38 % — AB (ref 39.0–52.0)
Hemoglobin: 12.9 g/dL — ABNORMAL LOW (ref 13.0–17.0)
POTASSIUM: 3.8 mmol/L (ref 3.5–5.1)
Sodium: 137 mmol/L (ref 135–145)
TCO2: 23 mmol/L (ref 22–32)

## 2018-03-15 LAB — GLUCOSE, CAPILLARY
GLUCOSE-CAPILLARY: 129 mg/dL — AB (ref 70–99)
GLUCOSE-CAPILLARY: 144 mg/dL — AB (ref 70–99)
GLUCOSE-CAPILLARY: 166 mg/dL — AB (ref 70–99)
GLUCOSE-CAPILLARY: 127 mg/dL — AB (ref 70–99)

## 2018-03-15 LAB — BASIC METABOLIC PANEL
Anion gap: 9 (ref 5–15)
BUN: 10 mg/dL (ref 6–20)
CO2: 24 mmol/L (ref 22–32)
Calcium: 8.2 mg/dL — ABNORMAL LOW (ref 8.9–10.3)
Chloride: 98 mmol/L (ref 98–111)
Creatinine, Ser: 0.91 mg/dL (ref 0.61–1.24)
GFR calc Af Amer: >60 mL/min (ref >60)
GFR calc non Af Amer: >60 mL/min (ref >60)
Glucose, Bld: 132 mg/dL — ABNORMAL HIGH (ref 70–99)
Potassium: 3.7 mmol/L (ref 3.5–5.1)
Sodium: 131 mmol/L — ABNORMAL LOW (ref 135–145)

## 2018-03-15 LAB — CBC
HCT: 35.6 % — ABNORMAL LOW (ref 39.0–52.0)
Hemoglobin: 11.5 g/dL — ABNORMAL LOW (ref 13.0–17.0)
MCH: 27.6 pg (ref 26.0–34.0)
MCHC: 32.3 g/dL (ref 30.0–36.0)
MCV: 85.4 fL (ref 78.0–100.0)
Platelets: 128 10*3/uL — ABNORMAL LOW (ref 150–400)
RBC: 4.17 MIL/uL — ABNORMAL LOW (ref 4.22–5.81)
RDW: 13.3 % (ref 11.5–15.5)
WBC: 12.1 10*3/uL — ABNORMAL HIGH (ref 4.0–10.5)

## 2018-03-15 MED ORDER — METOPROLOL TARTRATE 25 MG PO TABS
25.0000 mg | ORAL_TABLET | Freq: Two times a day (BID) | ORAL | Status: DC
  Administered 2018-03-15 – 2018-03-17 (×5): 25 mg via ORAL
  Filled 2018-03-15 (×5): qty 1

## 2018-03-15 MED ORDER — AMIODARONE HCL IN DEXTROSE 360-4.14 MG/200ML-% IV SOLN
60.0000 mg/h | INTRAVENOUS | Status: AC
  Administered 2018-03-15: 60 mg/h via INTRAVENOUS
  Filled 2018-03-15: qty 200

## 2018-03-15 MED ORDER — AMIODARONE HCL IN DEXTROSE 360-4.14 MG/200ML-% IV SOLN
30.0000 mg/h | INTRAVENOUS | Status: DC
  Administered 2018-03-15 – 2018-03-16 (×3): 30 mg/h via INTRAVENOUS
  Filled 2018-03-15 (×3): qty 200

## 2018-03-15 NOTE — Progress Notes (Addendum)
      [address]       [place] [id number]             [phone]      3 Days Post-Op Procedure(s) (LRB): CORONARY ARTERY BYPASS GRAFTING (CABG) x 4, LIMA to LAD, SVG SEQUENTIALLY to DISTAL CIRCUMFLEX and RAMUS INTERMEDIATED, and SVG to DISTAL RCA,  USING LEFT INTERNAL MAMMARY ARTERY AND RIGHT GREATER SAPHENOUS VEIN HARVESTED ENDOSCOPICALLY (N/A) TRANSESOPHAGEAL ECHOCARDIOGRAM (TEE) (N/A) MEDIAN STERNOTOMY Subjective: Worried this morning about the atrial fibrillation. Otherwise doing okay.   Objective: Vital signs in last 24 hours: Temp:  [98 F (36.7 C)-98.9 F (37.2 C)] 98.5 F (36.9 C) (08/01 0717) Pulse Rate:  [76-108] 83 (08/01 0717) Cardiac Rhythm: Atrial flutter (08/01 0700) Resp:  [18-30] 27 (08/01 0717) BP: (105-135)/(64-80) 127/72 (08/01 0717) SpO2:  [90 %-100 %] 98 % (08/01 0717) Weight:  [94.8 kg (208 lb 15.9 oz)] 94.8 kg (208 lb 15.9 oz) (08/01 0407)     Intake/Output from previous day: 07/31 0701 - 08/01 0700 In: 200 [IV Piggyback:200] Out: 620 [Urine:600; Chest Tube:20] Intake/Output this shift: No intake/output data recorded.  General appearance: alert, cooperative and no distress Heart: irregularly irregular rhythm Lungs: clear to auscultation bilaterally Abdomen: diminished bowel sounds Extremities: extremities normal, atraumatic, no cyanosis or edema Wound: clean and dry  Lab Results: Recent Labs    03/14/18 0445 03/15/18 0419  WBC 11.2* 12.1*  HGB 11.6* 11.5*  HCT 36.5* 35.6*  PLT 105* 128*   BMET:  Recent Labs    03/14/18 0445 03/15/18 0419  NA 136 131*  K 4.1 3.7  CL 101 98  CO2 26 24  GLUCOSE 139* 132*  BUN 10 10  CREATININE 0.81 0.91  CALCIUM 8.3* 8.2*    PT/INR:  Recent Labs    03/12/18 1336  LABPROT 17.6*  INR 1.46   ABG    Component Value Date/Time   PHART 7.370 03/12/2018 1801   HCO3 25.2 03/12/2018 1801   TCO2 23 03/13/2018 1710   ACIDBASEDEF 2.0 03/12/2018 1643   O2SAT 98.0 03/12/2018  1801   CBG (last 3)  Recent Labs    03/14/18 1659 03/14/18 2053 03/15/18 0556  GLUCAP 142* 136* 129*    Assessment/Plan: S/P Procedure(s) (LRB): CORONARY ARTERY BYPASS GRAFTING (CABG) x 4, LIMA to LAD, SVG SEQUENTIALLY to DISTAL CIRCUMFLEX and RAMUS INTERMEDIATED, and SVG to DISTAL RCA,  USING LEFT INTERNAL MAMMARY ARTERY AND RIGHT GREATER SAPHENOUS VEIN HARVESTED ENDOSCOPICALLY (N/A) TRANSESOPHAGEAL ECHOCARDIOGRAM (TEE) (N/A) MEDIAN STERNOTOMY  1. CV-a flutter this morning, Amio gtt initiation.  BP well controlled. Continue ASA, statin, ACEI, and lopressor.  2. Pulm-tolerating room air with good oxygen saturation. Continue Xopenex treatments.  3. Renal- continue lasix for fluid overload. Creatinine 0.91, electrolytes okay 4. H and H is 11.5/35.6, platelets 128k 5. Endo-blood glucose well controlled on current regimen  Plan: hold off on removal of EPW until rhythm is stable x 24 hours. In and out of a flutter. Will increase metoprolol to 25mg  BID. Attempting to a-pace. Work on rhythm control today and ambulate. Encouraged incentive spirometer.    LOS: 6 days    Sharlene Doryessa N Conte 03/15/2018  Aflutter this am, on iv Cordarone  Attempt to rapid a pace , now afib  I have seen and examined Pat PatrickJose Esteves-Herrera and agree with the above assessment  and plan.  Delight OvensEdward B Saunders [place] MD Beeper [phone] Office [phone] 03/15/2018 8:51 AM

## 2018-03-15 NOTE — Progress Notes (Signed)
Pt ambulated approx 27700ft, RA, rolling walker.  HR stayed between 100-120s, still in AF/AFL.

## 2018-03-15 NOTE — Progress Notes (Signed)
Pt refusing walk at this time.  Educated on importance of activity, even small efforts. He expressed anxiety about HR/rhythm and that it "takes it out" of him.  Told patient I would be back before dinner and we would attempt to walk again, even short distance.  He reluctantly agreed.

## 2018-03-15 NOTE — Progress Notes (Signed)
Progress Note  Patient Name: Ronald Stephens Date of Encounter: 03/15/2018  Primary Cardiologist:   Norman Herrlich, MD   Subjective   Mild usual chest soreness.  No SOB.   Inpatient Medications    Scheduled Meds: . aspirin EC  325 mg Oral Daily  . atorvastatin  80 mg Oral Daily  . docusate sodium  200 mg Oral Daily  . furosemide  20 mg Oral Daily  . insulin aspart  0-24 Units Subcutaneous TID AC & HS  . insulin detemir  10 Units Subcutaneous Daily  . lisinopril  2.5 mg Oral Daily  . mouth rinse  15 mL Mouth Rinse BID  . metFORMIN  1,000 mg Oral Q breakfast  . metoprolol tartrate  25 mg Oral BID  . pantoprazole  40 mg Oral QAC breakfast  . potassium chloride  20 mEq Oral Daily  . sodium chloride flush  3 mL Intravenous Q12H  . thiamine  100 mg Oral Daily   Continuous Infusions: . sodium chloride    . amiodarone 60 mg/hr (03/15/18 0436)  . amiodarone     PRN Meds: sodium chloride, acetaminophen, ALPRAZolam, alum & mag hydroxide-simeth, bisacodyl **OR** bisacodyl, guaiFENesin, levalbuterol, ondansetron **OR** ondansetron (ZOFRAN) IV, oxyCODONE, sodium chloride flush, traMADol   Vital Signs    Vitals:   03/14/18 2158 03/14/18 2355 03/15/18 0407 03/15/18 0717  BP: 114/64 111/71 106/71 127/72  Pulse: 87 85 (!) 108 83  Resp:  (!) 25 (!) 24 (!) 27  Temp:  98.9 F (37.2 C) 98 F (36.7 C) 98.5 F (36.9 C)  TempSrc:  Oral Oral Oral  SpO2:  91% 90% 98%  Weight:   208 lb 15.9 oz (94.8 kg)   Height:        Intake/Output Summary (Last 24 hours) at 03/15/2018 0915 Last data filed at 03/15/2018 0300 Gross per 24 hour  Intake 0 ml  Output 600 ml  Net -600 ml   Filed Weights   03/13/18 0500 03/14/18 0500 03/15/18 0407  Weight: 213 lb 14.4 oz (97 kg) 208 lb 1.8 oz (94.4 kg) 208 lb 15.9 oz (94.8 kg)    Telemetry    Intermittent atrial flutter/NSR  - Personally Reviewed  ECG    NA - Personally Reviewed  Physical Exam   GEN: No acute distress.   Neck: No   JVD Cardiac: Irregular RR, no murmurs, rubs, or gallops.  Respiratory:   Basilar crackles.   GI: Soft, nontender, non-distended  MS: Mild right leg edema; No deformity. Neuro:  Nonfocal  Psych: Normal affect   Labs    Chemistry Recent Labs  Lab 03/11/18 0612  03/13/18 0339 03/13/18 1631 03/13/18 1710 03/14/18 0445 03/15/18 0419  NA 137   < > 137  --  137 136 131*  K 4.0   < > 3.7  --  3.8 4.1 3.7  CL 102   < > 108  --  99 101 98  CO2 27   < > 23  --   --  26 24  GLUCOSE 186*   < > 106*  --  163* 139* 132*  BUN 11   < > 8  --  8 10 10   CREATININE 0.95   < > 0.75 0.85 0.70 0.81 0.91  CALCIUM 8.8*   < > 7.8*  --   --  8.3* 8.2*  PROT 7.0  --   --   --   --   --   --   ALBUMIN 3.2*  --   --   --   --   --   --  AST 21  --   --   --   --   --   --   ALT 23  --   --   --   --   --   --   ALKPHOS 52  --   --   --   --   --   --   BILITOT 0.7  --   --   --   --   --   --   GFRNONAA >60   < > >60 >60  --  >60 >60  GFRAA >60   < > >60 >60  --  >60 >60  ANIONGAP 8   < > 6  --   --  9 9   < > = values in this interval not displayed.     Hematology Recent Labs  Lab 03/13/18 1631 03/13/18 1710 03/14/18 0445 03/15/18 0419  WBC 11.2*  --  11.2* 12.1*  RBC 4.41  --  4.19* 4.17*  HGB 12.3* 12.9* 11.6* 11.5*  HCT 38.3* 38.0* 36.5* 35.6*  MCV 86.8  --  87.1 85.4  MCH 27.9  --  27.7 27.6  MCHC 32.1  --  31.8 32.3  RDW 13.6  --  13.6 13.3  PLT 111*  --  105* 128*   Lab Results  Component Value Date   CHOL 154 03/09/2018   TRIG 318 (H) 03/09/2018   HDL 27 (L) 03/09/2018   LDLCALC 63 03/09/2018   LDLDIRECT 174.5 06/05/2013     Cardiac Enzymes Recent Labs  Lab 03/09/18 0244 03/09/18 0913 03/09/18 1346  TROPONINI <0.03 <0.03 <0.03    Recent Labs  Lab 03/08/18 1714  TROPIPOC 0.01     BNPNo results for input(s): BNP, PROBNP in the last 168 hours.   DDimer No results for input(s): DDIMER in the last 168 hours.   Radiology    Dg Chest Port 1 View  Result  Date: 03/14/2018 CLINICAL DATA:  Status post coronary bypass graft. EXAM: PORTABLE CHEST 1 VIEW COMPARISON:  Radiograph of March 13, 2018. FINDINGS: Stable cardiomediastinal silhouette. Status post coronary artery bypass graft. Left-sided chest tube is unchanged in position. No pneumothorax is noted. Right internal jugular Swan-Ganz catheter has been removed. Right internal jugular venous sheath remains. Mild bibasilar subsegmental atelectasis remains. No significant pleural effusion is noted. Bony thorax is unremarkable. IMPRESSION: Stable position of left-sided chest tube without pneumothorax. Stable bibasilar subsegmental atelectasis. Electronically Signed   By: Lupita Raider, M.D.   On: 03/14/2018 07:32    Cardiac Studies   Cath 03/09/18   Severe three-vessel coronary artery disease.  Moderate three-vessel coronary calcification.  Greater than 95% proximal LAD and 70% mid to distal.  First diagonal with multifocal 90% stenosis.  The LAD wraps around the left ventricular apex.  Ramus intermedius with proximal and mid 90% stenoses.  Circumflex is totally occluded after a large first obtuse marginal.  The obtuse marginal contains 70% small to mid stenosis.  Left to left collaterals to the occluded segment.  Dominant right coronary totally occluded proximal to mid segment with right to right and left to right collaterals.  Mild inferior hypokinesis.  EF 50 to 55%.  Normal LVEDP  Patient Profile     57 y.o. 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  He had CAD as above on cath this admission and is now status post CABG with  atrial flutter post op.     Assessment & Plan    CAD/CABG:   Post op care per TCTS  TOBACCO ABUSE:   He says that he has quit  ATRIAL FLUTTER: Intermittent over the last 24 hours.  Amiodarone started.     HTN:  BP controlled.    DYSLIPIDEMIA:  On  appropriate therapy.  Trig elevated as an outpatient and we will follow up on this.  DISPOSITION:  At discharge [facility] since he lives in [place].  We will arrange this.     For questions or updates, please contact [facility] Please consult [email or web address] for contact info under Cardiology/STEMI.   Signed, Rollene RotundaJames Charlisha Market, MD  03/15/2018, 9:15 AM

## 2018-03-15 NOTE — Progress Notes (Signed)
CARDIAC REHAB PHASE I   PRE:  Rate/Rhythm: 97 aflutter/fib    BP: sitting 103/64    SaO2: 93 RA  MODE:  Ambulation: 190 ft   POST:  Rate/Rhythm: 153 aflutter/fib at max    BP: sitting 123/76     SaO2: 93 RA  Pt sts he doesn't notice his HR. Rate up with activity. If he rested and deep breathed, rate would come down but then back up with further activity. Pt wanted to return to room, to recliner. BP and SaO2 stable. Encouraged more walking later.  [phone]   [name] CES, ACSM 03/15/2018 10:07 AM

## 2018-03-16 ENCOUNTER — Ambulatory Visit (HOSPITAL_COMMUNITY): Admission: RE | Admit: 2018-03-16 | Payer: 59 | Source: Ambulatory Visit | Admitting: Internal Medicine

## 2018-03-16 ENCOUNTER — Encounter (HOSPITAL_COMMUNITY): Admission: RE | Payer: Self-pay | Source: Ambulatory Visit

## 2018-03-16 LAB — GLUCOSE, CAPILLARY
GLUCOSE-CAPILLARY: 142 mg/dL — AB (ref 70–99)
GLUCOSE-CAPILLARY: 119 mg/dL — AB (ref 70–99)
Glucose-Capillary: 146 mg/dL — ABNORMAL HIGH (ref 70–99)
Glucose-Capillary: 130 mg/dL — ABNORMAL HIGH (ref 70–99)

## 2018-03-16 SURGERY — LEFT HEART CATH AND CORONARY ANGIOGRAPHY
Anesthesia: LOCAL

## 2018-03-16 MED ORDER — POTASSIUM CHLORIDE CRYS ER 20 MEQ PO TBCR
40.0000 meq | EXTENDED_RELEASE_TABLET | Freq: Every day | ORAL | Status: AC
  Administered 2018-03-16: 40 meq via ORAL
  Filled 2018-03-16: qty 2

## 2018-03-16 MED ORDER — APIXABAN 5 MG PO TABS
5.0000 mg | ORAL_TABLET | Freq: Two times a day (BID) | ORAL | Status: DC
  Administered 2018-03-16 – 2018-03-17 (×2): 5 mg via ORAL
  Filled 2018-03-16 (×2): qty 1

## 2018-03-16 MED ORDER — ASPIRIN EC 81 MG PO TBEC
81.0000 mg | DELAYED_RELEASE_TABLET | Freq: Every day | ORAL | Status: DC
  Administered 2018-03-17: 81 mg via ORAL
  Filled 2018-03-16: qty 1

## 2018-03-16 MED ORDER — AMIODARONE HCL 200 MG PO TABS
400.0000 mg | ORAL_TABLET | Freq: Two times a day (BID) | ORAL | Status: DC
  Administered 2018-03-16 – 2018-03-17 (×3): 400 mg via ORAL
  Filled 2018-03-16 (×4): qty 2

## 2018-03-16 MED FILL — Lidocaine HCl(Cardiac) IV PF Soln Pref Syr 100 MG/5ML (2%): INTRAVENOUS | Qty: 5 | Status: AC

## 2018-03-16 MED FILL — Mannitol IV Soln 20%: INTRAVENOUS | Qty: 500 | Status: AC

## 2018-03-16 MED FILL — Heparin Sodium (Porcine) Inj 1000 Unit/ML: INTRAMUSCULAR | Qty: 10 | Status: AC

## 2018-03-16 MED FILL — Electrolyte-R (PH 7.4) Solution: INTRAVENOUS | Qty: 5000 | Status: AC

## 2018-03-16 MED FILL — Sodium Chloride IV Soln 0.9%: INTRAVENOUS | Qty: 2000 | Status: AC

## 2018-03-16 MED FILL — Sodium Bicarbonate IV Soln 8.4%: INTRAVENOUS | Qty: 50 | Status: AC

## 2018-03-16 NOTE — Progress Notes (Signed)
   I have cardiology made follow up appt in [place]. I have left a message with the afib clinic to call the patient and arrange to be seen next week to further evaluate his aflutter. If he should convert to Sinus rhythm prior to discharge and not needed to be seen in afib clinic, please leave a message for their office to disregard my request for appt. [phone]  Berton BonJanine Gertrude Tarbet, AGNP-C [facility] 03/16/2018  4:51 PM Pager: [phone]

## 2018-03-16 NOTE — Progress Notes (Signed)
Pacing wires removed and intact. BP monitored every 15mins. Pt on bedrest for 1 hr. No complaints currently. Lacy DuverneyJennifer Geanie Pacifico, RN

## 2018-03-16 NOTE — Progress Notes (Addendum)
Progress Note  Patient Name: Ronald Stephens Date of Encounter: 03/16/2018  Primary Cardiologist: Norman Herrlich, MD   Subjective   Patient has expected surgical chest discomfort.  Denies any difficulty breathing.  Has been up in the hall walking with cardiac rehab without problems.  Inpatient Medications    Scheduled Meds: . aspirin EC  325 mg Oral Daily  . atorvastatin  80 mg Oral Daily  . docusate sodium  200 mg Oral Daily  . furosemide  20 mg Oral Daily  . insulin aspart  0-24 Units Subcutaneous TID AC & HS  . insulin detemir  10 Units Subcutaneous Daily  . lisinopril  2.5 mg Oral Daily  . mouth rinse  15 mL Mouth Rinse BID  . metFORMIN  1,000 mg Oral Q breakfast  . metoprolol tartrate  25 mg Oral BID  . pantoprazole  40 mg Oral QAC breakfast  . potassium chloride  40 mEq Oral Daily  . sodium chloride flush  3 mL Intravenous Q12H  . thiamine  100 mg Oral Daily   Continuous Infusions: . sodium chloride    . amiodarone 30 mg/hr (03/15/18 2137)   PRN Meds: sodium chloride, acetaminophen, ALPRAZolam, alum & mag hydroxide-simeth, bisacodyl **OR** bisacodyl, guaiFENesin, levalbuterol, ondansetron **OR** ondansetron (ZOFRAN) IV, oxyCODONE, sodium chloride flush, traMADol   Vital Signs    Vitals:   03/15/18 1949 03/15/18 2359 03/16/18 0430 03/16/18 0749  BP: 104/61 112/66 124/71 125/81  Pulse: 81 75 77 77  Resp: 15 (!) 21 (!) 23 18  Temp: 98.9 F (37.2 C) 98 F (36.7 C) 98.8 F (37.1 C) 98.6 F (37 C)  TempSrc: Oral Oral Oral Oral  SpO2: 94% 93% 94% 98%  Weight:   206 lb 5.6 oz (93.6 kg)   Height:        Intake/Output Summary (Last 24 hours) at 03/16/2018 0817 Last data filed at 03/16/2018 0700 Gross per 24 hour  Intake 1068.17 ml  Output 2300 ml  Net -1231.83 ml   Filed Weights   03/14/18 0500 03/15/18 0407 03/16/18 0430  Weight: 208 lb 1.8 oz (94.4 kg) 208 lb 15.9 oz (94.8 kg) 206 lb 5.6 oz (93.6 kg)    Telemetry    Flutter with rates in the 70s-80s-  Personally Reviewed  ECG    No new tracings- Personally Reviewed  Physical Exam   GEN: No acute distress.   Neck: No JVD Cardiac:  Mildly irregular rhythm, no murmurs, rubs, or gallops.  Respiratory:  Few crackles in the bases GI: Soft, nontender, non-distended  MS:  Generalized nonpitting mild edema; No deformity. Neuro:  Nonfocal  Psych: Normal affect   Labs    Chemistry Recent Labs  Lab 03/11/18 0612  03/13/18 0339 03/13/18 1631 03/13/18 1710 03/14/18 0445 03/15/18 0419  NA 137   < > 137  --  137 136 131*  K 4.0   < > 3.7  --  3.8 4.1 3.7  CL 102   < > 108  --  99 101 98  CO2 27   < > 23  --   --  26 24  GLUCOSE 186*   < > 106*  --  163* 139* 132*  BUN 11   < > 8  --  8 10 10   CREATININE 0.95   < > 0.75 0.85 0.70 0.81 0.91  CALCIUM 8.8*   < > 7.8*  --   --  8.3* 8.2*  PROT 7.0  --   --   --   --   --   --  ALBUMIN 3.2*  --   --   --   --   --   --   AST 21  --   --   --   --   --   --   ALT 23  --   --   --   --   --   --   ALKPHOS 52  --   --   --   --   --   --   BILITOT 0.7  --   --   --   --   --   --   GFRNONAA >60   < > >60 >60  --  >60 >60  GFRAA >60   < > >60 >60  --  >60 >60  ANIONGAP 8   < > 6  --   --  9 9   < > = values in this interval not displayed.     Hematology Recent Labs  Lab 03/13/18 1631 03/13/18 1710 03/14/18 0445 03/15/18 0419  WBC 11.2*  --  11.2* 12.1*  RBC 4.41  --  4.19* 4.17*  HGB 12.3* 12.9* 11.6* 11.5*  HCT 38.3* 38.0* 36.5* 35.6*  MCV 86.8  --  87.1 85.4  MCH 27.9  --  27.7 27.6  MCHC 32.1  --  31.8 32.3  RDW 13.6  --  13.6 13.3  PLT 111*  --  105* 128*    Cardiac Enzymes Recent Labs  Lab 03/09/18 0913 03/09/18 1346  TROPONINI <0.03 <0.03   No results for input(s): TROPIPOC in the last 168 hours.   BNPNo results for input(s): BNP, PROBNP in the last 168 hours.   DDimer No results for input(s): DDIMER in the last 168 hours.   Radiology    Dg Chest 2 View  Result Date: 03/15/2018 CLINICAL DATA:  Status  post chest tube removal EXAM: CHEST - 2 VIEW COMPARISON:  03/14/2018 FINDINGS: Left-sided chest tube is been removed in the interval. Postsurgical changes are again seen. Stable cardiomegaly is noted. No recurrent pneumothorax is seen following chest tube removal. No focal infiltrate is seen. IMPRESSION: No recurrent pneumothorax following chest tube removal. Electronically Signed   By: Alcide Clever M.D.   On: 03/15/2018 09:20    Cardiac Studies   Intraoperative TEE 03/12/2018  Septum: No PFO. No VSD. Negative bubble study.  Left atrium: Patent foramen ovale not present. No evidence of LAA thrombus with PWD velocities > 40 mm/s in LAA.  Aortic valve: The valve is trileaflet. No stenosis. No regurgitation.  Mitral valve: Trace regurgitation. Central jet.  Right ventricle: Normal cavity size and ejection fraction. No thrombus present.  Left ventricle: Normal size. LVEF 50-55%. Minimally hypokinetic inferior wall. No other significant regional wall motion abnormalities.   Post Bypass TEE: Tricuspid, Pulmonic, Mitral and Aortic valve unchanged. LVEF unchanged. CO 5.2, CI >2.2 with minimal vasopressor support. No aortic dissection noted after cannula removal.   Cath 03/09/18  Severe three-vessel coronary artery disease. Moderate three-vessel coronary calcification.  Greater than 95% proximal LAD and 70% mid to distal. First diagonal with multifocal 90% stenosis. The LAD wraps around the left ventricular apex.  Ramus intermedius with proximal and mid 90% stenoses.  Circumflex is totally occluded after a large first obtuse marginal. The obtuse marginal contains 70% small to mid stenosis. Left to left collaterals to the occluded segment.  Dominant right coronary totally occluded proximal to mid segment with right to right and left to right collaterals.  Mild inferior  hypokinesis. EF 50 to 55%. Normal LVEDP   Patient Profile     57 y.o. 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  He had CAD as above on cath this admission and is now status post CABG with atrial flutter post op.    Assessment & Plan    CAD: S/P CABG 03/12/18. Postop care per TCTS. On aspirin 325 mg.   Atrial flutter: Developed aflutter yesterday am. IV Amiodarone load in process. On metoprolol 25 mg bid. Pt continues to be in aflutter rate controlled.  Patient is tolerating it well.  Dr. Tyrone SageGerhardt attempted to rapid pace the patient out of atrial flutter twice this morning without success.  Electrolytes are stable.  We will discuss possible increase in beta-blocker with Dr. Antoine PocheHochrein. He will need anticoagulation for CHA2DS2/VAS Stroke Risk Score of 3 (Vasc dz, HTN, DM) but will need to wait until after pacing wires have been removed.  If he does not convert with amiodarone would consider DCCV after 3 weeks of anticoagulation.     Hypertension: BP is well controlled  Dyslipidemia: LDL 63 which is at goal of less than 70, continue high intensity statin with atorvastatin 80 mg daily  Tobacco abuse: Patient states that he has definitely now quit smoking  Patient will follow-up with our office in [place]. Appt has been arranged.     For questions or updates, please contact [facility] Please consult [email or web address] for contact info under Cardiology/STEMI.      Signed, Berton BonJanine Hammond, NP  03/16/2018, 8:17 AM     History and all data above reviewed.  Patient examined.  I agree with the findings as above.  He feels well and wants to go home.  The patient exam reveals ZOX:[id number]:Irregular  ,  Lungs: Decreased breath sound with bilateral crackles  ,  Abd: Positive bowel sounds, no rebound no guarding, Ext Mild edema  .  All available labs, radiology testing, previous records reviewed. Agree with documented assessment and plan. Atrial flutter:  It would be reasonable to pull his wires and start Eliquis and PO  amiodarone.  We could then see him next week in the Atrial fib clinic to arrange further management of his flutter.  We will contact surgical team to understand their preference for management.  Also, we will need to arrange a new patient appt in [place] with a new cardiologist as he lives in this area.    Fayrene FearingJames [facility]  10:37 AM  03/16/2018

## 2018-03-16 NOTE — Progress Notes (Signed)
CARDIAC REHAB PHASE I   PRE:  Rate/Rhythm: 74 aflutter    BP: sitting 115/69    SaO2: 89-90 RA  MODE:  Ambulation: 790 ft   POST:  Rate/Rhythm: 125 aflutter    BP: sitting 102/78     SaO2: 96 RA  Pt feeling better today. Able to ambulate long distance, steady with RW. HR more controlled, up to 120s aflutter. Discussed IS use, CRPII and smoking cessation. He feels confident about quitting smoking. Declined fake cigarette, I gave resources. [facility] CRPII. Pt wants to wait for his lady friend for education (tomorrow). [phone]   Harriet MassonRandi Kristan Deovion Batrez CES, ACSM 03/16/2018 10:09 AM

## 2018-03-16 NOTE — Progress Notes (Addendum)
[address]       [place] [address]             [phone]      4 Days Post-Op Procedure(s) (LRB): CORONARY ARTERY BYPASS GRAFTING (CABG) x 4, LIMA to LAD, SVG SEQUENTIALLY to DISTAL CIRCUMFLEX and RAMUS INTERMEDIATED, and SVG to DISTAL RCA,  USING LEFT INTERNAL MAMMARY ARTERY AND RIGHT GREATER SAPHENOUS VEIN HARVESTED ENDOSCOPICALLY (N/A) TRANSESOPHAGEAL ECHOCARDIOGRAM (TEE) (N/A) MEDIAN STERNOTOMY   Subjective:  No new complaints.  Continues to have some chest discomfort.  Is hesitant to walk with his HR.   Objective: Vital signs in last 24 hours: Temp:  [98 F (36.7 C)-98.9 F (37.2 C)] 98.6 F (37 C) (08/02 0749) Pulse Rate:  [75-104] 77 (08/02 0749) Cardiac Rhythm: Atrial fibrillation (08/02 0700) Resp:  [15-24] 18 (08/02 0749) BP: (96-141)/(50-81) 125/81 (08/02 0749) SpO2:  [93 %-98 %] 98 % (08/02 0749) Weight:  [206 lb 5.6 oz (93.6 kg)] 206 lb 5.6 oz (93.6 kg) (08/02 0430)  Intake/Output from previous day: 08/01 0701 - 08/02 0700 In: 1068.2 [P.O.:480; I.V.:588.2] Out: 2300 [Urine:2300]  General appearance: alert, cooperative and no distress Heart: regular rate and rhythm Lungs: clear to auscultation bilaterally Abdomen: soft, non-tender; bowel sounds normal; no masses,  no organomegaly Extremities: edema trace Wound: clean and dry  Lab Results: Recent Labs    03/14/18 0445 03/15/18 0419  WBC 11.2* 12.1*  HGB 11.6* 11.5*  HCT 36.5* 35.6*  PLT 105* 128*   BMET:  Recent Labs    03/14/18 0445 03/15/18 0419  NA 136 131*  K 4.1 3.7  CL 101 98  CO2 26 24  GLUCOSE 139* 132*  BUN 10 10  CREATININE 0.81 0.91  CALCIUM 8.3* 8.2*    PT/INR: No results for input(s): LABPROT, INR in the last 72 hours. ABG    Component Value Date/Time   PHART 7.370 03/12/2018 1801   HCO3 25.2 03/12/2018 1801   TCO2 23 03/13/2018 1710   ACIDBASEDEF 2.0 03/12/2018 1643   O2SAT 98.0 03/12/2018 1801   CBG (last 3)  Recent Labs     03/15/18 1644 03/15/18 2100 03/16/18 0600  GLUCAP 166* 127* 142*    Assessment/Plan: S/P Procedure(s) (LRB): CORONARY ARTERY BYPASS GRAFTING (CABG) x 4, LIMA to LAD, SVG SEQUENTIALLY to DISTAL CIRCUMFLEX and RAMUS INTERMEDIATED, and SVG to DISTAL RCA,  USING LEFT INTERNAL MAMMARY ARTERY AND RIGHT GREATER SAPHENOUS VEIN HARVESTED ENDOSCOPICALLY (N/A) TRANSESOPHAGEAL ECHOCARDIOGRAM (TEE) (N/A) MEDIAN STERNOTOMY  1.CV- A. Flutter, rate controlled- Dr. Tyrone Sage attempted to rapid A. Pace, but was unable to convert patient to NSR--- continue IV Amiodarone, Lopressor... He will likely require anticoagulation, however pacing wires would need to be removed prior to starting this 2.  Pulm- no acute issues, continue IS 3. Renal- creatinine has been WNL. Weight is trending down, continue gentle diuretics, K was a little low yesterday at 3.7, will supplement and repeat BMET in AM 4. DM-sugars controlled, continue current reigmen 5. Dispo- patient remains in rate controlled A. Flutter, have asked Cardiology to continue follow up, continue IV Amiodarone, Lopressor, will need anticoagulation if unable to convert to NSR, however pacing wires need removed prior to this.. Supplement K, repeat BMET in AM   LOS: 7 days    Ronald Stephens 03/16/2018 In flutter today Convert to po cordrone D/c wires, start anticoagulation as outlined by cardiology Home in am if stable,  I have seen and examined Ronald Stephens and agree with the above  assessment  and plan.  Ronald OvensEdward B Darneisha Windhorst MD [facility] [phone] Office [phone] 03/16/2018 1:19 PM

## 2018-03-16 NOTE — Discharge Instructions (Addendum)
Coronary Artery Bypass Grafting, Care After °These instructions give you information on caring for yourself after your procedure. Your doctor may also give you more specific instructions. Call your doctor if you have any problems or questions after your procedure. °Follow these instructions at home: °· Only take medicine as told by your doctor. Take medicines exactly as told. Do not stop taking medicines or start any new medicines without talking to your doctor first. °· Take your pulse as told by your doctor. °· Do deep breathing as told by your doctor. Use your breathing device (incentive spirometer), if given, to practice deep breathing several times a day. Support your chest with a pillow or your arms when you take deep breaths or cough. °· Keep the area clean, dry, and protected where the surgery cuts (incisions) were made. Remove bandages (dressings) only as told by your doctor. If strips were applied to surgical area, do not take them off. They fall off on their own. °· Check the surgery area daily for puffiness (swelling), redness, or leaking fluid. °· If surgery cuts were made in your legs: °? Avoid crossing your legs. °? Avoid sitting for long periods of time. Change positions every 30 minutes. °? Raise your legs when you are sitting. Place them on pillows. °· Wear stockings that help keep blood clots from forming in your legs (compression stockings). °· Only take sponge baths until your doctor says it is okay to take showers. Pat the surgery area dry. Do not rub the surgery area with a washcloth or towel. Do not bathe, swim, or use a hot tub until your doctor says it is okay. °· Eat foods that are high in fiber. These include raw fruits and vegetables, whole grains, beans, and nuts. Choose lean meats. Avoid canned, processed, and fried foods. °· Drink enough fluids to keep your pee (urine) clear or pale yellow. °· Weigh yourself every day. °· Rest and limit activity as told by your doctor. You may be told  to: °? Stop any activity if you have chest pain, shortness of breath, changes in heartbeat, or dizziness. Get help right away if this happens. °? Move around often for short amounts of time or take short walks as told by your doctor. Gradually become more active. You may need help to strengthen your muscles and build endurance. °? Avoid lifting, pushing, or pulling anything heavier than 10 pounds (4.5 kg) for at least 6 weeks after surgery. °· Do not drive until your doctor says it is okay. °· Ask your doctor when you can go back to work. °· Ask your doctor when you can begin sexual activity again. °· Follow up with your doctor as told. °Contact a doctor if: °· You have puffiness, redness, more pain, or fluid draining from the incision site. °· You have a fever. °· You have puffiness in your ankles or legs. °· You have pain in your legs. °· You gain 2 or more pounds (0.9 kg) a day. °· You feel sick to your stomach (nauseous) or throw up (vomit). °· You have watery poop (diarrhea). °Get help right away if: °· You have chest pain that goes to your jaw or arms. °· You have shortness of breath. °· You have a fast or irregular heartbeat. °· You notice a "clicking" in your breastbone when you move. °· You have numbness or weakness in your arms or legs. °· You feel dizzy or light-headed. °This information is not intended to replace advice given to you by   your health care provider. Make sure you discuss any questions you have with your health care provider. Document Released: 08/06/2013 Document Revised: 01/07/2016 Document Reviewed: 01/08/2013 Elsevier Interactive Patient Education  2017 [employer].   Information on my medicine - ELIQUIS (apixaban)  This medication education was reviewed with me or my healthcare representative as part of my discharge preparation.  Why was Eliquis prescribed for you? Eliquis was prescribed for you to reduce the risk of a blood clot forming that can cause a stroke if you have  a medical condition called atrial fibrillation (a type of irregular heartbeat).  What do You need to know about Eliquis ? Take your Eliquis TWICE DAILY - one tablet in the morning and one tablet in the evening with or without food. If you have difficulty swallowing the tablet whole please discuss with your pharmacist how to take the medication safely.  Take Eliquis exactly as prescribed by your doctor and DO NOT stop taking Eliquis without talking to the doctor who prescribed the medication.  Stopping may increase your risk of developing a stroke.  Refill your prescription before you run out.  After discharge, you should have regular check-up appointments with your healthcare provider that is prescribing your Eliquis.  In the future your dose may need to be changed if your kidney function or weight changes by a significant amount or as you get older.  What do you do if you miss a dose? If you miss a dose, take it as soon as you remember on the same day and resume taking twice daily.  Do not take more than one dose of ELIQUIS at the same time to make up a missed dose.  Important Safety Information A possible side effect of Eliquis is bleeding. You should call your healthcare provider right away if you experience any of the following: ? Bleeding from an injury or your nose that does not stop. ? Unusual colored urine (red or dark brown) or unusual colored stools (red or black). ? Unusual bruising for unknown reasons. ? A serious fall or if you hit your head (even if there is no bleeding).  Some medicines may interact with Eliquis and might increase your risk of bleeding or clotting while on Eliquis. To help avoid this, consult your healthcare provider or pharmacist prior to using any new prescription or non-prescription medications, including herbals, vitamins, non-steroidal anti-inflammatory drugs (NSAIDs) and supplements.  This website has more information on Eliquis (apixaban):  http://www.eliquis.com/eliquis/home    Diabetes Mellitus and Nutrition When you have diabetes (diabetes mellitus), it is very important to have healthy eating habits because your blood sugar (glucose) levels are greatly affected by what you eat and drink. Eating healthy foods in the appropriate amounts, at about the same times every day, can help you:  Control your blood glucose.  Lower your risk of heart disease.  Improve your blood pressure.  Reach or maintain a healthy weight.  Every person with diabetes is different, and each person has different needs for a meal plan. Your health care provider may recommend that you work with a diet and nutrition specialist (dietitian) to make a meal plan that is best for you. Your meal plan may vary depending on factors such as:  The calories you need.  The medicines you take.  Your weight.  Your blood glucose, blood pressure, and cholesterol levels.  Your activity level.  Other health conditions you have, such as heart or kidney disease.  How do carbohydrates affect me? Carbohydrates  affect your blood glucose level more than any other type of food. Eating carbohydrates naturally increases the amount of glucose in your blood. Carbohydrate counting is a method for keeping track of how many carbohydrates you eat. Counting carbohydrates is important to keep your blood glucose at a healthy level, especially if you use insulin or take certain oral diabetes medicines. It is important to know how many carbohydrates you can safely have in each meal. This is different for every person. Your dietitian can help you calculate how many carbohydrates you should have at each meal and for snack. Foods that contain carbohydrates include:  Bread, cereal, rice, pasta, and crackers.  Potatoes and corn.  Peas, beans, and lentils.  Milk and yogurt.  Fruit and juice.  Desserts, such as cakes, cookies, ice cream, and candy.  How does alcohol affect  me? Alcohol can cause a sudden decrease in blood glucose (hypoglycemia), especially if you use insulin or take certain oral diabetes medicines. Hypoglycemia can be a life-threatening condition. Symptoms of hypoglycemia (sleepiness, dizziness, and confusion) are similar to symptoms of having too much alcohol. If your health care provider says that alcohol is safe for you, follow these guidelines:  Limit alcohol intake to no more than 1 drink per day for nonpregnant women and 2 drinks per day for men. One drink equals 12 oz of beer, 5 oz of wine, or 1 oz of hard liquor.  Do not drink on an empty stomach.  Keep yourself hydrated with water, diet soda, or unsweetened iced tea.  Keep in mind that regular soda, juice, and other mixers may contain a lot of sugar and must be counted as carbohydrates.  What are tips for following this plan? Reading food labels  Start by checking the serving size on the label. The amount of calories, carbohydrates, fats, and other nutrients listed on the label are based on one serving of the food. Many foods contain more than one serving per package.  Check the total grams (g) of carbohydrates in one serving. You can calculate the number of servings of carbohydrates in one serving by dividing the total carbohydrates by 15. For example, if a food has 30 g of total carbohydrates, it would be equal to 2 servings of carbohydrates.  Check the number of grams (g) of saturated and trans fats in one serving. Choose foods that have low or no amount of these fats.  Check the number of milligrams (mg) of sodium in one serving. Most people should limit total sodium intake to less than 2,300 mg per day.  Always check the nutrition information of foods labeled as "low-fat" or "nonfat". These foods may be higher in added sugar or refined carbohydrates and should be avoided.  Talk to your dietitian to identify your daily goals for nutrients listed on the label. Shopping  Avoid  buying canned, premade, or processed foods. These foods tend to be high in fat, sodium, and added sugar.  Shop around the outside edge of the grocery store. This includes fresh fruits and vegetables, bulk grains, fresh meats, and fresh dairy. Cooking  Use low-heat cooking methods, such as baking, instead of high-heat cooking methods like deep frying.  Cook using healthy oils, such as olive, canola, or sunflower oil.  Avoid cooking with butter, cream, or high-fat meats. Meal planning  Eat meals and snacks regularly, preferably at the same times every day. Avoid going long periods of time without eating.  Eat foods high in fiber, such as fresh fruits, vegetables,  beans, and whole grains. Talk to your dietitian about how many servings of carbohydrates you can eat at each meal.  Eat 4-6 ounces of lean protein each day, such as lean meat, chicken, fish, eggs, or tofu. 1 ounce is equal to 1 ounce of meat, chicken, or fish, 1 egg, or 1/4 cup of tofu.  Eat some foods each day that contain healthy fats, such as avocado, nuts, seeds, and fish. Lifestyle   Check your blood glucose regularly.  Exercise at least 30 minutes 5 or more days each week, or as told by your health care provider.  Take medicines as told by your health care provider.  Do not use any products that contain nicotine or tobacco, such as cigarettes and e-cigarettes. If you need help quitting, ask your health care provider.  Work with a Veterinary surgeoncounselor or diabetes educator to identify strategies to manage stress and any emotional and social challenges. What are some questions to ask my health care provider?  Do I need to meet with a diabetes educator?  Do I need to meet with a dietitian?  What number can I call if I have questions?  When are the best times to check my blood glucose? Where to find more information:  American Diabetes Association: diabetes.org/food-and-fitness/food  Academy of Nutrition and Dietetics:  https://www.vargas.com/www.eatright.org/resources/health/diseases-and-conditions/diabetes  [employer] of Diabetes and Digestive and Kidney Diseases (NIH): FindJewelers.czwww.niddk.nih.gov/health-information/diabetes/overview/diet-eating-physical-activity Summary  A healthy meal plan will help you control your blood glucose and maintain a healthy lifestyle.  Working with a diet and nutrition specialist (dietitian) can help you make a meal plan that is best for you.  Keep in mind that carbohydrates and alcohol have immediate effects on your blood glucose levels. It is important to count carbohydrates and to use alcohol carefully. This information is not intended to replace advice given to you by your health care provider. Make sure you discuss any questions you have with your health care provider. Document Released: 04/28/2005 Document Revised: 09/05/2016 Document Reviewed: 09/05/2016 Elsevier Interactive Patient Education  [employer]2018 Elsevier Inc.

## 2018-03-16 NOTE — Progress Notes (Addendum)
ANTICOAGULATION CONSULT NOTE - Initial Consult  Pharmacy Consult for Apixaban Indication: atrial flutter  No Known Allergies  Patient Measurements: Height: 5' 10.6" (179.3 cm) Weight: 206 lb 5.6 oz (93.6 kg) IBW/kg (Calculated) : 74.38  Vital Signs: Temp: 99.1 F (37.3 C) (08/02 1253) Temp Source: Tympanic (08/02 1253) BP: 103/64 (08/02 1253) Pulse Rate: 69 (08/02 1253)  Labs: Recent Labs    03/13/18 1631 03/13/18 1710 03/14/18 0445 03/15/18 0419  HGB 12.3* 12.9* 11.6* 11.5*  HCT 38.3* 38.0* 36.5* 35.6*  PLT 111*  --  105* 128*  CREATININE 0.85 0.70 0.81 0.91    Estimated Creatinine Clearance: 104 mL/min (by C-G formula based on SCr of 0.91 mg/dL).   Medical History: Past Medical History:  Diagnosis Date  . Arthritis   . Bilateral ankle fractures 2014  . Diabetes mellitus   . Heart murmur    as child  . Hyperlipidemia   . Hypertension   . Right forearm fracture 2014  . Tobacco abuse     Medications:  Scheduled:  . amiodarone  400 mg Oral BID  . [START ON 03/17/2018] aspirin EC  81 mg Oral Daily  . atorvastatin  80 mg Oral Daily  . docusate sodium  200 mg Oral Daily  . insulin aspart  0-24 Units Subcutaneous TID AC & HS  . insulin detemir  10 Units Subcutaneous Daily  . lisinopril  2.5 mg Oral Daily  . mouth rinse  15 mL Mouth Rinse BID  . metFORMIN  1,000 mg Oral Q breakfast  . metoprolol tartrate  25 mg Oral BID  . pantoprazole  40 mg Oral QAC breakfast  . sodium chloride flush  3 mL Intravenous Q12H  . thiamine  100 mg Oral Daily    Assessment: Ronald Stephens is a 57 year old admitted for CABG. PMH siginificant for CAD, HLD, tobacco abuse, HTN, T2DM. Atrial flutter post-CAGB. Pharmacy consulted for apixaban. CBCs relatively stable.   Goal of Therapy:  Stroke prevention   Plan:  Apixaban 5mg  twice daily Apixaban to start after removal of pacing wires Apixaban first dose at 2200 on 8/2  Monitoring: s/sx bleeding, CBCs   Bradley FerrisJoshua Belva Koziel, PharmD PGY1  Pharmacy Resident Direct Phone: [phone] 03/16/2018  1:06 PM

## 2018-03-17 LAB — BASIC METABOLIC PANEL
Anion gap: 9 (ref 5–15)
BUN: 12 mg/dL (ref 6–20)
CHLORIDE: 97 mmol/L — AB (ref 98–111)
CO2: 27 mmol/L (ref 22–32)
CREATININE: 1.03 mg/dL (ref 0.61–1.24)
Calcium: 8.3 mg/dL — ABNORMAL LOW (ref 8.9–10.3)
GFR calc Af Amer: >60 mL/min (ref >60)
GLUCOSE: 146 mg/dL — AB (ref 70–99)
POTASSIUM: 3.8 mmol/L (ref 3.5–5.1)
Sodium: 133 mmol/L — ABNORMAL LOW (ref 135–145)

## 2018-03-17 LAB — GLUCOSE, CAPILLARY: Glucose-Capillary: 132 mg/dL — ABNORMAL HIGH (ref 70–99)

## 2018-03-17 MED ORDER — ACETAMINOPHEN 325 MG PO TABS: 650.0000 mg | ORAL_TABLET | Freq: Four times a day (QID) | ORAL | Status: DC | PRN

## 2018-03-17 MED ORDER — OXYCODONE HCL 5 MG PO TABS: 5.0000 mg | ORAL_TABLET | Freq: Four times a day (QID) | ORAL | 0 refills | Status: DC | PRN

## 2018-03-17 MED ORDER — LISINOPRIL 2.5 MG PO TABS: 2.5000 mg | ORAL_TABLET | Freq: Every day | ORAL | 1 refills | Status: DC

## 2018-03-17 MED ORDER — AMIODARONE HCL 200 MG PO TABS: ORAL_TABLET | ORAL | 1 refills | Status: DC

## 2018-03-17 MED ORDER — ATORVASTATIN CALCIUM 80 MG PO TABS: 80.0000 mg | ORAL_TABLET | Freq: Every day | ORAL | 1 refills | Status: DC

## 2018-03-17 MED ORDER — APIXABAN 5 MG PO TABS: 5.0000 mg | ORAL_TABLET | Freq: Two times a day (BID) | ORAL | 1 refills | Status: DC

## 2018-03-17 NOTE — Plan of Care (Signed)
Care plans reviewed and patient is progressing.  

## 2018-03-17 NOTE — Progress Notes (Signed)
CARDIAC REHAB PHASE I   1040-1130 Education completed with patient and significant other including nutrition, exercise guidelines, IS use, sternal precautions, and smoking cessation.  Handouts given to patient.  Pt ambulating in hallway this morning.      Nikki Domverett, Jourdyn Hasler G, RN 03/17/2018 1:08 PM

## 2018-03-17 NOTE — Care Management (Signed)
Pt given Eliquis cards.  Pt does not know what copay will be.  Copay card given to reduce copay.  Pt verbalizes understanding.

## 2018-03-17 NOTE — Plan of Care (Signed)
Patient is adequate for discharge.  

## 2018-03-17 NOTE — Progress Notes (Signed)
During 0000 round pt found w/ serosanguinous drain from lower end of midsternal incision after reaching over for something and pulling up w/ arms. Dressed w/ gauze. Educated pt on sternal precautions. Pt up for walk this am with wife, up and down the hall several times. Assisted back to bed. Will continue to monitor.  Margarito LinerStephanie M Teralyn Mullins, RN

## 2018-03-17 NOTE — Progress Notes (Signed)
      [address]       [place] [id number]             [phone]      5 Days Post-Op Procedure(s) (LRB): CORONARY ARTERY BYPASS GRAFTING (CABG) x 4, LIMA to LAD, SVG SEQUENTIALLY to DISTAL CIRCUMFLEX and RAMUS INTERMEDIATED, and SVG to DISTAL RCA,  USING LEFT INTERNAL MAMMARY ARTERY AND RIGHT GREATER SAPHENOUS VEIN HARVESTED ENDOSCOPICALLY (N/A) TRANSESOPHAGEAL ECHOCARDIOGRAM (TEE) (N/A) MEDIAN STERNOTOMY Subjective: He reached for a blood pressure cuff and in doing so some clear drainage seeped from the lower half of his incision.  Objective: Vital signs in last 24 hours: Temp:  [98.1 F (36.7 C)-99.1 F (37.3 C)] 98.1 F (36.7 C) (08/03 0500) Pulse Rate:  [69-100] 69 (08/03 0021) Cardiac Rhythm: Atrial flutter (08/03 0800) Resp:  [17-28] 28 (08/03 0500) BP: (103-129)/(64-79) 119/72 (08/03 0500) SpO2:  [93 %-96 %] 95 % (08/03 0500) Weight:  [92.5 kg (203 lb 14.4 oz)] 92.5 kg (203 lb 14.4 oz) (08/03 0612)    Intake/Output from previous day: 08/02 0701 - 08/03 0700 In: 90 [I.V.:90] Out: 1700 [Urine:1700] Intake/Output this shift: No intake/output data recorded.  General appearance: alert Heart: irregularly irregular rhythm Lungs: clear to auscultation bilaterally Abdomen: soft, non-tender; bowel sounds normal; no masses,  no organomegaly Extremities: extremities normal, atraumatic, no cyanosis or edema Wound: there is some clear drainage on his current dressing on the bottom half of his incision.  Lab Results: Recent Labs    03/15/18 0419  WBC 12.1*  HGB 11.5*  HCT 35.6*  PLT 128*   BMET:  Recent Labs    03/15/18 0419 03/17/18 0431  NA 131* 133*  K 3.7 3.8  CL 98 97*  CO2 24 27  GLUCOSE 132* 146*  BUN 10 12  CREATININE 0.91 1.03  CALCIUM 8.2* 8.3*    PT/INR: No results for input(s): LABPROT, INR in the last 72 hours. ABG    Component Value Date/Time   PHART 7.370 03/12/2018 1801   HCO3 25.2 03/12/2018 1801   TCO2 23 03/13/2018  1710   ACIDBASEDEF 2.0 03/12/2018 1643   O2SAT 98.0 03/12/2018 1801   CBG (last 3)  Recent Labs    03/16/18 1635 03/16/18 2118 03/17/18 0615  GLUCAP 130* 119* 132*    Assessment/Plan: S/P Procedure(s) (LRB): CORONARY ARTERY BYPASS GRAFTING (CABG) x 4, LIMA to LAD, SVG SEQUENTIALLY to DISTAL CIRCUMFLEX and RAMUS INTERMEDIATED, and SVG to DISTAL RCA,  USING LEFT INTERNAL MAMMARY ARTERY AND RIGHT GREATER SAPHENOUS VEIN HARVESTED ENDOSCOPICALLY (N/A) TRANSESOPHAGEAL ECHOCARDIOGRAM (TEE) (N/A) MEDIAN STERNOTOMY  1. CV- remains in rate-controlled atrial flutter. Wires pulled yesterday and started on Eliquis. Continue PO Amio. Continue ASA and statin therapy. Continue low-dose ACEI. BP well controlled 2. Pulm-tolerating room air with great oxygen saturation.  3. Renal-creatinine 1.03, electrolytes okay 4. H and H 11.5/35.6, platelets 128k 5. Blood glucose level is well controlled on his oral medication 6. Sternal drainage-appears to have been an isolated event. Dressing changed this morning. I cannot express further drainage. Incision appears stable and intact.   Plan: Discharge today home with family. Close cardiology appointment for a.flutter.     LOS: 8 days    Sharlene Doryessa N Railyn House 03/17/2018

## 2018-03-17 NOTE — Progress Notes (Signed)
Discharge instructions given to [name].  Discussed new medications, medication changes, and prescriptions.  Discussed follow up appointments and signs and symptoms to contact the physician for.  Verbalized understanding.

## 2018-03-23 ENCOUNTER — Encounter (HOSPITAL_COMMUNITY): Payer: Self-pay | Admitting: Nurse Practitioner

## 2018-03-23 ENCOUNTER — Ambulatory Visit (HOSPITAL_COMMUNITY)
Admission: RE | Admit: 2018-03-23 | Discharge: 2018-03-23 | Disposition: A | Discharge status: Home / Self Care | Payer: 59 | Source: Ambulatory Visit | Attending: Nurse Practitioner | Admitting: Nurse Practitioner

## 2018-03-23 VITALS — BP 122/76 | HR 58 | Ht 70.6 in | Wt 200.0 lb

## 2018-03-23 DIAGNOSIS — Z79899 Other long term (current) drug therapy: Secondary | ICD-10-CM | POA: Diagnosis not present

## 2018-03-23 DIAGNOSIS — I4892 Unspecified atrial flutter: Secondary | ICD-10-CM | POA: Diagnosis not present

## 2018-03-23 DIAGNOSIS — Z7901 Long term (current) use of anticoagulants: Secondary | ICD-10-CM | POA: Insufficient documentation

## 2018-03-23 DIAGNOSIS — Z7984 Long term (current) use of oral hypoglycemic drugs: Secondary | ICD-10-CM | POA: Insufficient documentation

## 2018-03-23 DIAGNOSIS — F17210 Nicotine dependence, cigarettes, uncomplicated: Secondary | ICD-10-CM | POA: Insufficient documentation

## 2018-03-23 DIAGNOSIS — E785 Hyperlipidemia, unspecified: Secondary | ICD-10-CM | POA: Diagnosis not present

## 2018-03-23 DIAGNOSIS — I10 Essential (primary) hypertension: Secondary | ICD-10-CM | POA: Diagnosis not present

## 2018-03-23 DIAGNOSIS — Z951 Presence of aortocoronary bypass graft: Secondary | ICD-10-CM | POA: Insufficient documentation

## 2018-03-23 DIAGNOSIS — I2510 Atherosclerotic heart disease of native coronary artery without angina pectoris: Secondary | ICD-10-CM | POA: Diagnosis not present

## 2018-03-23 DIAGNOSIS — Z7982 Long term (current) use of aspirin: Secondary | ICD-10-CM | POA: Insufficient documentation

## 2018-03-23 DIAGNOSIS — M1990 Unspecified osteoarthritis, unspecified site: Secondary | ICD-10-CM | POA: Diagnosis not present

## 2018-03-23 DIAGNOSIS — E119 Type 2 diabetes mellitus without complications: Secondary | ICD-10-CM | POA: Diagnosis not present

## 2018-03-23 NOTE — Progress Notes (Signed)
Primary Care Physician: Hannah Beat, MD Referring Physician: [facility] hospital f/u   Ronald Stephens is a 57 y.o. male with a h/o DM, HTN that underwent CABG x 4 on 03/12/18 and developed atrial flutter. He was started on amiodarone and eliquis and was scheduled for evaluation in the afib clinic to further assess rhythm to see if cardioversion was needed.  Pt is in SR today.At this point, he is on amiodarone 200 mg bid. He is taking eliquis 5 mg bid for a CHA2DS2VASc score of at least 3.  He feels well. Very little discomfort noted in sternum. He is getting in some walking without any shortness of breath. Sternum healing well.   Today, he denies symptoms of palpitations, chest pain, shortness of breath, orthopnea, PND, lower extremity edema, dizziness, presyncope, syncope, or neurologic sequela. The patient is tolerating medications without difficulties and is otherwise without complaint today.   Past Medical History:  Diagnosis Date  . Arthritis   . Bilateral ankle fractures 2014  . Diabetes mellitus   . Heart murmur    as child  . Hyperlipidemia   . Hypertension   . Right forearm fracture 2014  . Tobacco abuse    Past Surgical History:  Procedure Laterality Date  . CARDIAC CATHETERIZATION     as teen  . CORONARY ARTERY BYPASS GRAFT N/A 03/12/2018   Procedure: CORONARY ARTERY BYPASS GRAFTING (CABG) x 4, LIMA to LAD, SVG SEQUENTIALLY to DISTAL CIRCUMFLEX and RAMUS INTERMEDIATED, and SVG to DISTAL RCA,  USING LEFT INTERNAL MAMMARY ARTERY AND RIGHT GREATER SAPHENOUS VEIN HARVESTED ENDOSCOPICALLY;  Surgeon: Delight Ovens, MD;  Location: [facility] OR;  Service: Open Heart Surgery;  Laterality: N/A;  . LEFT HEART CATH AND CORONARY ANGIOGRAPHY N/A 03/09/2018   Procedure: LEFT HEART CATH AND CORONARY ANGIOGRAPHY;  Surgeon: Lyn Records, MD;  [facility]: [facility] INVASIVE CV LAB;  Service: Cardiovascular;  Laterality: N/A;  . MEDIASTERNOTOMY  03/12/2018   Procedure: MEDIAN STERNOTOMY;  Surgeon:  Delight Ovens, MD;  Location: [facility] OR;  Service: Open Heart Surgery;;  . TEE WITHOUT CARDIOVERSION N/A 03/12/2018   Procedure: TRANSESOPHAGEAL ECHOCARDIOGRAM (TEE);  Surgeon: Delight Ovens, MD;  Location: [facility] OR;  Service: Open Heart Surgery;  Laterality: N/A;    Current Outpatient Medications  Medication Sig Dispense Refill  . acetaminophen (TYLENOL) 325 MG tablet Take 2 tablets (650 mg total) by mouth every 6 (six) hours as needed for mild pain.    Marland Kitchen amiodarone (PACERONE) 200 MG tablet Please take 2 tabs (400mg ) twice a day for 4 days then take 1 tab (200mg ) twice a day until we see you in follow-up. 80 tablet 1  . apixaban (ELIQUIS) 5 MG TABS tablet Take 1 tablet (5 mg total) by mouth 2 (two) times daily. 60 tablet 1  . aspirin EC 81 MG tablet Take 1 tablet (81 mg total) by mouth daily. 90 tablet 3  . atorvastatin (LIPITOR) 80 MG tablet Take 1 tablet (80 mg total) by mouth daily. 30 tablet 1  . lisinopril (PRINIVIL,ZESTRIL) 2.5 MG tablet Take 1 tablet (2.5 mg total) by mouth daily. 30 tablet 1  . metFORMIN (GLUCOPHAGE-XR) 500 MG 24 hr tablet Take 2 tablets (1,000 mg total) by mouth daily with breakfast. 180 tablet 1  . metoprolol tartrate (LOPRESSOR) 25 MG tablet Take 1 tablet (25 mg total) by mouth 2 (two) times daily. 60 tablet 3  . omeprazole (PRILOSEC) 20 MG capsule TAKE ONE CAPSULE BY MOUTH TWICE A DAY 60 capsule 5  .  oxyCODONE (OXY IR/ROXICODONE) 5 MG immediate release tablet Take 1 tablet (5 mg total) by mouth every 6 (six) hours as needed for severe pain. 30 tablet 0   No current facility-administered medications for this encounter.     No Known Allergies  Social History   Socioeconomic History  . Marital status: Married    Spouse name: Not on file  . Number of children: Not on file  . Years of education: Not on file  . Highest education level: Not on file  Occupational History  . Occupation: Merchandiser, retail  Social Needs  . Financial resource strain: Not on file  .  Food insecurity:    Worry: Not on file    Inability: Not on file  . Transportation needs:    Medical: Not on file    Non-medical: Not on file  Tobacco Use  . Smoking status: Former Smoker    Packs/day: 0.50    Years: 30.00    Pack years: 15.00    Types: Cigarettes  . Smokeless tobacco: Current User    Last attempt to quit: 03/05/2014  Substance and Sexual Activity  . Alcohol use: Yes    Alcohol/week: 0.0 standard drinks    Comment: 2-3 beers per day  . Drug use: No  . Sexual activity: Not on file  Lifestyle  . Physical activity:    Days per week: Not on file    Minutes per session: Not on file  . Stress: Not on file  Relationships  . Social connections:    Talks on phone: Not on file    Gets together: Not on file    Attends religious service: Not on file    Active member of club or organization: Not on file    Attends meetings of clubs or organizations: Not on file    Relationship status: Not on file  . Intimate partner violence:    Fear of current or ex partner: Not on file    Emotionally abused: Not on file    Physically abused: Not on file    Forced sexual activity: Not on file  Other Topics Concern  . Not on file  Social History Narrative  . Not on file    Family History  Adopted: Yes  Problem Relation Age of Onset  . Colon cancer Neg Hx     ROS- All systems are reviewed and negative except as per the HPI above  Physical Exam: Vitals:   03/23/18 0859  BP: 122/76  Pulse: (!) 58  Weight: 90.7 kg  Height: 5' 10.6" (1.793 m)   Wt Readings from Last 3 Encounters:  03/23/18 90.7 kg  03/17/18 92.5 kg  03/07/18 94.3 kg    Labs: Lab Results  Component Value Date   NA 133 (L) 03/17/2018   K 3.8 03/17/2018   CL 97 (L) 03/17/2018   CO2 27 03/17/2018   GLUCOSE 146 (H) 03/17/2018   BUN 12 03/17/2018   CREATININE 1.03 03/17/2018   CALCIUM 8.3 (L) 03/17/2018   MG 2.4 03/13/2018   Lab Results  Component Value Date   INR 1.46 03/12/2018   Lab  Results  Component Value Date   CHOL 154 03/09/2018   HDL 27 (L) 03/09/2018   LDLCALC 63 03/09/2018   TRIG 318 (H) 03/09/2018     GEN- The patient is well appearing, alert and oriented x 3 today.   Head- normocephalic, atraumatic Eyes-  Sclera clear, conjunctiva pink Ears- hearing intact Oropharynx- clear Neck- supple, no JVP Lymph-  no cervical lymphadenopathy Lungs- Clear to ausculation bilaterally, normal work of breathing Heart- Regular rate and rhythm, no murmurs, rubs or gallops, PMI not laterally displaced Sternotomy healing well GI- soft, NT, ND, + BS Extremities- no clubbing, cyanosis, or edema MS- no significant deformity or atrophy Skin- no rash or lesion Psych- euthymic mood, full affect Neuro- strength and sensation are intact  EKG-Sinus brady at 58 bpm, pr int 272 ms, qrs int 112 ms, qtc 431 ms    Assessment and Plan: 1. CAD s/p 4 vessel bypass 7/25 Developed atrial flutter post procedure and is now in SR with amiodarone loading He is now on 200 mg bid and I would continue this dose at least  until he is seen by Dr. Wyline MoodBranch and can be considered if dose can be reduced to 200 mg daily at that time  Continue metoprolol 25 mg bid  Continue asa 81 mg bid  2. CHA2DS2VASc score of at least 2 Continue eliquis 5 mg bid Bleeding precautions discussed   3. HTN Stable   F/u with Dr. Wyline MoodBranch 8/19 and Dr. Tyrone SageGerhardt 9/9  Elvina Sidleonna C. Matthew Folksarroll, ANP-C [facility] [address] [place], [place] [address] [phone]

## 2018-03-30 ENCOUNTER — Ambulatory Visit: Payer: 59 | Admitting: Cardiology

## 2018-04-02 ENCOUNTER — Ambulatory Visit: Payer: 59 | Admitting: Cardiology

## 2018-04-02 ENCOUNTER — Encounter: Payer: Self-pay | Admitting: Cardiology

## 2018-04-02 VITALS — BP 109/72 | HR 68 | Ht 71.0 in | Wt 192.0 lb

## 2018-04-02 DIAGNOSIS — I2510 Atherosclerotic heart disease of native coronary artery without angina pectoris: Secondary | ICD-10-CM

## 2018-04-02 DIAGNOSIS — I4892 Unspecified atrial flutter: Secondary | ICD-10-CM | POA: Diagnosis not present

## 2018-04-02 MED ORDER — AMIODARONE HCL 200 MG PO TABS: 200.0000 mg | ORAL_TABLET | Freq: Every day | ORAL | 1 refills | Status: DC

## 2018-04-02 NOTE — Patient Instructions (Signed)
Medication Instructions:  Decrease amiodarone to 200 mg daily   Labwork: none  Testing/Procedures: none  Follow-Up: Your physician recommends that you schedule a follow-up appointment in: 4 months    Any Other Special Instructions Will Be Listed Below (If Applicable).     If you need a refill on your cardiac medications before your next appointment, please call your pharmacy.

## 2018-04-02 NOTE — Progress Notes (Signed)
Clinical Summary [name] is a 57 y.o.male seen today for hospital follow up of the following medical problems.   1. CAD - 02/2018 cath with ostail LAD 95%, D1 90%, ramus 90%, LCX 100%, RCA occldued - s/p CABG 03/12/18. LIMA-LAD, sequential SVG- LCX both mid and distal, SVG-RCA - 02/2018 echo LVEF 55-60%  \- no recent symptoms - compliant with meds - wants to wait on cardiac rehab  2. Atrial flutter - developed after CABG.  - started on anticoag, amiodarone, beta blocker. Has been on amio 200mg  bid since around Aug 3rd.  - had f/u in afib clinic 03/23/18, found to be in SR. ?amio dose change  - no recent palpitations - no bleeding on eliquis.     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 Arthritis   . Bilateral ankle fractures 2014  . Diabetes mellitus   . Heart murmur    as child  . Hyperlipidemia   . Hypertension   . Right forearm fracture 2014  . Tobacco abuse      No Known Allergies   Current Outpatient Medications  Medication Sig Dispense Refill  . acetaminophen (TYLENOL) 325 MG tablet Take 2 tablets (650 mg total) by mouth every 6 (six) hours as needed for mild pain.    Marland Kitchen. amiodarone (PACERONE) 200 MG tablet Please take 2 tabs (400mg ) twice a day for 4 days then take 1 tab (200mg ) twice a day until we see you in follow-up. 80 tablet 1  . apixaban (ELIQUIS) 5 MG TABS tablet Take 1 tablet (5 mg total) by mouth 2 (two) times daily. 60 tablet 1  . aspirin EC 81 MG tablet Take 1 tablet (81 mg total) by mouth daily. 90 tablet 3  . atorvastatin (LIPITOR) 80 MG tablet Take 1 tablet (80 mg total) by mouth daily. 30 tablet 1  . lisinopril (PRINIVIL,ZESTRIL) 2.5 MG tablet Take 1 tablet (2.5 mg total) by mouth daily. 30 tablet 1  . metFORMIN (GLUCOPHAGE-XR) 500 MG 24 hr tablet Take 2 tablets (1,000 mg total) by mouth daily with breakfast. 180 tablet 1  . metoprolol tartrate (LOPRESSOR) 25 MG tablet Take 1  tablet (25 mg total) by mouth 2 (two) times daily. 60 tablet 3  . omeprazole (PRILOSEC) 20 MG capsule TAKE ONE CAPSULE BY MOUTH TWICE A DAY 60 capsule 5  . oxyCODONE (OXY IR/ROXICODONE) 5 MG immediate release tablet Take 1 tablet (5 mg total) by mouth every 6 (six) hours as needed for severe pain. 30 tablet 0   No current facility-administered medications for this visit.      Past Surgical History:  Procedure Laterality Date  . CARDIAC CATHETERIZATION     as teen  . CORONARY ARTERY BYPASS GRAFT N/A 03/12/2018   Procedure: CORONARY ARTERY BYPASS GRAFTING (CABG) x 4, LIMA to LAD, SVG SEQUENTIALLY to DISTAL CIRCUMFLEX and RAMUS INTERMEDIATED, and SVG to DISTAL RCA,  USING LEFT INTERNAL MAMMARY ARTERY AND RIGHT GREATER SAPHENOUS VEIN HARVESTED ENDOSCOPICALLY;  Surgeon: Ronald OvensGerhardt, Edward B, MD;  [facility];  Service: Open Heart Surgery;  Laterality: N/A;  . LEFT HEART CATH AND CORONARY ANGIOGRAPHY N/A 03/09/2018   Procedure: LEFT HEART CATH AND CORONARY ANGIOGRAPHY;  Surgeon: Ronald RecordsSmith, Ronald W, MD;  Location: [facility] INVASIVE CV LAB;  Service: Cardiovascular;  Laterality: N/A;  . MEDIASTERNOTOMY  03/12/2018   Procedure: MEDIAN STERNOTOMY;  Surgeon: Ronald OvensGerhardt, Edward B, MD;  [facility];  Service: Open  Heart Surgery;;  . TEE WITHOUT CARDIOVERSION N/A 03/12/2018   Procedure: TRANSESOPHAGEAL ECHOCARDIOGRAM (TEE);  Surgeon: Ronald Ovens, MD;  Location: [facility] OR;  Service: Open Heart Surgery;  Laterality: N/A;     No Known Allergies    Family History  Adopted: Yes  Problem Relation Age of Onset  . Colon cancer Neg Hx      Social History Ronald Stephens reports that he has quit smoking. His smoking use included cigarettes. He has a 15.00 pack-year smoking history. He uses smokeless tobacco. Ronald Stephens reports that he drinks alcohol.   Review of Systems CONSTITUTIONAL: No weight loss, fever, chills, weakness or fatigue.  HEENT: Eyes: No visual loss, blurred vision, double  vision or yellow sclerae.No hearing loss, sneezing, congestion, runny nose or sore throat.  SKIN: No rash or itching.  CARDIOVASCULAR: per hpi RESPIRATORY: No shortness of breath, cough or sputum.  GASTROINTESTINAL: No anorexia, nausea, vomiting or diarrhea. No abdominal pain or blood.  GENITOURINARY: No burning on urination, no polyuria NEUROLOGICAL: No headache, dizziness, syncope, paralysis, ataxia, numbness or tingling in the extremities. No change in bowel or bladder control.  MUSCULOSKELETAL: No muscle, back pain, joint pain or stiffness.  LYMPHATICS: No enlarged nodes. No history of splenectomy.  PSYCHIATRIC: No history of depression or anxiety.  ENDOCRINOLOGIC: No reports of sweating, cold or heat intolerance. No polyuria or polydipsia.  Marland Kitchen   Physical Examination Vitals:   04/02/18 0956  BP: 109/72  Pulse: 68  SpO2: 95%   Vitals:   04/02/18 0956  Weight: 192 lb (87.1 kg)  Height: 5\' 11"  (1.803 m)    Gen: resting comfortably, no acute distress HEENT: no scleral icterus, pupils equal round and reactive, no palptable cervical adenopathy,  CV: RRR, no m/r/g, no jvd Resp: Clear to auscultation bilaterally GI: abdomen is soft, non-tender, non-distended, normal bowel sounds, no hepatosplenomegaly MSK: extremities are warm, no edema.  Skin: warm, no rash Neuro:  no focal deficits Psych: appropriate affect   Diagnostic Studies 02/2018 echo Study Conclusions  - Left ventricle: The cavity size was normal. Wall thickness was   increased in a pattern of mild LVH. Systolic function was normal.   The estimated ejection fraction was in the range of 55% to 60%.   Wall motion was normal; there were no regional wall motion   abnormalities. Doppler parameters are consistent with abnormal   left ventricular relaxation (grade 1 diastolic dysfunction).  Impressions:  - Normal LV systolic function; mild diastolic dysfunction; mild   LVH.  02/2018 cath  Severe three-vessel  coronary artery disease.  Moderate three-vessel coronary calcification.  Greater than 95% proximal LAD and 70% mid to distal.  First diagonal with multifocal 90% stenosis.  The LAD wraps around the left ventricular apex.  Ramus intermedius with proximal and mid 90% stenoses.  Circumflex is totally occluded after a large first obtuse marginal.  The obtuse marginal contains 70% small to mid stenosis.  Left to left collaterals to the occluded segment.  Dominant right coronary totally occluded proximal to mid segment with right to right and left to right collaterals.  Mild inferior hypokinesis.  EF 50 to 55%.  Normal LVEDP  Post cath RECOMMENDATIONS:   TCTS consultation to consider multivessel revascularization.  Resume IV heparin  Aggressive secondary risk factor modification: High intensity statin therapy, glycemic control, start low-dose beta-blocker therapy, aspirin, and should be hospitalized until revascularization can occur.    Assessment and Plan  1. CAD - recent CABG, recovering well - continue current  medical therapy, no symptoms  2. Aflutter - occurred post CABG. 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   F/u 4 months   Antoine PocheJonathan F. Branch, M.D.

## 2018-04-18 ENCOUNTER — Telehealth: Payer: Self-pay | Admitting: Cardiology

## 2018-04-18 ENCOUNTER — Telehealth: Payer: Self-pay

## 2018-04-18 NOTE — Telephone Encounter (Signed)
Patient contacted the office concerned because he cannot afford to pay for his Eliquis.  Patient was advised to contact his Cardiologist office for advise as to what he should do.  Patient acknowledged receipt.

## 2018-04-18 NOTE — Telephone Encounter (Signed)
Patient would like to discuss nurse regarding something chearper than Eliquis/tg

## 2018-04-19 MED ORDER — APIXABAN 5 MG PO TABS: 5.0000 mg | ORAL_TABLET | Freq: Two times a day (BID) | ORAL | 6 refills | Status: DC

## 2018-04-19 NOTE — Telephone Encounter (Addendum)
Pt has Nurse, learning disability, I refilled his eliquis and entered $10 co-pay info to pharmacy. I also sent pt email with info   RXBIN: [id number]  RXPCN: [id number] GRP: [id number] ID: [id number]

## 2018-04-20 ENCOUNTER — Telehealth: Payer: Self-pay

## 2018-04-20 ENCOUNTER — Other Ambulatory Visit: Payer: Self-pay | Admitting: Cardiothoracic Surgery

## 2018-04-20 DIAGNOSIS — Z951 Presence of aortocoronary bypass graft: Secondary | ICD-10-CM

## 2018-04-20 MED ORDER — RIVAROXABAN 20 MG PO TABS: 20.0000 mg | ORAL_TABLET | Freq: Every day | ORAL | 11 refills | Status: DC

## 2018-04-20 NOTE — Telephone Encounter (Signed)
Eliquis stopped as was not on insurance formulary with cost to pt $100/monthly  Start Xarelto 20 mg daily at dinner, pt got free 30 day trial today   [facility] them [facility]0 co-pay card to them and when pt can refill drug in 30 days, they will run commercial card for pt, pt aware

## 2018-04-23 ENCOUNTER — Ambulatory Visit
Admission: RE | Admit: 2018-04-23 | Discharge: 2018-04-23 | Disposition: A | Discharge status: Home / Self Care | Payer: 59 | Source: Ambulatory Visit | Attending: Cardiothoracic Surgery | Admitting: Cardiothoracic Surgery

## 2018-04-23 ENCOUNTER — Ambulatory Visit (INDEPENDENT_AMBULATORY_CARE_PROVIDER_SITE_OTHER): Payer: Self-pay | Admitting: Surgical

## 2018-04-23 ENCOUNTER — Other Ambulatory Visit: Payer: Self-pay

## 2018-04-23 VITALS — BP 116/77 | HR 69 | Resp 16 | Ht 71.0 in | Wt 192.0 lb

## 2018-04-23 DIAGNOSIS — I2510 Atherosclerotic heart disease of native coronary artery without angina pectoris: Secondary | ICD-10-CM

## 2018-04-23 DIAGNOSIS — Z951 Presence of aortocoronary bypass graft: Secondary | ICD-10-CM | POA: Diagnosis not present

## 2018-04-23 DIAGNOSIS — I214 Non-ST elevation (NSTEMI) myocardial infarction: Secondary | ICD-10-CM

## 2018-04-23 MED ORDER — TRAMADOL HCL 50 MG PO TABS: 50.0000 mg | ORAL_TABLET | Freq: Two times a day (BID) | ORAL | 0 refills | Status: AC | PRN

## 2018-04-23 NOTE — Patient Instructions (Signed)
Given verbal instructions regarding activity progression including lifting and driving. 

## 2018-04-23 NOTE — Progress Notes (Signed)
[address]       [place] [address]             [phone]      Hance Caspers [facility] Medical Record #[id number] Date of Birth: [birth year]  Referring: Baldo Daub, MD Primary Care: Hannah Beat, MD Primary Cardiologist: Dina Rich, MD   Chief Complaint:   POST OP FOLLOW UP OPERATIVE REPORT  DATE OF PROCEDURE:  03/13/2018  PREOPERATIVE DIAGNOSIS:  Recent non-STEMI myocardial infarction and 3-vessel coronary artery disease.  POSTOPERATIVE DIAGNOSIS:  Recent non-STEMI myocardial infarction and 3-vessel coronary artery disease.  SURGICAL PROCEDURE:  Coronary artery bypass grafting x4 with the left internal mammary to the left anterior descending coronary artery, sequential reverse saphenous vein graft to the intermediate and distal circumflex, reverse saphenous vein graft to the  distal right coronary artery with right greater saphenous thigh endovein harvesting.  SURGEON:  Gwenith Daily. Tyrone Sage, MD  FIRST ASSISTANT:  Lin Landsman, PA.  History of Present Illness:    Patient is a 57 year old male status post the above described procedure.  He is seen in the office on today's date and routine postsurgical follow-up after the above described procedure.  He reports that he does have some discomfort related to his incisions but overall is extremely pleased with his overall progress and has no other specific complaints.  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  He is tolerating this quite well.  He is anxious to start cardiac rehab.      Past Medical History:  Diagnosis Date  . Arthritis   . Bilateral ankle fractures 2014  . Diabetes mellitus   . Heart murmur    as child  . Hyperlipidemia   . Hypertension   . Right forearm fracture 2014  . Tobacco abuse      Social History   Tobacco Use  Smoking Status Former  Smoker  . Packs/day: 0.50  . Years: 30.00  . Pack years: 15.00  . Types: Cigarettes  Smokeless Tobacco Current User  . Last attempt to quit: 03/05/2014    Social History   Substance and Sexual Activity  Alcohol Use Yes  . Alcohol/week: 0.0 standard drinks   Comment: 2-3 beers per day     No Known Allergies  Current Outpatient Medications  Medication Sig Dispense Refill  . acetaminophen (TYLENOL) 325 MG tablet Take 2 tablets (650 mg total) by mouth every 6 (six) hours as needed for mild pain.    Marland Kitchen amiodarone (PACERONE) 200 MG tablet Take 1 tablet (200 mg total) by mouth daily. Please take 200 mg daily 90 tablet 1  . aspirin EC 81 MG tablet Take 1 tablet (81 mg total) by mouth daily. 90 tablet 3  . atorvastatin (LIPITOR) 80 MG tablet Take 1 tablet (80 mg total) by mouth daily. 30 tablet 1  . lisinopril (PRINIVIL,ZESTRIL) 2.5 MG tablet Take 1 tablet (2.5 mg total) by mouth daily. 30 tablet 1  . metFORMIN (GLUCOPHAGE-XR) 500 MG 24 hr tablet Take 2 tablets (1,000 mg total) by mouth daily with breakfast. 180 tablet 1  . metoprolol tartrate (LOPRESSOR) 25 MG tablet Take 1 tablet (25 mg total) by mouth 2 (two) times daily. 60 tablet 3  . omeprazole (PRILOSEC) 20 MG capsule TAKE ONE CAPSULE BY MOUTH TWICE A DAY 60 capsule 5  . oxyCODONE (OXY IR/ROXICODONE) 5 MG immediate  release tablet Take 1 tablet (5 mg total) by mouth every 6 (six) hours as needed for severe pain. 30 tablet 0  . rivaroxaban (XARELTO) 20 MG TABS tablet Take 1 tablet (20 mg total) by mouth daily with supper. 30 tablet 11   No current facility-administered medications for this visit.        Physical Exam: There were no vitals taken for this visit.  General appearance: alert, cooperative and no distress Heart: regular rate and rhythm and S1, S2 normal Lungs: clear to auscultation bilaterally Abdomen: soft, non-tender; bowel sounds normal; no masses,  no organomegaly Extremities: no edema Wound: Incisions  well-healed without evidence of infection.   Diagnostic Studies & Laboratory data:     Recent Radiology Findings:   Dg Chest 2 View  Result Date: 04/23/2018 CLINICAL DATA:  Post CABG. EXAM: CHEST - 2 VIEW COMPARISON:  03/15/2018 FINDINGS: Stable postsurgical changes of CABG. Cardiomediastinal silhouette is normal. Mediastinal contours appear intact. Calcific atherosclerotic disease of the aorta. There is no evidence of focal airspace consolidation, pleural effusion or pneumothorax. Diffuse coarsening of the interstitium. Osseous structures are without acute abnormality. Soft tissues are grossly normal. IMPRESSION: Diffuse coarsening of the interstitium which may be seen with smoking related lung changes. Calcific atherosclerotic disease of the aorta. Electronically Signed   By: Ted Mcalpine M.D.   On: 04/23/2018 12:37      Recent Lab Findings: Lab Results  Component Value Date   WBC 12.1 (H) 03/15/2018   HGB 11.5 (L) 03/15/2018   HCT 35.6 (L) 03/15/2018   PLT 128 (L) 03/15/2018   GLUCOSE 146 (H) 03/17/2018   CHOL 154 03/09/2018   TRIG 318 (H) 03/09/2018   HDL 27 (L) 03/09/2018   LDLDIRECT 174.5 06/05/2013   LDLCALC 63 03/09/2018   ALT 23 03/11/2018   AST 21 03/11/2018   NA 133 (L) 03/17/2018   K 3.8 03/17/2018   CL 97 (L) 03/17/2018   CREATININE 1.03 03/17/2018   BUN 12 03/17/2018   CO2 27 03/17/2018   TSH 2.50 10/08/2010   INR 1.46 03/12/2018   HGBA1C 8.0 (H) 03/11/2018      Assessment / Plan: Patient is doing quite well.  I have made no specific changes to his medications.  It is noted that he has been changed from Eliquis to Xarelto due to medication cost.  He has been followed up with cardiology.  He denies any palpitations and plan is to possibly discontinue amiodarone after next visit.  We had a long discussion about diabetes management as relates to heart disease.  He is very willing to make nutrition and lifestyle changes.  We will see again on a as needed basis  for any surgically related needs or at request.      I did give him a prescription for Ultram 50 mg every 12 hours as needed #14   Rowe Clack, PA-C 04/23/2018 12:51 PM  Pager [phone]

## 2018-05-11 ENCOUNTER — Other Ambulatory Visit: Payer: Self-pay | Admitting: Physician Assistant

## 2018-05-14 ENCOUNTER — Other Ambulatory Visit: Payer: Self-pay | Admitting: Cardiology

## 2018-06-02 ENCOUNTER — Other Ambulatory Visit: Payer: Self-pay | Admitting: Physician Assistant

## 2018-08-27 ENCOUNTER — Other Ambulatory Visit: Payer: Self-pay | Admitting: Family Medicine

## 2018-09-02 ENCOUNTER — Other Ambulatory Visit: Payer: Self-pay | Admitting: Family Medicine

## 2018-09-04 ENCOUNTER — Telehealth: Payer: Self-pay | Admitting: Family Medicine

## 2018-09-04 NOTE — Telephone Encounter (Signed)
Tried calling pt voice mail full  Please schedule appointment to follow up diabetes with Dr. Patsy Lager.   Thanks  [name]

## 2018-09-05 ENCOUNTER — Other Ambulatory Visit: Payer: Self-pay | Admitting: *Deleted

## 2018-09-05 MED ORDER — ATORVASTATIN CALCIUM 80 MG PO TABS: 80.0000 mg | ORAL_TABLET | Freq: Every day | ORAL | 1 refills | Status: DC

## 2018-09-10 ENCOUNTER — Telehealth: Payer: Self-pay | Admitting: *Deleted

## 2018-09-10 NOTE — Telephone Encounter (Signed)
*  STAT* If patient is at the pharmacy, call can be transferred to refill team.   1. Which medications need to be refilled? (please list name of each medication and dose if known) Metoprolol Tartrate 25 mg bid  2. Which pharmacy/location (including street and city if local pharmacy) [facility] to[facility] [place]   3. Do they need a 30 day or 90 day supply? 30

## 2018-09-10 NOTE — Progress Notes (Deleted)
Dr. Karleen Hampshire T. Jonnell Hentges, MD, CAQ Sports Medicine Primary Care and Sports Medicine [address] [place], [address] Phone: [phone] Fax: [phone]  09/12/2018  Patient: Ronald Stephens, MRN: [id number], DOB: 12-02-1960, 58 y.o.  Primary Physician:  Hannah Beat, MD   No chief complaint on file.  Subjective:   Ronald Stephens is a 30 y.o. very pleasant male patient who presents with the following:  03/08/2018 Admission with NSTEMI and ended up having CABG x 4.  Currently on Xarelto, ASA, Pacerone, Lipitor, Lisinopril, Lopressor.   Diabetes Mellitus: Tolerating Medications: yes Compliance with diet: fair Exercise: minimal / intermittent Avg blood sugars at home: not checking Foot problems: none Hypoglycemia: none No nausea, vomitting, blurred vision, polyuria.  Lab Results  Component Value Date   HGBA1C 8.0 (H) 03/11/2018   HGBA1C 8.4 (H) 02/28/2018   HGBA1C 6.7 (H) 06/05/2013   Lab Results  Component Value Date   MICROALBUR 1.0 03/22/2010   LDLCALC 63 03/09/2018   CREATININE 1.03 03/17/2018    Wt Readings from Last 3 Encounters:  04/23/18 192 lb (87.1 kg)  04/02/18 192 lb (87.1 kg)  03/23/18 200 lb (90.7 kg)    There is no height or weight on file to calculate BMI.   Lipids: Doing well, stable. Tolerating meds fine with no SE. Panel reviewed with patient.  Lipids:    Component Value Date/Time   CHOL 154 03/09/2018 0244   TRIG 318 (H) 03/09/2018 0244   HDL 27 (L) 03/09/2018 0244   LDLDIRECT 174.5 06/05/2013 1214   VLDL 64 (H) 03/09/2018 0244   CHOLHDL 5.7 03/09/2018 0244    Lab Results  Component Value Date   ALT 23 03/11/2018   AST 21 03/11/2018   ALKPHOS 52 03/11/2018   BILITOT 0.7 03/11/2018    HTN: Tolerating all medications without side effects Stable and at goal No CP, no sob. No HA.  BP Readings from Last 3 Encounters:  04/23/18 116/77  04/02/18 109/72  03/23/18 122/76    Basic Metabolic Panel:    Component  Value Date/Time   NA 133 (L) 03/17/2018 0431   K 3.8 03/17/2018 0431   CL 97 (L) 03/17/2018 0431   CO2 27 03/17/2018 0431   BUN 12 03/17/2018 0431   CREATININE 1.03 03/17/2018 0431   GLUCOSE 146 (H) 03/17/2018 0431   CALCIUM 8.3 (L) 03/17/2018 0431     Past Medical History, Surgical History, Social History, Family History, Problem List, Medications, and Allergies have been reviewed and updated if relevant.  Patient Active Problem List   Diagnosis Date Noted  . COPD, severe (HCC) 03/12/2018    Priority: High  . 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    Priority: High  . NSTEMI (non-ST elevated myocardial infarction) (HCC) 03/09/2018    Priority: High  . Controlled type 2 diabetes mellitus without complication, without long-term current use of insulin (HCC) 04/22/2009    Priority: High  . Hyperlipidemia associated with type 2 diabetes mellitus (HCC) 04/10/2009    Priority: High  . Essential hypertension 03/14/2009    Priority: High  . TOBACCO USE 03/13/2009    Priority: Medium  . Coronary artery disease 03/12/2018  . OSTEOARTHRITIS 03/14/2009    Past Medical History:  Diagnosis Date  . Arthritis   . Bilateral ankle fractures 2014  . Diabetes mellitus   . Heart murmur    as child  . Hyperlipidemia   . Hypertension   . Right forearm fracture 2014  . Tobacco abuse  Past Surgical History:  Procedure Laterality Date  . CARDIAC CATHETERIZATION     as teen  . CORONARY ARTERY BYPASS GRAFT N/A 03/12/2018   Procedure: CORONARY ARTERY BYPASS GRAFTING (CABG) x 4, LIMA to LAD, SVG SEQUENTIALLY to DISTAL CIRCUMFLEX and RAMUS INTERMEDIATED, and SVG to DISTAL RCA,  USING LEFT INTERNAL MAMMARY ARTERY AND RIGHT GREATER SAPHENOUS VEIN HARVESTED ENDOSCOPICALLY;  Surgeon: Delight OvensGerhardt, Edward B, MD;  [facility];  Service: Open Heart Surgery;  Laterality: N/A;  . LEFT HEART CATH AND CORONARY ANGIOGRAPHY N/A 03/09/2018   Procedure: LEFT HEART CATH AND CORONARY ANGIOGRAPHY;  Surgeon: Lyn RecordsSmith, Henry W,  MD;  [facility] INVASIVE [facility] LAB;  Service: Cardiovascular;  Laterality: N/A;  . MEDIASTERNOTOMY  03/12/2018   Procedure: MEDIAN STERNOTOMY;  Surgeon: Delight OvensGerhardt, Edward B, MD;  [facility];  Service: Open Heart Surgery;;  . TEE WITHOUT CARDIOVERSION N/A 03/12/2018   Procedure: TRANSESOPHAGEAL ECHOCARDIOGRAM (TEE);  Surgeon: Delight OvensGerhardt, Edward B, MD;  [facility];  Service: Open Heart Surgery;  Laterality: N/A;    Social History   Socioeconomic History  . Marital status: Married    Spouse name: Not on file  . Number of children: Not on file  . Years of education: Not on file  . Highest education level: Not on file  Occupational History  . Occupation: Merchandiser, retailsupervisor  Social Needs  . Financial resource strain: Not on file  . Food insecurity:    Worry: Not on file    Inability: Not on file  . Transportation needs:    Medical: Not on file    Non-medical: Not on file  Tobacco Use  . Smoking status: Former Smoker    Packs/day: 0.50    Years: 30.00    Pack years: 15.00    Types: Cigarettes  . Smokeless tobacco: Current User    Last attempt to quit: 03/05/2014  Substance and Sexual Activity  . Alcohol use: Yes    Alcohol/week: 0.0 standard drinks    Comment: 2-3 beers per day  . Drug use: No  . Sexual activity: Not on file  Lifestyle  . Physical activity:    Days per week: Not on file    Minutes per session: Not on file  . Stress: Not on file  Relationships  . Social connections:    Talks on phone: Not on file    Gets together: Not on file    Attends religious service: Not on file    Active member of club or organization: Not on file    Attends meetings of clubs or organizations: Not on file    Relationship status: Not on file  . Intimate partner violence:    Fear of current or ex partner: Not on file    Emotionally abused: Not on file    Physically abused: Not on file    Forced sexual activity: Not on file  Other Topics Concern  . Not on file  Social History  Narrative  . Not on file    Family History  Adopted: Yes  Problem Relation Age of Onset  . Colon cancer Neg Hx     No Known Allergies  Medication list reviewed and updated in full in [facility].   GEN: No acute illnesses, no fevers, chills. GI: No n/v/d, eating normally Pulm: No SOB Interactive and getting along well at home.  Otherwise, ROS is as per the HPI.  Objective:   There were no vitals taken for this visit.  GEN: WDWN, NAD, Non-toxic,  A & O x 3 HEENT: Atraumatic, Normocephalic. Neck supple. No masses, No LAD. Ears and Nose: No external deformity. CV: RRR, No M/G/R. No JVD. No thrill. No extra heart sounds. PULM: CTA B, no wheezes, crackles, rhonchi. No retractions. No resp. distress. No accessory muscle use. EXTR: No c/c/e NEURO Normal gait.  PSYCH: Normally interactive. Conversant. Not depressed or anxious appearing.  Calm demeanor.   Laboratory and Imaging Data:  Assessment and Plan:   ***

## 2018-09-10 NOTE — Telephone Encounter (Signed)
Appointment 1/29 °Pt aware °

## 2018-09-12 ENCOUNTER — Other Ambulatory Visit: Payer: Self-pay | Admitting: *Deleted

## 2018-09-12 ENCOUNTER — Ambulatory Visit: Payer: 59 | Admitting: Family Medicine

## 2018-09-12 DIAGNOSIS — Z0289 Encounter for other administrative examinations: Secondary | ICD-10-CM

## 2018-09-12 MED ORDER — METOPROLOL TARTRATE 25 MG PO TABS: 25.0000 mg | ORAL_TABLET | Freq: Two times a day (BID) | ORAL | 0 refills | Status: DC

## 2018-10-08 ENCOUNTER — Other Ambulatory Visit: Payer: Self-pay

## 2018-10-08 NOTE — Telephone Encounter (Signed)
Refilled 15 day supply lopressor to pharmacy in [place] , pt already given 30 day supply, has not made fu apt.

## 2018-10-10 NOTE — Progress Notes (Signed)
Dr. Karleen Hampshire T. Merland Holness, MD, CAQ Sports Medicine Primary Care and Sports Medicine [address] [place], [address] Phone: [phone] Fax: [phone]  10/11/2018  Patient: Ronald Stephens, MRN: [id number], DOB: August 08, 1961, 58 y.o.  Primary Physician:  Hannah Beat, MD   Chief Complaint  Patient presents with  . Discuss medication   Subjective:   Ronald Stephens is a 58 y.o. very pleasant male patient who presents with the following:  Patient comes in s/p CABG last year and NSTEMI to discuss meds. I have not seen him since his CABG.  He has not followed up with Cardiology since 04/2018, and his CABG was on 03/13/2018.  He is feeling remarkably better since his CABG.  Diabetes Mellitus: Tolerating Medications: yes Compliance with diet: fair Exercise: minimal / intermittent Avg blood sugars at home: not checking Foot problems: none Hypoglycemia: none No nausea, vomitting, blurred vision, polyuria.  Lab Results  Component Value Date   HGBA1C 6.7 (A) 10/11/2018   HGBA1C 8.0 (H) 03/11/2018   HGBA1C 8.4 (H) 02/28/2018   Lab Results  Component Value Date   MICROALBUR 1.0 03/22/2010   LDLCALC 63 03/09/2018   CREATININE 1.03 03/17/2018    Wt Readings from Last 3 Encounters:  10/11/18 213 lb 8 oz (96.8 kg)  04/23/18 192 lb (87.1 kg)  04/02/18 192 lb (87.1 kg)    Body mass index is 30.2 kg/m.   HTN: From a blood pressure standpoint, it is quite elevated today, and he is completely out of his lisinopril.  He tells me that he has been checking it regularly every time he goes to the pharmacy and store, and it has been completely normal then.  He is also taken some cold medicine. Stable and at goal No CP, no sob. No HA.  BP Readings from Last 3 Encounters:  10/11/18 (!) 177/98  04/23/18 116/77  04/02/18 109/72    Basic Metabolic Panel:    Component Value Date/Time   NA 133 (L) 03/17/2018 0431   K 3.8 03/17/2018 0431   CL 97 (L) 03/17/2018 0431     CO2 27 03/17/2018 0431   BUN 12 03/17/2018 0431   CREATININE 1.03 03/17/2018 0431   GLUCOSE 146 (H) 03/17/2018 0431   CALCIUM 8.3 (L) 03/17/2018 0431    No f/u lipids or other labs  Working in [employer] smoking entirely.  Sex drive is decreased. Able to get an erection but does not want to at all - causing problems with his girlfriend This is been an ongoing problem, and he noticed that it has been worse since his CABG.  Questions whether 1 of his medications could be doing it.  Took metoprolol this AM Taking ASA  160/80 this morning  a1c 6.7  F/u with Dr. Wyline Mood with Cardiology -   ? Sleep study and OSA question.  He also questions whether or not he could have sleep apnea.  His girlfriend has sleep apnea and she is witnessed him having true apneic events.  He also feels tired in the morning most of the time.  [place] -   Past Medical History, Surgical History, Social History, Family History, Problem List, Medications, and Allergies have been reviewed and updated if relevant.  Patient Active Problem List   Diagnosis Date Noted  . COPD, severe (HCC) 03/12/2018    Priority: High  . 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    Priority: High  . NSTEMI (non-ST elevated myocardial infarction) (HCC) 03/09/2018    Priority: High  .  Controlled type 2 diabetes mellitus without complication, without long-term current use of insulin (HCC) 04/22/2009    Priority: High  . Hyperlipidemia associated with type 2 diabetes mellitus (HCC) 04/10/2009    Priority: High  . Essential hypertension 03/14/2009    Priority: High  . TOBACCO USE 03/13/2009    Priority: Medium  . Coronary artery disease 03/12/2018  . OSTEOARTHRITIS 03/14/2009    Past Medical History:  Diagnosis Date  . Arthritis   . Bilateral ankle fractures 2014  . Diabetes mellitus   . Heart murmur    as child  . Hyperlipidemia   . Hypertension   . Right forearm fracture 2014  . Tobacco abuse     Past Surgical History:   Procedure Laterality Date  . CARDIAC CATHETERIZATION     as teen  . CORONARY ARTERY BYPASS GRAFT N/A 03/12/2018   Procedure: CORONARY ARTERY BYPASS GRAFTING (CABG) x 4, LIMA to LAD, SVG SEQUENTIALLY to DISTAL CIRCUMFLEX and RAMUS INTERMEDIATED, and SVG to DISTAL RCA,  USING LEFT INTERNAL MAMMARY ARTERY AND RIGHT GREATER SAPHENOUS VEIN HARVESTED ENDOSCOPICALLY;  Surgeon: Delight Ovens, MD;  Location[facility];  Service: Open Heart Surgery;  Laterality: N/A;  . LEFT HEART CATH AND CORONARY ANGIOGRAPHY N/A 03/09/2018   Procedure: LEFT HEART CATH AND CORONARY ANGIOGRAPHY;  Surgeon: Lyn Records, MD;  [facility]: [facility] INVASIVE [facility] LAB;  Service: Cardiovascular;  Laterality: N/A;  . MEDIASTERNOTOMY  03/12/2018   Procedure: MEDIAN STERNOTOMY;  Surgeon: Delight Ovens, MD;  Location: [facility] OR;  Service: Open Heart Surgery;;  . TEE WITHOUT CARDIOVERSION N/A 03/12/2018   Procedure: TRANSESOPHAGEAL ECHOCARDIOGRAM (TEE);  Surgeon: Delight Ovens, MD;  [facility];  Service: Open Heart Surgery;  Laterality: N/A;    Social History   Socioeconomic History  . Marital status: Married    Spouse name: Not on file  . Number of children: Not on file  . Years of education: Not on file  . Highest education level: Not on file  Occupational History  . Occupation: Merchandiser, retail  Social Needs  . Financial resource strain: Not on file  . Food insecurity:    Worry: Not on file    Inability: Not on file  . Transportation needs:    Medical: Not on file    Non-medical: Not on file  Tobacco Use  . Smoking status: Former Smoker    Packs/day: 0.50    Years: 30.00    Pack years: 15.00    Types: Cigarettes  . Smokeless tobacco: Current User    Last attempt to quit: 03/05/2014  Substance and Sexual Activity  . Alcohol use: Yes    Alcohol/week: 0.0 standard drinks    Comment: 2-3 beers per day  . Drug use: No  . Sexual activity: Not on file  Lifestyle  . Physical activity:    Days per week: Not on file     Minutes per session: Not on file  . Stress: Not on file  Relationships  . Social connections:    Talks on phone: Not on file    Gets together: Not on file    Attends religious service: Not on file    Active member of club or organization: Not on file    Attends meetings of clubs or organizations: Not on file    Relationship status: Not on file  . Intimate partner violence:    Fear of current or ex partner: Not on file    Emotionally abused: Not on file  Physically abused: Not on file    Forced sexual activity: Not on file  Other Topics Concern  . Not on file  Social History Narrative  . Not on file    Family History  Adopted: Yes  Problem Relation Age of Onset  . Colon cancer Neg Hx     No Known Allergies  Medication list reviewed and updated in full in [facility].   GEN: No acute illnesses, no fevers, chills. GI: No n/v/d, eating normally Pulm: No SOB Interactive and getting along well at home.  Otherwise, ROS is as per the HPI.  Objective:   BP (!) 177/98   Pulse 66   Temp 97.7 F (36.5 C) (Oral)   Ht 5' 10.5" (1.791 m)   Wt 213 lb 8 oz (96.8 kg)   SpO2 98%   BMI 30.20 kg/m   GEN: WDWN, NAD, Non-toxic, A & O x 3 HEENT: Atraumatic, Normocephalic. Neck supple. No masses, No LAD. Ears and Nose: No external deformity. CV: RRR, No M/G/R. No JVD. No thrill. No extra heart sounds. PULM: CTA B, no wheezes, crackles, rhonchi. No retractions. No resp. distress. No accessory muscle use. EXTR: No c/c/e NEURO Normal gait.  PSYCH: Normally interactive. Conversant. Not depressed or anxious appearing.  Calm demeanor.   Laboratory and Imaging Data:  Assessment and Plan:   S/P CABG x 4  NSTEMI (non-ST elevated myocardial infarction) (HCC)  COPD, severe (HCC)  Controlled type 2 diabetes mellitus without complication, without long-term current use of insulin (HCC) - Plan: POCT glycosylated hemoglobin (Hb A1C)  Essential hypertension  Hyperlipidemia  associated with type 2 diabetes mellitus (HCC)  OSA (obstructive sleep apnea) - Plan: Ambulatory referral to Pulmonology  Other fatigue  Decreased libido  He is overdue for follow-up with cardiology, and I am going to send his cardiologist and his assistant a note.  He is in sinus rhythm today - he stopped amiodarone on his own, and I do not know how to deal with this.  Diabetes is stable continue metformin.  Blood pressure is high today, but out of lisinopril and taking cold medicine.  Question compliance with metoprolol use.  His history is that it is completely normal when he is checking it, and all prior readings recently have been completely normal, so I am only get a refill his lisinopril.  I have asked him to continue to check it, and contact me if it is elevated.  He needs repeat laboratories, he is well overdue.  He has some concern about his fatigue and decreased libido.  I think that it is reasonable to get a testosterone level, but it is not an appropriate time now.  Explained to him that testosterone needs to be checked between 8 and 10 in the morning in a fasting state.  To fully diagnose low testosterone, to independent readings in a fasting state from 8-10 in the morning are needed.  I have given a written script to have labs drawn at [facility] near his job, early AM fasting.  Question sleep apnea, consult sleep medicine for evaluation.  Follow-up: 6 mo  Meds ordered this encounter  Medications  . lisinopril (PRINIVIL,ZESTRIL) 2.5 MG tablet    Sig: Take 1 tablet (2.5 mg total) by mouth daily.    Dispense:  90 tablet    Refill:  3  . metFORMIN (GLUCOPHAGE-XR) 500 MG 24 hr tablet    Sig: TAKE 2 TABLETS BY MOUTH EVERY DAY WITH BREAKFAST    Dispense:  180  tablet    Refill:  3  . NONFORMULARY OR COMPOUNDED ITEM    Sig: Epic Account: [id number] Lab Studies: BMP, CBC with diff, HFP: Z79.899 FLP: E78.5 PSA: total and free: Z12.5  Urine microalbumin, E11.9 diabetes mellitus  type 2 Hepatitis C: Z11.59 screening for hepatitis c Total and Free testosterone: E29.1  Re: hypogonadism and fatigue    Dispense:  1 each    Refill:  0   Orders Placed This Encounter  Procedures  . Ambulatory referral to Pulmonology  . POCT glycosylated hemoglobin (Hb A1C)    Signed,  Jerica Creegan T. Kyrah Schiro, MD   Outpatient Encounter Medications as of 10/11/2018  Medication Sig  . acetaminophen (TYLENOL) 325 MG tablet Take 2 tablets (650 mg total) by mouth every 6 (six) hours as needed for mild pain.  Marland Kitchen aspirin EC 81 MG tablet Take 1 tablet (81 mg total) by mouth daily.  Marland Kitchen atorvastatin (LIPITOR) 80 MG tablet Take 1 tablet (80 mg total) by mouth daily.  . metFORMIN (GLUCOPHAGE-XR) 500 MG 24 hr tablet TAKE 2 TABLETS BY MOUTH EVERY DAY WITH BREAKFAST  . metoprolol tartrate (LOPRESSOR) 25 MG tablet Take 1 tablet (25 mg total) by mouth 2 (two) times daily.  Marland Kitchen omeprazole (PRILOSEC) 20 MG capsule TAKE ONE CAPSULE BY MOUTH TWICE A DAY  . rivaroxaban (XARELTO) 20 MG TABS tablet Take 1 tablet (20 mg total) by mouth daily with supper.  . [DISCONTINUED] metFORMIN (GLUCOPHAGE-XR) 500 MG 24 hr tablet TAKE 2 TABLETS BY MOUTH EVERY DAY WITH BREAKFAST  . amiodarone (PACERONE) 200 MG tablet Take 1 tablet (200 mg total) by mouth daily. Please take 200 mg daily (Patient not taking: Reported on 10/11/2018)  . lisinopril (PRINIVIL,ZESTRIL) 2.5 MG tablet Take 1 tablet (2.5 mg total) by mouth daily.  . NONFORMULARY OR COMPOUNDED ITEM Epic Account: [id number] Lab Studies: BMP, CBC with diff, HFP: Z79.899 FLP: E78.5 PSA: total and free: Z12.5  Urine microalbumin, E11.9 diabetes mellitus type 2 Hepatitis C: Z11.59 screening for hepatitis c Total and Free testosterone: E29.1  Re: hypogonadism and fatigue  . [DISCONTINUED] lisinopril (PRINIVIL,ZESTRIL) 2.5 MG tablet Take 1 tablet (2.5 mg total) by mouth daily. (Patient not taking: Reported on 10/11/2018)   No facility-administered encounter medications on file as  of 10/11/2018.

## 2018-10-11 ENCOUNTER — Ambulatory Visit: Payer: 59 | Admitting: Family Medicine

## 2018-10-11 VITALS — BP 177/98 | HR 66 | Temp 97.7°F | Ht 70.5 in | Wt 213.5 lb

## 2018-10-11 DIAGNOSIS — E785 Hyperlipidemia, unspecified: Secondary | ICD-10-CM

## 2018-10-11 DIAGNOSIS — G4733 Obstructive sleep apnea (adult) (pediatric): Secondary | ICD-10-CM

## 2018-10-11 DIAGNOSIS — I214 Non-ST elevation (NSTEMI) myocardial infarction: Secondary | ICD-10-CM

## 2018-10-11 DIAGNOSIS — Z951 Presence of aortocoronary bypass graft: Secondary | ICD-10-CM

## 2018-10-11 DIAGNOSIS — R5383 Other fatigue: Secondary | ICD-10-CM

## 2018-10-11 DIAGNOSIS — R6882 Decreased libido: Secondary | ICD-10-CM

## 2018-10-11 DIAGNOSIS — I10 Essential (primary) hypertension: Secondary | ICD-10-CM

## 2018-10-11 DIAGNOSIS — J449 Chronic obstructive pulmonary disease, unspecified: Secondary | ICD-10-CM

## 2018-10-11 DIAGNOSIS — E1169 Type 2 diabetes mellitus with other specified complication: Secondary | ICD-10-CM

## 2018-10-11 DIAGNOSIS — E119 Type 2 diabetes mellitus without complications: Secondary | ICD-10-CM | POA: Diagnosis not present

## 2018-10-11 LAB — POCT GLYCOSYLATED HEMOGLOBIN (HGB A1C): Hemoglobin A1C: 6.7 % — AB (ref 4.0–5.6)

## 2018-10-11 MED ORDER — NONFORMULARY OR COMPOUNDED ITEM: 0 refills | Status: DC

## 2018-10-11 MED ORDER — METFORMIN HCL ER 500 MG PO TB24: ORAL_TABLET | ORAL | 3 refills | Status: DC

## 2018-10-11 MED ORDER — LISINOPRIL 2.5 MG PO TABS: 2.5000 mg | ORAL_TABLET | Freq: Every day | ORAL | 3 refills | Status: DC

## 2018-10-12 ENCOUNTER — Encounter: Payer: Self-pay | Admitting: Family Medicine

## 2018-10-17 ENCOUNTER — Other Ambulatory Visit: Payer: Self-pay | Admitting: *Deleted

## 2018-10-17 MED ORDER — METOPROLOL TARTRATE 25 MG PO TABS: 25.0000 mg | ORAL_TABLET | Freq: Two times a day (BID) | ORAL | 6 refills | Status: DC

## 2018-10-19 ENCOUNTER — Telehealth: Payer: Self-pay

## 2018-10-19 NOTE — Telephone Encounter (Signed)
Called patient to discuss his referral. No answer and voicemail is full.

## 2018-11-07 ENCOUNTER — Telehealth: Payer: Self-pay

## 2018-11-07 NOTE — Telephone Encounter (Signed)
-----   Message from Antoine Poche, MD sent at 11/07/2018  8:49 AM EDT ----- Patient scheduled for tomorrow who is ok to reschedule 2 months if no problems. I had read from his pcp note he stopped his amiodarone which is ok, we can officially stop his amiodarone and remove from his list.     Dominga Ferry MD

## 2018-11-07 NOTE — Telephone Encounter (Signed)
Pt will reschedule in 2 months .

## 2018-11-08 ENCOUNTER — Ambulatory Visit: Payer: 59 | Admitting: Cardiology

## 2018-11-16 ENCOUNTER — Institutional Professional Consult (permissible substitution): Payer: 59 | Admitting: Pulmonary Disease

## 2019-02-01 ENCOUNTER — Telehealth: Payer: Self-pay | Admitting: Cardiology

## 2019-02-01 NOTE — Telephone Encounter (Signed)
Virtual Visit Pre-Appointment Phone Call  "(Name), I am calling you today to discuss your upcoming appointment. We are currently trying to limit exposure to the virus that causes COVID-19 by seeing patients at home rather than in the office."  1. "What is the BEST phone number to call the day of the visit?" - include this in appointment notes  2. Do you have or have access to (through a family member/friend) a smartphone with video capability that we can use for your visit?" a. If yes - list this number in appt notes as cell (if different from BEST phone #) and list the appointment type as a VIDEO visit in appointment notes b. If no - list the appointment type as a PHONE visit in appointment notes  3. Confirm consent - "In the setting of the current Covid19 crisis, you are scheduled for a (phone or video) visit with your provider on (date) at (time).  Just as we do with many in-office visits, in order for you to participate in this visit, we must obtain consent.  If you'd like, I can send this to your mychart (if signed up) or email for you to review.  Otherwise, I can obtain your verbal consent now.  All virtual visits are billed to your insurance company just like a normal visit would be.  By agreeing to a virtual visit, we'd like you to understand that the technology does not allow for your provider to perform an examination, and thus may limit your provider's ability to fully assess your condition. If your provider identifies any concerns that need to be evaluated in person, we will make arrangements to do so.  Finally, though the technology is pretty good, we cannot assure that it will always work on either your or our end, and in the setting of a video visit, we may have to convert it to a phone-only visit.  In either situation, we cannot ensure that we have a secure connection.  Are you willing to proceed?" STAFF: Did the patient verbally acknowledge consent to telehealth visit? Document  YES/NO here: Yes  4. Advise patient to be prepared - "Two hours prior to your appointment, go ahead and check your blood pressure, pulse, oxygen saturation, and your weight (if you have the equipment to check those) and write them all down. When your visit starts, your provider will ask you for this information. If you have an Apple Watch or Kardia device, please plan to have heart rate information ready on the day of your appointment. Please have a pen and paper handy nearby the day of the visit as well."  5. Give patient instructions for MyChart download to smartphone OR [email or web address] as below if video visit (depending on what platform provider is using)  6. Inform patient they will receive a phone call 15 minutes prior to their appointment time (may be from unknown caller ID) so they should be prepared to answer    TELEPHONE CALL NOTE  Pat PatrickJose Esteves-Herrera has been deemed a candidate for a follow-up tele-health visit to limit community exposure during the Covid-19 pandemic. I spoke with the patient via phone to ensure availability of phone/video source, confirm preferred email & phone number, and discuss instructions and expectations.  I reminded Pat PatrickJose Esteves-Herrera to be prepared with any vital sign and/or heart rhythm information that could potentially be obtained via home monitoring, at the time of his visit. I reminded Pat PatrickJose Esteves-Herrera to expect a phone call prior to his visit.  Orinda Kenner 02/01/2019 12:51 PM

## 2019-02-05 ENCOUNTER — Telehealth (INDEPENDENT_AMBULATORY_CARE_PROVIDER_SITE_OTHER): Payer: 59 | Admitting: Cardiology

## 2019-02-05 ENCOUNTER — Other Ambulatory Visit: Payer: Self-pay

## 2019-02-05 DIAGNOSIS — E1169 Type 2 diabetes mellitus with other specified complication: Secondary | ICD-10-CM

## 2019-02-05 DIAGNOSIS — I10 Essential (primary) hypertension: Secondary | ICD-10-CM

## 2019-02-05 DIAGNOSIS — I4892 Unspecified atrial flutter: Secondary | ICD-10-CM

## 2019-02-05 DIAGNOSIS — I2510 Atherosclerotic heart disease of native coronary artery without angina pectoris: Secondary | ICD-10-CM

## 2019-02-05 DIAGNOSIS — E785 Hyperlipidemia, unspecified: Secondary | ICD-10-CM

## 2019-02-05 NOTE — Addendum Note (Signed)
Addended by: Debbora Lacrosse R on: 02/05/2019 09:24 AM   Modules accepted: Orders

## 2019-02-05 NOTE — Progress Notes (Signed)
Virtual Visit via Telephone Note   This visit type was conducted due to national recommendations for restrictions regarding the COVID-19 Pandemic (e.g. social distancing) in an effort to limit this patient's exposure and mitigate transmission in our community.  Due to his co-morbid illnesses, this patient is at least at moderate risk for complications without adequate follow up.  This format is felt to be most appropriate for this patient at this time.  The patient did not have access to video technology/had technical difficulties with video requiring transitioning to audio format only (telephone).  All issues noted in this document were discussed and addressed.  No physical exam could be performed with this format.  Please refer to the patient's chart for his  consent to telehealth for [facility].   Date:  02/05/2019   ID:  Ronald Stephens, DOB 05/18/1961, MRN [id number]  Patient Location: Home Provider Location: Office  PCP:  Hannah Beatopland, Spencer, MD  Cardiologist:  Dina RichBranch, Delmos Velaquez, MD  Electrophysiologist:  None   Evaluation Performed:  Follow-Up Visit  Chief Complaint:  Follow up visit   History of Present Illness:    Ronald Stephens is a 58 y.o. male seen today for follow up of the following medical problems;   1. CAD - 02/2018 cath with ostail LAD 95%, D1 90%, ramus 90%, LCX 100%, RCA occldued - s/p CABG 03/13/18. LIMA-LAD, sequential SVG- LCX both mid and distal, SVG-RCA - 02/2018 echo LVEF 55-60%   - no recent chest pain. No SOB or DOE - compliant with meds  2. Atrial flutter - developed after CABG.  - started on anticoag, amiodarone, beta blocker. Has been on amio 200mg  bid since around Aug 3rd.  - had f/u in afib clinic 03/23/18, found to be in SR. ?amio dose change   - he stopped his amio early this year - no recent palpitations.     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    Past Medical History:  Diagnosis Date  . Arthritis   . Bilateral ankle fractures 2014  . Diabetes mellitus   . Heart murmur    as child  . Hyperlipidemia   . Hypertension   . Right forearm fracture 2014  . Tobacco abuse    Past Surgical History:  Procedure Laterality Date  . CARDIAC CATHETERIZATION     as teen  . CORONARY ARTERY BYPASS GRAFT N/A 03/12/2018   Procedure: CORONARY ARTERY BYPASS GRAFTING (CABG) x 4, LIMA to LAD, SVG SEQUENTIALLY to DISTAL CIRCUMFLEX and RAMUS INTERMEDIATED, and SVG to DISTAL RCA,  USING LEFT INTERNAL MAMMARY ARTERY AND RIGHT GREATER SAPHENOUS VEIN HARVESTED ENDOSCOPICALLY;  Surgeon: Delight OvensGerhardt, Edward B, MD;  [facility];  Service: Open Heart Surgery;  Laterality: N/A;  . LEFT HEART CATH AND CORONARY ANGIOGRAPHY N/A 03/09/2018   Procedure: LEFT HEART CATH AND CORONARY ANGIOGRAPHY;  Surgeon: Lyn RecordsSmith, Henry W, MD;  [facility];  Service: Cardiovascular;  Laterality: N/A;  . MEDIASTERNOTOMY  03/12/2018   Procedure: MEDIAN STERNOTOMY;  Surgeon: Delight OvensGerhardt, Edward B, MD;  [facility];  Service: Open Heart Surgery;;  . TEE WITHOUT CARDIOVERSION N/A 03/12/2018   Procedure: TRANSESOPHAGEAL ECHOCARDIOGRAM (TEE);  Surgeon: Delight OvensGerhardt, Edward B, MD;  [facility];  Service: Open Heart Surgery;  Laterality: N/A;     No outpatient medications have been marked as taking for the 02/05/19 encounter (Appointment) with Dina RichBranch, Alleyne Lac  F, MD.     Allergies:   Patient has no known allergies.   Social History   Tobacco Use  . Smoking status: Former Smoker    Packs/day: 0.50    Years: 30.00    Pack years: 15.00    Types: Cigarettes  . Smokeless tobacco: Current User    Last attempt to quit: 03/05/2014  Substance Use Topics  . Alcohol use: Yes    Alcohol/week: 0.0 standard drinks    Comment: 2-3 beers per day  . Drug use: No     Family Hx: The patient's family history is negative  for Colon cancer. He was adopted.  ROS:   Please see the history of present illness.     All other systems reviewed and are negative.   Prior CV studies:   The following studies were reviewed today:  02/2018 echo Study Conclusions  - Left ventricle: The cavity size was normal. Wall thickness was increased in a pattern of mild LVH. Systolic function was normal. The estimated ejection fraction was in the range of 55% to 60%. Wall motion was normal; there were no regional wall motion abnormalities. Doppler parameters are consistent with abnormal left ventricular relaxation (grade 1 diastolic dysfunction).  Impressions:  - Normal LV systolic function; mild diastolic dysfunction; mild LVH.  02/2018 cath  Severe three-vessel coronary artery disease. Moderate three-vessel coronary calcification.  Greater than 95% proximal LAD and 70% mid to distal. First diagonal with multifocal 90% stenosis. The LAD wraps around the left ventricular apex.  Ramus intermedius with proximal and mid 90% stenoses.  Circumflex is totally occluded after a large first obtuse marginal. The obtuse marginal contains 70% small to mid stenosis. Left to left collaterals to the occluded segment.  Dominant right coronary totally occluded proximal to mid segment with right to right and left to right collaterals.  Mild inferior hypokinesis. EF 50 to 55%. Normal LVEDP  Post cath RECOMMENDATIONS:   TCTS consultation to consider multivessel revascularization.  Resume IV heparin  Aggressive secondary risk factor modification: High intensity statin therapy, glycemic control, start low-dose beta-blocker therapy, aspirin, and should be hospitalized until revascularization can occur.  Labs/Other Tests and Data Reviewed:    EKG:  No ECG reviewed.  Recent Labs: 03/11/2018: ALT 23 03/13/2018: Magnesium 2.4 03/15/2018: Hemoglobin 11.5; Platelets 128 03/17/2018: BUN 12; Creatinine, Ser 1.03;  Potassium 3.8; Sodium 133   Recent Lipid Panel Lab Results  Component Value Date/Time   CHOL 154 03/09/2018 02:44 AM   TRIG 318 (H) 03/09/2018 02:44 AM   HDL 27 (L) 03/09/2018 02:44 AM   CHOLHDL 5.7 03/09/2018 02:44 AM   LDLCALC 63 03/09/2018 02:44 AM   LDLDIRECT 174.5 06/05/2013 12:14 PM    Wt Readings from Last 3 Encounters:  10/11/18 213 lb 8 oz (96.8 kg)  04/23/18 192 lb (87.1 kg)  04/02/18 192 lb (87.1 kg)     Objective:    Vital Signs:   Today's Vitals   There is no height or weight on file to calculate BMI.  Normal affect. Normal speech pattern and tone. Comfortable, no apparent distress. No audibel signs of SOB or wheezing  ASSESSMENT & PLAN:    1. CAD - CABG approx 1 year ago - no symptoms, continue current meds. Stop ASA since on xarelto  2. Aflutter - occurred post CABG. Unclear if isolated or ongoing - we will obtain a 21 day event monitor, if no recurrence would stop xarelto and restart ASA  3. Weight gain - check TSH  since he was prevoiusly on amio   Check annual labs   COVID-19 Education: The signs and symptoms of COVID-19 were discussed with the patient and how to seek care for testing (follow up with PCP or arrange E-visit).  The importance of social distancing was discussed today.  Time:   Today, I have spent 15 minutes with the patient with telehealth technology discussing the above problems.     Medication Adjustments/Labs and Tests Ordered: Current medicines are reviewed at length with the patient today.  Concerns regarding medicines are outlined above.   Tests Ordered: No orders of the defined types were placed in this encounter.   Medication Changes: No orders of the defined types were placed in this encounter.   Follow Up:  In Person in 6 month(s)  Signed, Carlyle Dolly, MD  02/05/2019 8:10 AM    [place]

## 2019-02-05 NOTE — Patient Instructions (Signed)
Medication Instructions:  STOP ASPIRIN   Labwork: CMET, MAGNESIUM, CBC, LIPID PANEL, HGBA1C, TSH   Testing/Procedures: Your physician has recommended that you wear an event monitor. Event monitors are medical devices that record the heart's electrical activity. Doctors most often [place] these monitors to diagnose arrhythmias. Arrhythmias are problems with the speed or rhythm of the heartbeat. The monitor is a small, portable device. You can wear one while you do your normal daily activities. This is usually used to diagnose what is causing palpitations/syncope (passing out).    Follow-Up: Your physician wants you to follow-up in: 6 MONTHS. You will receive a reminder letter in the mail two months in advance. If you don't receive a letter, please call our office to schedule the follow-up appointment.   Any Other Special Instructions Will Be Listed Below (If Applicable).     If you need a refill on your cardiac medications before your next appointment, please call your pharmacy.

## 2019-03-07 ENCOUNTER — Other Ambulatory Visit: Payer: Self-pay | Admitting: Family Medicine

## 2019-03-08 ENCOUNTER — Other Ambulatory Visit: Payer: Self-pay | Admitting: Cardiology

## 2019-04-16 ENCOUNTER — Encounter: Payer: Self-pay | Admitting: Pulmonary Disease

## 2019-04-16 ENCOUNTER — Other Ambulatory Visit: Payer: Self-pay

## 2019-04-16 ENCOUNTER — Ambulatory Visit: Payer: 59 | Admitting: Pulmonary Disease

## 2019-04-16 VITALS — BP 120/78 | HR 64 | Temp 98.0°F | Ht 72.0 in | Wt 204.4 lb

## 2019-04-16 DIAGNOSIS — R0681 Apnea, not elsewhere classified: Secondary | ICD-10-CM

## 2019-04-16 NOTE — Patient Instructions (Signed)
Moderate to high probability of significant sleep disordered breathing  Schedule for home sleep study  We will see you about 3 months after set up  Call with significant concerns

## 2019-04-16 NOTE — Progress Notes (Signed)
Ronald Stephens    [id number]    08-Nov-1960  Primary Care Physician:Copland, Karleen Hampshire, MD  Referring Physician: Hannah Beat, MD [address] [place],  [place] [address]  Chief complaint:   Patient being seen for nonrestorative sleep, always tired  HPI:  History of snoring, history of witnessed apneas  His girlfriend is regularly noticed episodes of apneas  History of heart disease, history of COPD  Usually goes to bed between 8 and 9 PM, takes him a few minutes to fall asleep Wakes up 2-3 times during the night, final awakening time about 3:30 in the morning  He feels always tired Sleep is nonrestorative  No headaches in the mornings Occasional dryness of his mouth in the morning Fluctuations in memory  Noncontributory occupational history  Used to wake up in the middle of the night with shortness of breath  Outpatient Encounter Medications as of 04/16/2019  Medication Sig  . atorvastatin (LIPITOR) 80 MG tablet TAKE 1 TABLET BY MOUTH EVERY DAY  . lisinopril (PRINIVIL,ZESTRIL) 2.5 MG tablet Take 1 tablet (2.5 mg total) by mouth daily.  . metFORMIN (GLUCOPHAGE-XR) 500 MG 24 hr tablet TAKE 2 TABLETS BY MOUTH EVERY DAY WITH BREAKFAST  . metoprolol tartrate (LOPRESSOR) 25 MG tablet Take 1 tablet (25 mg total) by mouth 2 (two) times daily. (Patient taking differently: Take 25 mg by mouth daily. )  . omeprazole (PRILOSEC) 20 MG capsule TAKE ONE CAPSULE BY MOUTH TWICE A DAY  . XARELTO 20 MG TABS tablet TAKE 1 TABLET BY MOUTH EVERY DAY WITH SUPPER  . [DISCONTINUED] acetaminophen (TYLENOL) 325 MG tablet Take 2 tablets (650 mg total) by mouth every 6 (six) hours as needed for mild pain.  . [DISCONTINUED] NONFORMULARY OR COMPOUNDED ITEM Epic Account: [id number] Lab Studies: BMP, CBC with diff, HFP: Z79.899 FLP: E78.5 PSA: total and free: Z12.5  Urine microalbumin, E11.9 diabetes mellitus type 2 Hepatitis C: Z11.59 screening for hepatitis c Total and Free  testosterone: E29.1  Re: hypogonadism and fatigue   No facility-administered encounter medications on file as of 04/16/2019.     Allergies as of 04/16/2019  . (No Known Allergies)    Past Medical History:  Diagnosis Date  . Arthritis   . Bilateral ankle fractures 2014  . Diabetes mellitus   . Heart murmur    as child  . Hyperlipidemia   . Hypertension   . Right forearm fracture 2014  . Tobacco abuse     Past Surgical History:  Procedure Laterality Date  . CARDIAC CATHETERIZATION     as teen  . CORONARY ARTERY BYPASS GRAFT N/A 03/12/2018   Procedure: CORONARY ARTERY BYPASS GRAFTING (CABG) x 4, LIMA to LAD, SVG SEQUENTIALLY to DISTAL CIRCUMFLEX and RAMUS INTERMEDIATED, and SVG to DISTAL RCA,  USING LEFT INTERNAL MAMMARY ARTERY AND RIGHT GREATER SAPHENOUS VEIN HARVESTED ENDOSCOPICALLY;  Surgeon: Delight Ovens, MD;  [facility];  Service: Open Heart Surgery;  Laterality: N/A;  . LEFT HEART CATH AND CORONARY ANGIOGRAPHY N/A 03/09/2018   Procedure: LEFT HEART CATH AND CORONARY ANGIOGRAPHY;  Surgeon: Lyn Records, MD;  [facility];  Service: Cardiovascular;  Laterality: N/A;  . MEDIASTERNOTOMY  03/12/2018   Procedure: MEDIAN STERNOTOMY;  Surgeon: Delight Ovens, MD;  Location: [facility];  Service: Open Heart Surgery;;  . TEE WITHOUT CARDIOVERSION N/A 03/12/2018   Procedure: TRANSESOPHAGEAL ECHOCARDIOGRAM (TEE);  Surgeon: Delight Ovens, MD;  [facility];  Service: Open Heart  Surgery;  Laterality: N/A;    Family History  Adopted: Yes  Problem Relation Age of Onset  . Colon cancer Neg Hx     Social History   Socioeconomic History  . Marital status: Married    Spouse name: Not on file  . Number of children: Not on file  . Years of education: Not on file  . Highest education level: Not on file  Occupational History  . Occupation: Librarian, academic  Social Needs  . Financial resource strain: Not on file  . Food insecurity    Worry: Not on file     Inability: Not on file  . Transportation needs    Medical: Not on file    Non-medical: Not on file  Tobacco Use  . Smoking status: Former Smoker    Packs/day: 0.50    Years: 30.00    Pack years: 15.00    Types: Cigarettes  . Smokeless tobacco: Current User    Last attempt to quit: 03/05/2014  Substance and Sexual Activity  . Alcohol use: Yes    Alcohol/week: 0.0 standard drinks    Comment: 2-3 beers per day  . Drug use: No  . Sexual activity: Not on file  Lifestyle  . Physical activity    Days per week: Not on file    Minutes per session: Not on file  . Stress: Not on file  Relationships  . Social Herbalist on phone: Not on file    Gets together: Not on file    Attends religious service: Not on file    Active member of club or organization: Not on file    Attends meetings of clubs or organizations: Not on file    Relationship status: Not on file  . Intimate partner violence    Fear of current or ex partner: Not on file    Emotionally abused: Not on file    Physically abused: Not on file    Forced sexual activity: Not on file  Other Topics Concern  . Not on file  Social History Narrative  . Not on file    Review of Systems  HENT: Negative.   Respiratory: Positive for apnea.   Psychiatric/Behavioral: Positive for sleep disturbance.  All other systems reviewed and are negative.   Vitals:   04/16/19 1500 04/16/19 1501  BP:  120/78  Pulse:  64  Temp: 98 F (36.7 C)   SpO2:  97%     Physical Exam  Constitutional: He appears well-developed and well-nourished.  HENT:  Head: Normocephalic and atraumatic.  Eyes: Pupils are equal, round, and reactive to light. Conjunctivae and EOM are normal. Right eye exhibits no discharge. Left eye exhibits no discharge.  Neck: Normal range of motion. Neck supple. No tracheal deviation present. No thyromegaly present.  Cardiovascular: Normal rate, regular rhythm and normal heart sounds.  Pulmonary/Chest: Effort  normal and breath sounds normal. No respiratory distress. He has no wheezes. He has no rales. He exhibits no tenderness.  Abdominal: Soft. Bowel sounds are normal. He exhibits no distension.    Epworth of 2  Data Reviewed: PFT from 03/09/2018 revealed severe obstructive airway disease  Assessment:  Moderate to high probability of significant obstructive sleep apnea -Witnessed apneas, snoring, nonrestorative sleep  Severe obstructive lung disease -PFT from 2019  History of coronary artery disease -Post quadruple bypass -No chest pains or chest discomfort   Plan/Recommendations: Pathophysiology of sleep disordered breathing discussed with the patient  Treatment options of sleep disordered breathing  discussed with the patient  We will set the patient up for home sleep study  Follow-up in 3 months  Encouraged to call with any significant concerns   Virl DiamondAdewale Melina Mosteller MD  Pulmonary and Critical Care 04/16/2019, 3:16 PM  CC: Hannah Beatopland, Spencer, MD

## 2019-04-30 ENCOUNTER — Ambulatory Visit: Payer: 59

## 2019-04-30 ENCOUNTER — Other Ambulatory Visit: Payer: Self-pay

## 2019-04-30 DIAGNOSIS — G4733 Obstructive sleep apnea (adult) (pediatric): Secondary | ICD-10-CM

## 2019-04-30 DIAGNOSIS — R0681 Apnea, not elsewhere classified: Secondary | ICD-10-CM

## 2019-05-02 ENCOUNTER — Encounter: Payer: Self-pay | Admitting: Family Medicine

## 2019-05-02 ENCOUNTER — Other Ambulatory Visit: Payer: Self-pay

## 2019-05-02 ENCOUNTER — Ambulatory Visit: Payer: 59 | Admitting: Family Medicine

## 2019-05-02 ENCOUNTER — Telehealth: Payer: Self-pay | Admitting: Pulmonary Disease

## 2019-05-02 VITALS — BP 130/78 | HR 76 | Temp 97.7°F | Ht 72.0 in | Wt 203.4 lb

## 2019-05-02 DIAGNOSIS — Z23 Encounter for immunization: Secondary | ICD-10-CM

## 2019-05-02 DIAGNOSIS — G4733 Obstructive sleep apnea (adult) (pediatric): Secondary | ICD-10-CM

## 2019-05-02 DIAGNOSIS — L723 Sebaceous cyst: Secondary | ICD-10-CM | POA: Diagnosis not present

## 2019-05-02 DIAGNOSIS — L089 Local infection of the skin and subcutaneous tissue, unspecified: Secondary | ICD-10-CM

## 2019-05-02 MED ORDER — SULFAMETHOXAZOLE-TRIMETHOPRIM 800-160 MG PO TABS: 1.0000 | ORAL_TABLET | Freq: Two times a day (BID) | ORAL | 0 refills | Status: DC

## 2019-05-02 MED ORDER — AMOXICILLIN-POT CLAVULANATE 875-125 MG PO TABS: 1.0000 | ORAL_TABLET | Freq: Two times a day (BID) | ORAL | 0 refills | Status: DC

## 2019-05-02 NOTE — Telephone Encounter (Signed)
HST was performed on 04/30/2019. Dr. Ander Slade has reviewed the HST. Patient had an average of 12.2 events per hour. He recommends cpap therapy, starting with an auto cpap set at 5-15cm. Will speak with the patient before ordering cpap therapy.

## 2019-05-02 NOTE — Assessment & Plan Note (Signed)
Mid low back just at belt line  Suspect it was traumatized by clothing Remote h/o MRSA  Will cover with augmentin and bactrim DS (to cover for mrsa) Clean with soap and water  Lightly cover/protect  Warm compress Based on appearance- suspect it will drain Disc s/s to watch for (inc pain/redness)  Will call if worse or no imp  May need removal of cyst eventually if this becomes recurrent

## 2019-05-02 NOTE — Patient Instructions (Signed)
Keep your cyst clean with soap and water  Cover lightly  Protect from friction   Take the augmentin and the bactrim DS as directed Take with food  Protect yourself from the sun   Tylenol as needed   Watch for increase in size/pain/redness  This may drain-that is fine   Update if not starting to improve in a week or if worsening

## 2019-05-02 NOTE — Progress Notes (Signed)
Subjective:    Patient ID: Ronald Stephens, male    DOB: [birth year], 58 y.o.   MRN: [id number]  HPI 58 yo pt of Dr Lorelei Pont here with a cyst on his lower back area  He is diabetic  Also takes xarelto (CAD with atrial flutter)  Started as a bump on low back -size of a button  Now is bigger Rubbing on his belt -causing problems  Very tender   Has not drained No fever Feels ok   Has had mrsa in the past 2018  When he was in prison - wound on legs (from skinning leg on wt bench)  Treated orally   Patient Active Problem List   Diagnosis Date Noted  . Infected sebaceous cyst 05/02/2019  . COPD, severe ([place]) 03/12/2018  . 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  . Coronary artery disease 03/12/2018  . NSTEMI (non-ST elevated myocardial infarction) ([place]) 03/09/2018  . Controlled type 2 diabetes mellitus without complication, without long-term current use of insulin ([place]) 04/22/2009  . Hyperlipidemia associated with type 2 diabetes mellitus ([place]) 04/10/2009  . Essential hypertension 03/14/2009  . OSTEOARTHRITIS 03/14/2009  . TOBACCO USE 03/13/2009   Past Medical History:  Diagnosis Date  . Arthritis   . Bilateral ankle fractures 2014  . Diabetes mellitus   . Heart murmur    as child  . Hyperlipidemia   . Hypertension   . Right forearm fracture 2014  . Tobacco abuse    Past Surgical History:  Procedure Laterality Date  . CARDIAC CATHETERIZATION     as teen  . CORONARY ARTERY BYPASS GRAFT N/A 03/12/2018   Procedure: CORONARY ARTERY BYPASS GRAFTING (CABG) x 4, LIMA to LAD, SVG SEQUENTIALLY to DISTAL CIRCUMFLEX and RAMUS INTERMEDIATED, and SVG to DISTAL RCA,  USING LEFT INTERNAL MAMMARY ARTERY AND RIGHT GREATER SAPHENOUS VEIN HARVESTED ENDOSCOPICALLY;  Surgeon: Grace Isaac, MD;  Location: [place];  Service: Open Heart Surgery;  Laterality: N/A;  . LEFT HEART CATH AND CORONARY ANGIOGRAPHY N/A 03/09/2018   Procedure: LEFT HEART CATH AND CORONARY ANGIOGRAPHY;  Surgeon: Belva Crome,  MD;  Location: [place] CV LAB;  Service: Cardiovascular;  Laterality: N/A;  . MEDIASTERNOTOMY  03/12/2018   Procedure: MEDIAN STERNOTOMY;  Surgeon: Grace Isaac, MD;  Location: [place];  Service: Open Heart Surgery;;  . TEE WITHOUT CARDIOVERSION N/A 03/12/2018   Procedure: TRANSESOPHAGEAL ECHOCARDIOGRAM (TEE);  Surgeon: Grace Isaac, MD;  Location: [place];  Service: Open Heart Surgery;  Laterality: N/A;   Social History   Tobacco Use  . Smoking status: Former Smoker    Packs/day: 0.50    Years: 30.00    Pack years: 15.00    Types: Cigarettes  . Smokeless tobacco: Current User  Substance Use Topics  . Alcohol use: Yes    Alcohol/week: 0.0 standard drinks    Comment: 2-3 beers per day  . Drug use: No   Family History  Adopted: Yes  Problem Relation Age of Onset  . Colon cancer Neg Hx    No Known Allergies Current Outpatient Medications on File Prior to Visit  Medication Sig Dispense Refill  . atorvastatin (LIPITOR) 80 MG tablet TAKE 1 TABLET BY MOUTH EVERY DAY 90 tablet 1  . lisinopril (PRINIVIL,ZESTRIL) 2.5 MG tablet Take 1 tablet (2.5 mg total) by mouth daily. 90 tablet 3  . metFORMIN (GLUCOPHAGE-XR) 500 MG 24 hr tablet TAKE 2 TABLETS BY MOUTH EVERY DAY WITH BREAKFAST 180 tablet 3  . omeprazole (PRILOSEC) 20 MG capsule  TAKE ONE CAPSULE BY MOUTH TWICE A DAY 60 capsule 5  . XARELTO 20 MG TABS tablet TAKE 1 TABLET BY MOUTH EVERY DAY WITH SUPPER 30 tablet 6  . metoprolol tartrate (LOPRESSOR) 25 MG tablet Take 1 tablet (25 mg total) by mouth 2 (two) times daily. (Patient taking differently: Take 25 mg by mouth daily. ) 60 tablet 6   No current facility-administered medications on file prior to visit.      Review of Systems  Constitutional: Negative for activity change, appetite change, fatigue, fever and unexpected weight change.  HENT: Negative for congestion, rhinorrhea, sore throat and trouble swallowing.   Eyes: Negative for pain, redness, itching and visual  disturbance.  Respiratory: Negative for cough, chest tightness, shortness of breath and wheezing.   Cardiovascular: Negative for chest pain and palpitations.  Gastrointestinal: Negative for abdominal pain, blood in stool, constipation, diarrhea and nausea.  Endocrine: Negative for cold intolerance, heat intolerance, polydipsia and polyuria.  Genitourinary: Negative for difficulty urinating, dysuria, frequency and urgency.  Musculoskeletal: Negative for arthralgias, joint swelling and myalgias.  Skin: Negative for pallor and rash.       Red painful cyst on low back   Neurological: Negative for dizziness, tremors, weakness, numbness and headaches.  Hematological: Negative for adenopathy. Does not bruise/bleed easily.  Psychiatric/Behavioral: Negative for decreased concentration and dysphoric mood. The patient is not nervous/anxious.        Objective:   Physical Exam Constitutional:      General: He is not in acute distress.    Appearance: Normal appearance. He is normal weight. He is not ill-appearing.  Eyes:     Conjunctiva/sclera: Conjunctivae normal.     Pupils: Pupils are equal, round, and reactive to light.  Cardiovascular:     Rate and Rhythm: Normal rate and regular rhythm.  Pulmonary:     Effort: Pulmonary effort is normal. No respiratory distress.     Breath sounds: Normal breath sounds. No wheezing.  Musculoskeletal:        General: No swelling.  Skin:    Comments: 3 cm round area of induration/erythema that is firm on mid low back  Small white head /opening noted  Mildly tender  No streaking or warmth   Neurological:     Mental Status: He is alert.  Psychiatric:        Mood and Affect: Mood normal.           Assessment & Plan:   Problem List Items Addressed This Visit      Musculoskeletal and Integument   Infected sebaceous cyst    Mid low back just at belt line  Suspect it was traumatized by clothing Remote h/o MRSA  Will cover with augmentin and  bactrim DS (to cover for mrsa) Clean with soap and water  Lightly cover/protect  Warm compress Based on appearance- suspect it will drain Disc s/s to watch for (inc pain/redness)  Will call if worse or no imp  May need removal of cyst eventually if this becomes recurrent        Relevant Medications   sulfamethoxazole-trimethoprim (BACTRIM DS) 800-160 MG tablet

## 2019-05-08 ENCOUNTER — Other Ambulatory Visit: Payer: Self-pay | Admitting: Cardiology

## 2019-05-24 NOTE — Telephone Encounter (Signed)
Spoke with patient. He is aware of results and is willing to start the cpap machine. Explained to him the process of the cpap machine, he verbalized understanding.   Order has been placed.

## 2019-08-16 ENCOUNTER — Other Ambulatory Visit: Payer: Self-pay | Admitting: Cardiology

## 2019-09-11 ENCOUNTER — Other Ambulatory Visit: Payer: Self-pay | Admitting: Family Medicine

## 2019-09-11 NOTE — Telephone Encounter (Signed)
Needs follow up appointment. Last visit with Dr Patsy Lager was in February 2020. Thank you

## 2019-09-11 NOTE — Telephone Encounter (Signed)
cpx 3/10 Labs 3/5 Pt aware

## 2019-09-12 ENCOUNTER — Other Ambulatory Visit: Payer: Self-pay | Admitting: Cardiology

## 2019-10-08 ENCOUNTER — Other Ambulatory Visit: Payer: Self-pay | Admitting: Cardiology

## 2019-10-18 ENCOUNTER — Other Ambulatory Visit (INDEPENDENT_AMBULATORY_CARE_PROVIDER_SITE_OTHER): Payer: 59

## 2019-10-18 ENCOUNTER — Other Ambulatory Visit: Payer: Self-pay

## 2019-10-18 DIAGNOSIS — E119 Type 2 diabetes mellitus without complications: Secondary | ICD-10-CM

## 2019-10-18 DIAGNOSIS — Z0000 Encounter for general adult medical examination without abnormal findings: Secondary | ICD-10-CM | POA: Diagnosis not present

## 2019-10-18 LAB — CBC WITH DIFFERENTIAL/PLATELET
Basophils Absolute: 0 10*3/uL (ref 0.0–0.1)
Basophils Relative: 0.6 % (ref 0.0–3.0)
Eosinophils Absolute: 0.1 10*3/uL (ref 0.0–0.7)
Eosinophils Relative: 0.9 % (ref 0.0–5.0)
HCT: 43 % (ref 39.0–52.0)
Hemoglobin: 14.3 g/dL (ref 13.0–17.0)
Lymphocytes Relative: 31.3 % (ref 12.0–46.0)
Lymphs Abs: 2.4 10*3/uL (ref 0.7–4.0)
MCHC: 33.2 g/dL (ref 30.0–36.0)
MCV: 85.2 fl (ref 78.0–100.0)
Monocytes Absolute: 0.7 10*3/uL (ref 0.1–1.0)
Monocytes Relative: 8.5 % (ref 3.0–12.0)
Neutro Abs: 4.6 10*3/uL (ref 1.4–7.7)
Neutrophils Relative %: 58.7 % (ref 43.0–77.0)
Platelets: 180 10*3/uL (ref 150.0–400.0)
RBC: 5.05 Mil/uL (ref 4.22–5.81)
RDW: 15.1 % (ref 11.5–15.5)
WBC: 7.8 10*3/uL (ref 4.0–10.5)

## 2019-10-18 LAB — MICROALBUMIN / CREATININE URINE RATIO
Creatinine,U: 145.3 mg/dL
Microalb Creat Ratio: 1.5 mg/g (ref 0.0–30.0)
Microalb, Ur: 2.2 mg/dL — ABNORMAL HIGH (ref 0.0–1.9)

## 2019-10-18 LAB — HEPATIC FUNCTION PANEL
ALT: 31 U/L (ref 0–53)
AST: 30 U/L (ref 0–37)
Albumin: 4 g/dL (ref 3.5–5.2)
Alkaline Phosphatase: 59 U/L (ref 39–117)
Bilirubin, Direct: 0.2 mg/dL (ref 0.0–0.3)
Total Bilirubin: 0.7 mg/dL (ref 0.2–1.2)
Total Protein: 7.6 g/dL (ref 6.0–8.3)

## 2019-10-18 LAB — BASIC METABOLIC PANEL
BUN: 9 mg/dL (ref 6–23)
CO2: 29 mEq/L (ref 19–32)
Calcium: 9.3 mg/dL (ref 8.4–10.5)
Chloride: 100 mEq/L (ref 96–112)
Creatinine, Ser: 0.97 mg/dL (ref 0.40–1.50)
GFR: 79.08 mL/min (ref >60.00)
Glucose, Bld: 153 mg/dL — ABNORMAL HIGH (ref 70–99)
Potassium: 4.7 mEq/L (ref 3.5–5.1)
Sodium: 135 mEq/L (ref 135–145)

## 2019-10-18 LAB — LIPID PANEL
Cholesterol: 102 mg/dL (ref 0–200)
HDL: 44.7 mg/dL (ref >39.00)
LDL Cholesterol: 41 mg/dL (ref 0–99)
NonHDL: 57.37
Total CHOL/HDL Ratio: 2
Triglycerides: 82 mg/dL (ref 0.0–149.0)
VLDL: 16.4 mg/dL (ref 0.0–40.0)

## 2019-10-18 LAB — HEMOGLOBIN A1C: Hgb A1c MFr Bld: 6.9 % — ABNORMAL HIGH (ref 4.6–6.5)

## 2019-10-21 LAB — PSA, TOTAL WITH REFLEX TO PSA, FREE: PSA, Total: 0.2 ng/mL (ref <=4.0)

## 2019-10-22 NOTE — Progress Notes (Signed)
Ronald Meddaugh T. Burkley Dech, MD Primary Care and Sports Medicine [facility] at [facility] [address] [place], [address] Phone: [phone]  FAX: [phone]  Ronald Stephens - 59 y.o. male  MRN [id number]  Date of Birth: 09-Jun-1961  Visit Date: 10/23/2019  PCP: Hannah Beat, MD  Referred by: Hannah Beat, MD  Chief Complaint  Patient presents with  . Annual Exam    This visit occurred during the SARS-CoV-2 public health emergency.  Safety protocols were in place, including screening questions prior to the visit, additional usage of staff PPE, and extensive cleaning of exam room while observing appropriate contact time as indicated for disinfecting solutions.   Patient Care Team: Hannah Beat, MD as PCP - General Branch, Dorothe Pea, MD as PCP - Cardiology (Cardiology) Jena Gauss Gerrit Friends, MD as Consulting Physician (Gastroenterology) Subjective:   Ronald Stephens is a 59 y.o. pleasant patient who presents with the following:  Preventative Health Maintenance Visit:  Health Maintenance Summary Reviewed and updated, unless pt declines services.  Tobacco History Reviewed. Quit with his heart issues.  Alcohol: No concerns, no excessive use.  A couple a day, some more on the weekends.  Exercise Habits: Some activity, rec at least 30 mins 5 times a week STD concerns: no risk or activity to increase risk Drug Use: None  He is due for his colonoscopy.  Hip joint pain and walking a lot for work. Feels like   Health Maintenance  Topic Date Due  . Hepatitis C Screening  May 16, 1961  . FOOT EXAM  04/17/1970  . OPHTHALMOLOGY EXAM  04/17/1970  . COLONOSCOPY  04/17/2010  . HEMOGLOBIN A1C  04/19/2020  . TETANUS/TDAP  10/17/2020  . INFLUENZA VACCINE  Completed  . PNEUMOCOCCAL POLYSACCHARIDE VACCINE AGE 62-64 HIGH RISK  Completed  . HIV Screening  Completed   Immunization History  Administered Date(s) Administered  . Influenza Split  04/27/2011  . Influenza,inj,Quad PF,6+ Mos 05/02/2019  . Pneumococcal Polysaccharide-23 10/18/2010  . Td 10/18/2010   Diabetes Mellitus: Tolerating Medications: yes Compliance with diet: fair, Body mass index is 29.55 kg/m. Exercise: minimal / intermittent Avg blood sugars at home: not checking Foot problems: none Hypoglycemia: none No nausea, vomitting, blurred vision, polyuria.  Lab Results  Component Value Date   HGBA1C 6.9 (H) 10/18/2019   HGBA1C 6.7 (A) 10/11/2018   HGBA1C 8.0 (H) 03/11/2018   Lab Results  Component Value Date   MICROALBUR 2.2 (H) 10/18/2019   LDLCALC 41 10/18/2019   CREATININE 0.97 10/18/2019    Wt Readings from Last 3 Encounters:  10/23/19 203 lb (92.1 kg)  05/02/19 203 lb 6 oz (92.3 kg)  04/16/19 204 lb 6.4 oz (92.7 kg)     Patient Active Problem List   Diagnosis Date Noted  . COPD, severe (HCC) 03/12/2018    Priority: High  . 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    Priority: High  . NSTEMI (non-ST elevated myocardial infarction) (HCC) 03/09/2018    Priority: High  . Controlled type 2 diabetes mellitus without complication, without long-term current use of insulin (HCC) 04/22/2009    Priority: High  . Hyperlipidemia associated with type 2 diabetes mellitus (HCC) 04/10/2009    Priority: High  . Essential hypertension 03/14/2009    Priority: High  . TOBACCO USE 03/13/2009    Priority: Medium  . Coronary artery disease 03/12/2018  . OSTEOARTHRITIS 03/14/2009    Past Medical History:  Diagnosis Date  . Arthritis   . Bilateral ankle fractures 2014  .  Diabetes mellitus   . Heart murmur    as child  . Hyperlipidemia   . Hypertension   . Right forearm fracture 2014  . Tobacco abuse     Past Surgical History:  Procedure Laterality Date  . CARDIAC CATHETERIZATION     as teen  . CORONARY ARTERY BYPASS GRAFT N/A 03/12/2018   Procedure: CORONARY ARTERY BYPASS GRAFTING (CABG) x 4, LIMA to LAD, SVG SEQUENTIALLY to DISTAL CIRCUMFLEX and RAMUS  INTERMEDIATED, and SVG to DISTAL RCA,  USING LEFT INTERNAL MAMMARY ARTERY AND RIGHT GREATER SAPHENOUS VEIN HARVESTED ENDOSCOPICALLY;  Surgeon: Delight Ovens, MD;  Location: [facility] OR;  Service: Open Heart Surgery;  Laterality: N/A;  . LEFT HEART CATH AND CORONARY ANGIOGRAPHY N/A 03/09/2018   Procedure: LEFT HEART CATH AND CORONARY ANGIOGRAPHY;  Surgeon: Lyn Records, MD;  Location: [facility] INVASIVE CV LAB;  Service: Cardiovascular;  Laterality: N/A;  . MEDIASTERNOTOMY  03/12/2018   Procedure: MEDIAN STERNOTOMY;  Surgeon: Delight Ovens, MD;  Location: [facility] OR;  Service: Open Heart Surgery;;  . TEE WITHOUT CARDIOVERSION N/A 03/12/2018   Procedure: TRANSESOPHAGEAL ECHOCARDIOGRAM (TEE);  Surgeon: Delight Ovens, MD;  Location: [facility] OR;  Service: Open Heart Surgery;  Laterality: N/A;    Family History  Adopted: Yes  Problem Relation Age of Onset  . Colon cancer Neg Hx     Past Medical History, Surgical History, Social History, Family History, Problem List, Medications, and Allergies have been reviewed and updated if relevant.  Review of Systems: Pertinent positives are listed above.  Otherwise, a full 14 point review of systems has been done in full and it is negative except where it is noted positive.  Objective:   BP 140/80   Pulse 67   Temp 98.6 F (37 C) (Temporal)   Ht 5' 9.5" (1.765 m)   Wt 203 lb (92.1 kg)   SpO2 98%   BMI 29.55 kg/m  Ideal Body Weight: Weight in (lb) to have BMI = 25: 171.4  Ideal Body Weight: Weight in (lb) to have BMI = 25: 171.4 No exam data present Depression screen [facility] 2/9 10/23/2019 02/28/2018  Decreased Interest 0 0  Down, Depressed, Hopeless 0 0  PHQ - 2 Score 0 0     GEN: well developed, well nourished, no acute distress Eyes: conjunctiva and lids normal, PERRLA, EOMI ENT: TM clear, nares clear, oral exam WNL Neck: supple, no lymphadenopathy, no thyromegaly, no JVD Pulm: clear to auscultation and percussion, respiratory effort normal CV:  regular rate and rhythm, S1-S2, no murmur, rub or gallop, no bruits, peripheral pulses normal and symmetric, no cyanosis, clubbing, edema or varicosities GI: soft, non-tender; no hepatosplenomegaly, masses; active bowel sounds all quadrants GU: no hernia, testicular mass, penile discharge Lymph: no cervical, axillary or inguinal adenopathy MSK: gait normal, muscle tone and strength WNL, no joint swelling, effusions, discoloration, crepitus  SKIN: clear, good turgor, color WNL, no rashes, lesions, or ulcerations Neuro: normal mental status, normal strength, sensation, and motion Psych: alert; oriented to person, place and time, normally interactive and not anxious or depressed in appearance.  All labs reviewed with patient. Results for orders placed or performed in visit on 10/18/19  Lipid panel  Result Value Ref Range   Cholesterol 102 0 - 200 mg/dL   Triglycerides 07.6 0.0 - 149.0 mg/dL   HDL 22.63 >33.54 mg/dL   VLDL 56.2 0.0 - 56.3 mg/dL   LDL Cholesterol 41 0 - 99 mg/dL   Total CHOL/HDL Ratio 2  NonHDL 57.37   Hepatic function panel  Result Value Ref Range   Total Bilirubin 0.7 0.2 - 1.2 mg/dL   Bilirubin, Direct 0.2 0.0 - 0.3 mg/dL   Alkaline Phosphatase 59 39 - 117 U/L   AST 30 0 - 37 U/L   ALT 31 0 - 53 U/L   Total Protein 7.6 6.0 - 8.3 g/dL   Albumin 4.0 3.5 - 5.2 g/dL  Basic metabolic panel  Result Value Ref Range   Sodium 135 135 - 145 mEq/L   Potassium 4.7 3.5 - 5.1 mEq/L   Chloride 100 96 - 112 mEq/L   CO2 29 19 - 32 mEq/L   Glucose, Bld 153 (H) 70 - 99 mg/dL   BUN 9 6 - 23 mg/dL   Creatinine, Ser 0.97 0.40 - 1.50 mg/dL   GFR 79.08 >60.00 mL/min   Calcium 9.3 8.4 - 10.5 mg/dL  CBC with Differential/Platelet  Result Value Ref Range   WBC 7.8 4.0 - 10.5 K/uL   RBC 5.05 4.22 - 5.81 Mil/uL   Hemoglobin 14.3 13.0 - 17.0 g/dL   HCT 43.0 39.0 - 52.0 %   MCV 85.2 78.0 - 100.0 fl   MCHC 33.2 30.0 - 36.0 g/dL   RDW 15.1 11.5 - 15.5 %   Platelets 180.0 150.0 -  400.0 K/uL   Neutrophils Relative % 58.7 43.0 - 77.0 %   Lymphocytes Relative 31.3 12.0 - 46.0 %   Monocytes Relative 8.5 3.0 - 12.0 %   Eosinophils Relative 0.9 0.0 - 5.0 %   Basophils Relative 0.6 0.0 - 3.0 %   Neutro Abs 4.6 1.4 - 7.7 K/uL   Lymphs Abs 2.4 0.7 - 4.0 K/uL   Monocytes Absolute 0.7 0.1 - 1.0 K/uL   Eosinophils Absolute 0.1 0.0 - 0.7 K/uL   Basophils Absolute 0.0 0.0 - 0.1 K/uL  Hemoglobin A1c  Result Value Ref Range   Hgb A1c MFr Bld 6.9 (H) 4.6 - 6.5 %  Microalbumin / creatinine urine ratio  Result Value Ref Range   Microalb, Ur 2.2 (H) 0.0 - 1.9 mg/dL   Creatinine,U 145.3 mg/dL   Microalb Creat Ratio 1.5 0.0 - 30.0 mg/g  PSA, Total with Reflex to PSA, Free  Result Value Ref Range   PSA, Total 0.2 < OR = 4.0 ng/mL    Assessment and Plan:     ICD-10-CM   1. Healthcare maintenance  Z00.00    Doing well - a1c is normal Compliant is normal  Diet is going well.  New girlfriend cooking well and eating a lot of veggies Wants cologuard  Health Maintenance Exam: The patient's preventative maintenance and recommended screening tests for an annual wellness exam were reviewed in full today. Brought up to date unless services declined.  Counselled on the importance of diet, exercise, and its role in overall health and mortality. The patient's FH and SH was reviewed, including their home life, tobacco status, and drug and alcohol status.  Follow-up in 1 year for physical exam or additional follow-up below.  Follow-up: No follow-ups on file. Or follow-up in 1 year if not noted.  No orders of the defined types were placed in this encounter.  Medications Discontinued During This Encounter  Medication Reason  . amoxicillin-clavulanate (AUGMENTIN) 875-125 MG tablet Completed Course  . sulfamethoxazole-trimethoprim (BACTRIM DS) 800-160 MG tablet Completed Course   No orders of the defined types were placed in this encounter.   Signed,  Maud Deed. Kree Rafter,  MD   Allergies  as of 10/23/2019   No Known Allergies     Medication List       Accurate as of October 23, 2019  9:05 AM. If you have any questions, ask your nurse or doctor.        STOP taking these medications   amoxicillin-clavulanate 875-125 MG tablet Commonly known as: AUGMENTIN Stopped by: Hannah Beat, MD   sulfamethoxazole-trimethoprim 800-160 MG tablet Commonly known as: BACTRIM DS Stopped by: Hannah Beat, MD     TAKE these medications   atorvastatin 80 MG tablet Commonly known as: LIPITOR TAKE 1 TABLET BY MOUTH EVERY DAY   lisinopril 2.5 MG tablet Commonly known as: ZESTRIL Take 1 tablet (2.5 mg total) by mouth daily.   metFORMIN 500 MG 24 hr tablet Commonly known as: GLUCOPHAGE-XR TAKE 2 TABLETS BY MOUTH EVERY DAY WITH BREAKFAST   metoprolol tartrate 25 MG tablet Commonly known as: LOPRESSOR TAKE 1 TABLET BY MOUTH TWICE A DAY   omeprazole 20 MG capsule Commonly known as: PRILOSEC TAKE ONE CAPSULE BY MOUTH TWICE A DAY   Xarelto 20 MG Tabs tablet Generic drug: rivaroxaban TAKE 1 TABLET BY MOUTH EVERY DAY WITH SUPPER

## 2019-10-23 ENCOUNTER — Other Ambulatory Visit: Payer: Self-pay

## 2019-10-23 ENCOUNTER — Ambulatory Visit (INDEPENDENT_AMBULATORY_CARE_PROVIDER_SITE_OTHER): Payer: 59 | Admitting: Family Medicine

## 2019-10-23 ENCOUNTER — Encounter: Payer: Self-pay | Admitting: Family Medicine

## 2019-10-23 ENCOUNTER — Other Ambulatory Visit: Payer: Self-pay | Admitting: Cardiology

## 2019-10-23 VITALS — BP 140/80 | HR 67 | Temp 98.6°F | Ht 69.5 in | Wt 203.0 lb

## 2019-10-23 DIAGNOSIS — Z0000 Encounter for general adult medical examination without abnormal findings: Secondary | ICD-10-CM | POA: Diagnosis not present

## 2019-10-31 ENCOUNTER — Other Ambulatory Visit: Payer: Self-pay | Admitting: Family Medicine

## 2019-11-21 ENCOUNTER — Telehealth: Payer: Self-pay | Admitting: Family Medicine

## 2019-11-21 ENCOUNTER — Other Ambulatory Visit: Payer: Self-pay | Admitting: Cardiology

## 2019-11-21 NOTE — Telephone Encounter (Signed)
Noted  

## 2019-11-21 NOTE — Telephone Encounter (Signed)
JUST FYI when patient comes in for next appt   Received a call today from [place], nurse with [facility].  They have been trying to reach out to the patient several times and could not get a hold of him. She asked that the next time we see the patient if we can tell him to reach out to [facility]. In their policy if they are unable to reach the patient they have to contact the pcp to advised that if we see the patient to let him know they have been reaching out to him    Phone- [phone]

## 2019-12-08 ENCOUNTER — Other Ambulatory Visit: Payer: Self-pay | Admitting: Family Medicine

## 2019-12-12 ENCOUNTER — Other Ambulatory Visit: Payer: Self-pay | Admitting: Family Medicine

## 2019-12-26 ENCOUNTER — Other Ambulatory Visit: Payer: Self-pay | Admitting: Cardiology

## 2020-05-22 ENCOUNTER — Other Ambulatory Visit: Payer: Self-pay | Admitting: Cardiology

## 2020-07-08 IMAGING — DX DG CHEST 1V PORT
1 series · 1 of 1 positions shown · non-contrast
Comparison: 03/08/2018

CLINICAL DATA: Status post CABG x4.

EXAM:
PORTABLE CHEST 1 VIEW

[chest ap]
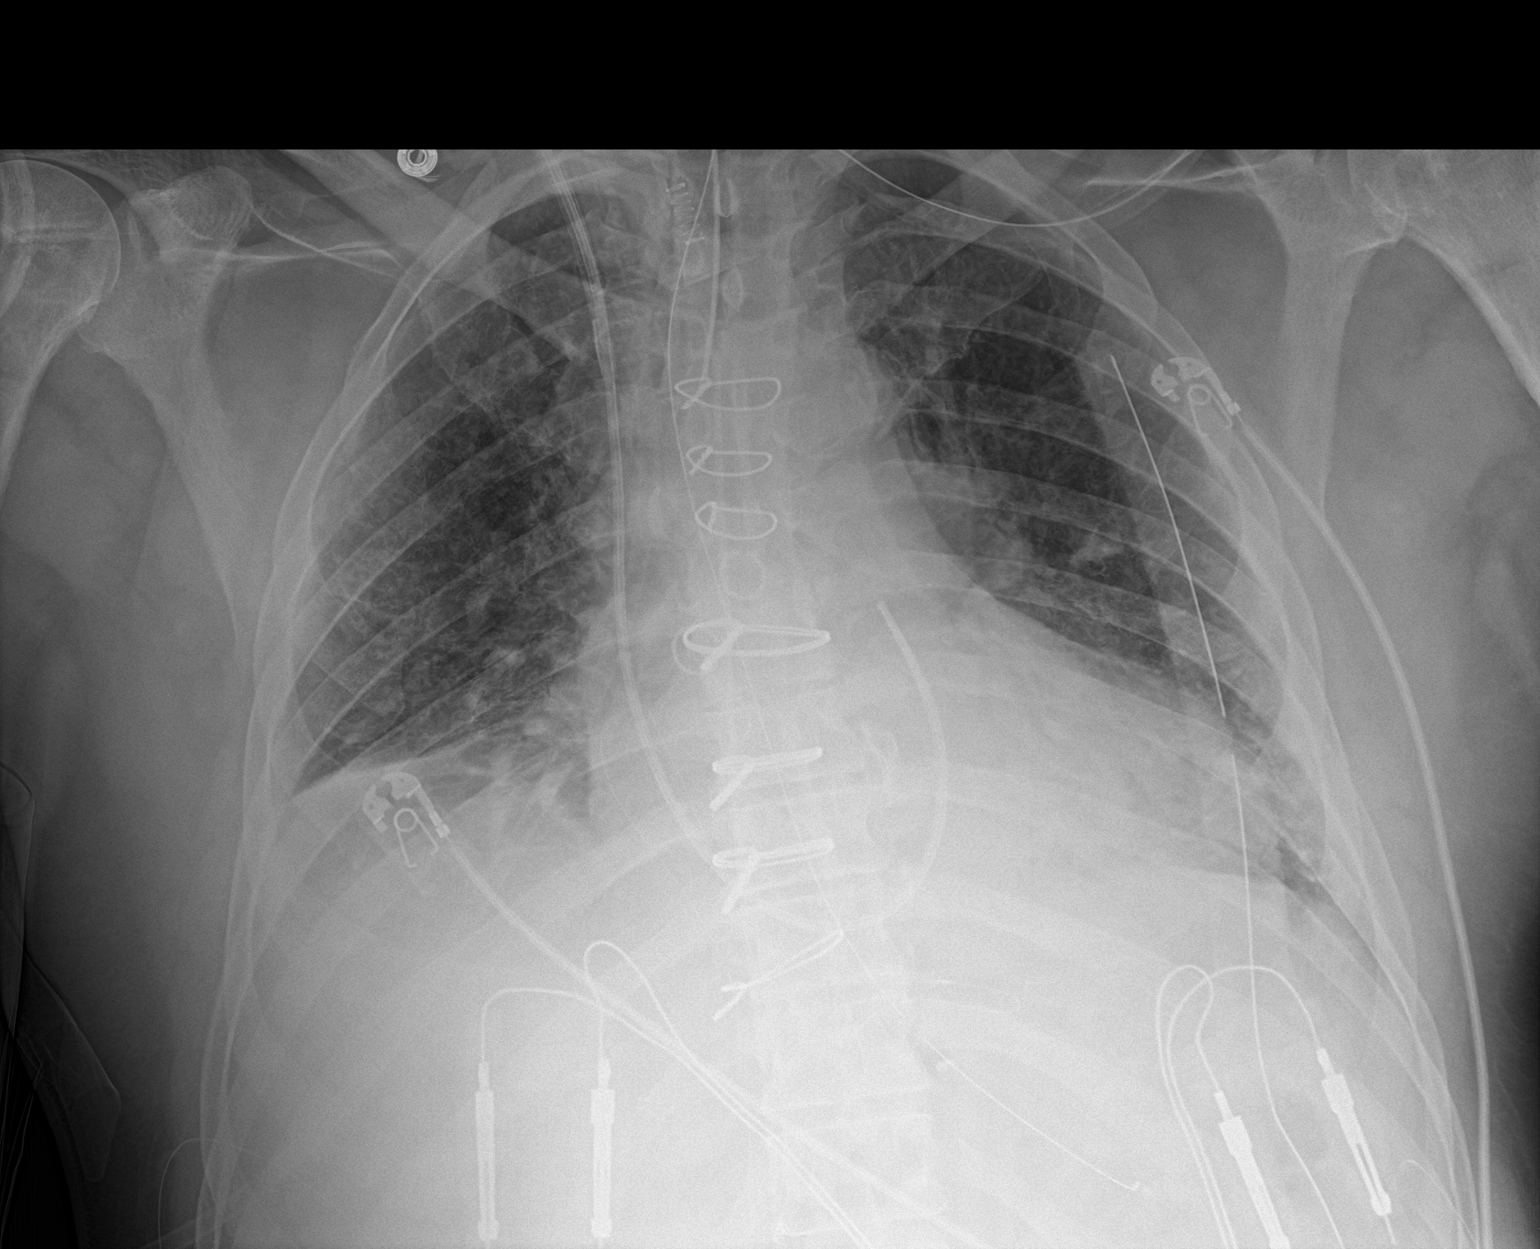

[1 of 1 positions shown; findings below may reference images not displayed]

FINDINGS: Endotracheal tube is in place, tip 4.4 centimeters above the carina.
Nasogastric tube is in place with tip overlying the level of the
stomach. A a RIGHT IJ Swan-Ganz catheter tip overlies the level of
the pulmonary outflow tract. A LEFT-sided chest tube has been
placed.

The heart size is accentuated by the portable technique. There is
bibasilar atelectasis versus consolidation, LEFT greater than RIGHT.
No pneumothorax.
IMPRESSION: 1. Postoperative changes.
2. Bibasilar atelectasis and/or consolidation.
3. No pneumothorax.

## 2020-07-10 IMAGING — DX DG CHEST 1V PORT
1 series · 1 of 1 positions shown · non-contrast
Comparison: Radiograph March 13, 2018.

CLINICAL DATA: Status post coronary bypass graft.

EXAM:
PORTABLE CHEST 1 VIEW

[chest]
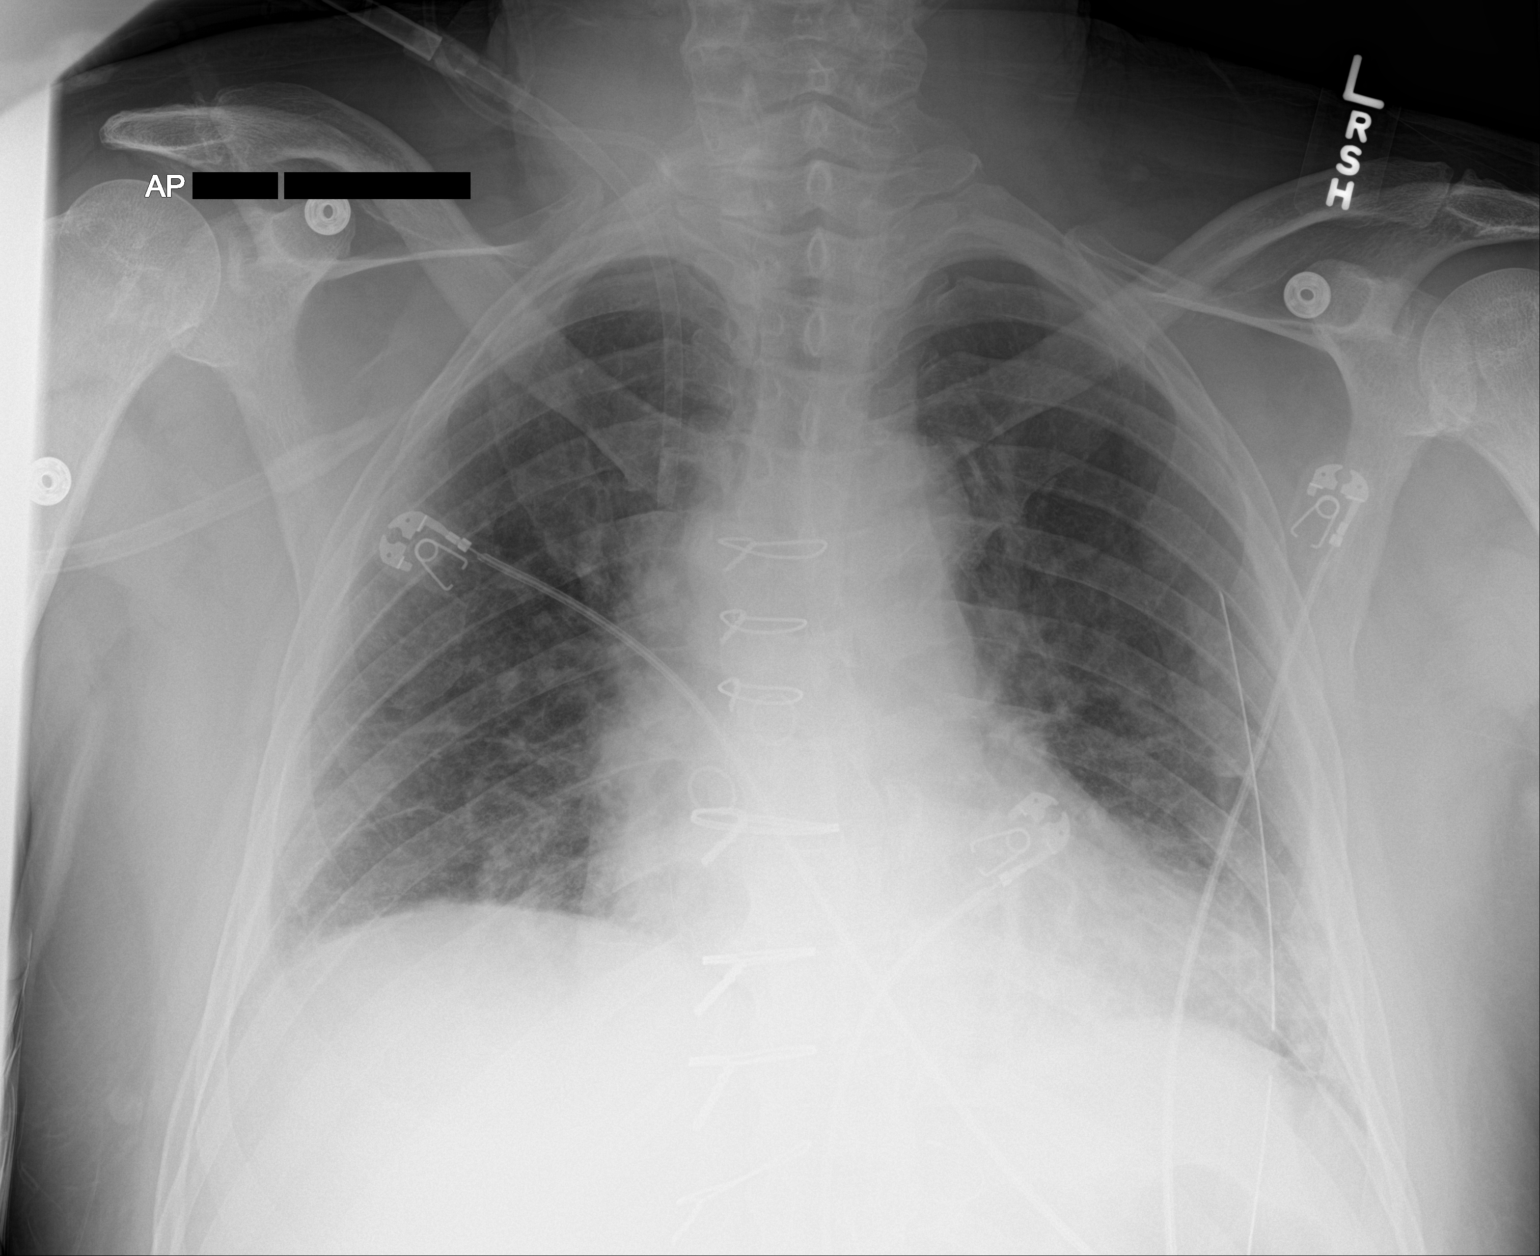

[1 of 1 positions shown; findings below may reference images not displayed]

FINDINGS: Stable cardiomediastinal silhouette. Status post coronary artery
bypass graft. Left-sided chest tube is unchanged in position. No
pneumothorax is noted. Right internal jugular Swan-Ganz catheter has
been removed. Right internal jugular venous sheath remains. Mild
bibasilar subsegmental atelectasis remains. No significant pleural
effusion is noted. Bony thorax is unremarkable.
IMPRESSION: Stable position of left-sided chest tube without pneumothorax.
Stable bibasilar subsegmental atelectasis.

## 2020-12-22 ENCOUNTER — Other Ambulatory Visit: Payer: Self-pay | Admitting: Family Medicine

## 2020-12-22 NOTE — Telephone Encounter (Signed)
Please schedule CPE with fasting labs for Dr. Copland. 

## 2020-12-22 NOTE — Telephone Encounter (Signed)
Spoke with patient stated he will call back to schedule his appointment

## 2020-12-29 NOTE — Telephone Encounter (Signed)
Call patient voice mail is full . Letter sent

## 2020-12-30 ENCOUNTER — Other Ambulatory Visit: Payer: Self-pay | Admitting: Family Medicine

## 2020-12-30 NOTE — Telephone Encounter (Signed)
Call Patient voice mail is full . No message left

## 2020-12-30 NOTE — Telephone Encounter (Signed)
Pharmacy requests refill on: Lisinopril 2.5 mg   LAST REFILL: 10/31/2019 (Q-90, R-3) LAST OV: 10/23/2019 NEXT OV: Not Scheduled  PHARMACY: [id number] [place], [place]

## 2021-01-04 NOTE — Telephone Encounter (Signed)
Call patient no answer . Voice mail is full 

## 2021-01-13 NOTE — Telephone Encounter (Signed)
No answer . Voicemail is full . Letter sent

## 2021-01-14 ENCOUNTER — Other Ambulatory Visit: Payer: Self-pay | Admitting: Cardiology

## 2021-01-14 NOTE — Telephone Encounter (Signed)
Received refill request for Xarelto.  Pt is past due for appt with Dr Wyline Mood and labs to ensure correct Xarelto dosage.    Message sent to [facility] to call pt with appt.  He will need CBC/BMP at that time.  1 refill sent to Pharmacy

## 2021-01-28 ENCOUNTER — Telehealth: Payer: Self-pay | Admitting: Family Medicine

## 2021-01-28 NOTE — Telephone Encounter (Signed)
Please schedule CPE with fasting labs prior with Dr. Copland.  

## 2021-02-02 NOTE — Telephone Encounter (Signed)
Call patient voice mail is full. 

## 2021-02-10 NOTE — Progress Notes (Addendum)
Cardiology Office Note    Date:  02/11/2021   ID:  Ronald Stephens, DOB 03/07/1961, MRN [id number]  PCP:  Hannah Beatopland, Spencer, MD  Cardiologist: Dina RichBranch, Jonathan, MD    Chief Complaint  Patient presents with   Follow-up    Overdue Annual Visit    History of Present Illness:    Ronald Stephens is a 60 y.o. male with past medical history of CAD (s/p CABG in 02/2018 with LIMA-LAD, seq SVG-LCx-RI and SVG-distal RCA), post-operative atrial flutter (following CABG), HTN, HLD and Type 2 DM who presents to the office today for overdue follow-up.   He most recently had a telehealth visit with Dr. Wyline MoodBranch in 01/2019 and denied any recent chest pain or dyspnea on exertion at that time. He was continued on his current medication regimen and it was recommended he wear a cardiac event monitor and if no recurrent atrial flutter, could stop Xarelto and switch to ASA. It does not appear that the monitor was arranged. He was informed to follow-up in 6 months but has not been evaluated by Cardiology since.  In talking with the patient today, he reports overall doing well since his last office visit. He remains active at his job and is also physically active at home as [place] and also [place] a [place] in [place]. He denies any recent chest pain or dyspnea on exertion when doing physical activities. No recent orthopnea, PND or lower extremity edema. No reported palpitations.   He reports good compliance with his current medication regimen and denies any noted side effects. He did quit smoking following CABG in 2019. Says that he also now consumes a more heart healthy diet and tries to limit his sodium intake   Past Medical History:  Diagnosis Date   Arthritis    Bilateral ankle fractures 2014   CAD (coronary artery disease)    a. s/p CABG in 02/2018 with LIMA-LAD, seq SVG-LCx-RI and SVG-distal RCA   Diabetes mellitus    Heart murmur    as child    Hyperlipidemia    Hypertension    Right forearm fracture 2014   Tobacco abuse     Past Surgical History:  Procedure Laterality Date   CARDIAC CATHETERIZATION     as teen   CORONARY ARTERY BYPASS GRAFT N/A 03/12/2018   Procedure: CORONARY ARTERY BYPASS GRAFTING (CABG) x 4, LIMA to LAD, SVG SEQUENTIALLY to DISTAL CIRCUMFLEX and RAMUS INTERMEDIATED, and SVG to DISTAL RCA,  USING LEFT INTERNAL MAMMARY ARTERY AND RIGHT GREATER SAPHENOUS VEIN HARVESTED ENDOSCOPICALLY;  Surgeon: Delight OvensGerhardt, Edward B, MD;  [facility];  Service: Open Heart Surgery;  Laterality: N/A;   LEFT HEART CATH AND CORONARY ANGIOGRAPHY N/A 03/09/2018   Procedure: LEFT HEART CATH AND CORONARY ANGIOGRAPHY;  Surgeon: Lyn RecordsSmith, Henry W, MD;  Location: [facility] INVASIVE CV LAB;  Service: Cardiovascular;  Laterality: N/A;   MEDIASTERNOTOMY  03/12/2018   Procedure: MEDIAN STERNOTOMY;  Surgeon: Delight OvensGerhardt, Edward B, MD;  [facility];  Service: Open Heart Surgery;;   TEE WITHOUT CARDIOVERSION N/A 03/12/2018   Procedure: TRANSESOPHAGEAL ECHOCARDIOGRAM (TEE);  Surgeon: Delight OvensGerhardt, Edward B, MD;  [facility];  Service: Open Heart Surgery;  Laterality: N/A;    Current Medications: Outpatient Medications Prior to Visit  Medication Sig Dispense Refill   atorvastatin (LIPITOR) 80 MG tablet TAKE 1 TABLET BY MOUTH EVERY DAY 90 tablet 0   lisinopril (ZESTRIL) 2.5 MG tablet TAKE 1 TABLET BY MOUTH EVERY DAY 90 tablet  0   metFORMIN (GLUCOPHAGE-XR) 500 MG 24 hr tablet TAKE 2 TABLETS BY MOUTH EVERY DAY WITH BREAKFAST 180 tablet 0   omeprazole (PRILOSEC) 20 MG capsule TAKE ONE CAPSULE BY MOUTH TWICE A DAY 60 capsule 5   XARELTO 20 MG TABS tablet TAKE 1 TABLET BY MOUTH EVERY DAY WITH SUPPER 90 tablet 0   metoprolol tartrate (LOPRESSOR) 25 MG tablet TAKE 1 TABLET BY MOUTH TWICE A DAY 60 tablet 6   No facility-administered medications prior to visit.     Allergies:   Patient has no known allergies.   Social History   Socioeconomic History   Marital  status: Married    Spouse name: Not on file   Number of children: Not on file   Years of education: Not on file   Highest education level: Not on file  Occupational History   Occupation: supervisor  Tobacco Use   Smoking status: Former    Packs/day: 0.50    Years: 30.00    Pack years: 15.00    Types: Cigarettes   Smokeless tobacco: Current  Vaping Use   Vaping Use: Never used  Substance and Sexual Activity   Alcohol use: Not Currently    Comment: 2-3 beers per day   Drug use: No   Sexual activity: Not on file  Other Topics Concern   Not on file  Social History Narrative   Not on file   Social Determinants of Health   Financial Resource Strain: Not on file  Food Insecurity: Not on file  Transportation Needs: Not on file  Physical Activity: Not on file  Stress: Not on file  Social Connections: Not on file     Family History:  The patient's family history is not on file. He was adopted.   Review of Systems:    Please see the history of present illness.     All other systems reviewed and are otherwise negative except as noted above.   Physical Exam:    VS:  BP 124/80   Pulse 91   Ht 6' (1.829 m)   Wt 199 lb (90.3 kg)   SpO2 95%   BMI 26.99 kg/m    General: Well developed, well nourished,male appearing in no acute distress. Head: Normocephalic, atraumatic. Neck: No carotid bruits. JVD not elevated.  Lungs: Respirations regular and unlabored, without wheezes or rales.  Heart: Regular rate and rhythm. No S3 or S4.  No murmur, no rubs, or gallops appreciated. Abdomen: Appears non-distended. No obvious abdominal masses. Msk:  Strength and tone appear normal for age. No obvious joint deformities or effusions. Extremities: No clubbing or cyanosis. No pitting edema.  Distal pedal pulses are 2+ bilaterally. Neuro: Alert and oriented X 3. Moves all extremities spontaneously. No focal deficits noted. Psych:  Responds to questions appropriately with a normal  affect. Skin: No rashes or lesions noted  Wt Readings from Last 3 Encounters:  02/11/21 199 lb (90.3 kg)  10/23/19 203 lb (92.1 kg)  05/02/19 203 lb 6 oz (92.3 kg)     Studies/Labs Reviewed:   EKG:  EKG is ordered today. The ekg ordered today demonstrates NSR, HR 88 with inferior infarct pattern. No acute ST changes when compared to prior tracings.   Recent Labs: No results found for requested labs within last 8760 hours.   Lipid Panel    Component Value Date/Time   CHOL 102 10/18/2019 0836   TRIG 82.0 10/18/2019 0836   HDL 44.70 10/18/2019 0836   CHOLHDL 2 10/18/2019  0836   VLDL 16.4 10/18/2019 0836   LDLCALC 41 10/18/2019 0836   LDLDIRECT 174.5 06/05/2013 1214    Additional studies/ records that were reviewed today include:   Cardiac Catheterization: 02/2018 Severe three-vessel coronary artery disease.  Moderate three-vessel coronary calcification. Greater than 95% proximal LAD and 70% mid to distal.  First diagonal with multifocal 90% stenosis.  The LAD wraps around the left ventricular apex. Ramus intermedius with proximal and mid 90% stenoses. Circumflex is totally occluded after a large first obtuse marginal.  The obtuse marginal contains 70% small to mid stenosis.  Left to left collaterals to the occluded segment. Dominant right coronary totally occluded proximal to mid segment with right to right and left to right collaterals. Mild inferior hypokinesis.  EF 50 to 55%.  Normal LVEDP   RECOMMENDATIONS:   TCTS consultation to consider multivessel revascularization. Resume IV heparin Aggressive secondary risk factor modification: High intensity statin therapy, glycemic control, start low-dose beta-blocker therapy, aspirin, and should be hospitalized until revascularization can occur.   Recommend Aspirin 81mg  daily for moderate CAD.  Echocardiogram: 02/2018 Study Conclusions   - Left ventricle: The cavity size was normal. Wall thickness was    increased in a  pattern of mild LVH. Systolic function was normal.    The estimated ejection fraction was in the range of 55% to 60%.    Wall motion was normal; there were no regional wall motion    abnormalities. Doppler parameters are consistent with abnormal    left ventricular relaxation (grade 1 diastolic dysfunction).   Impressions:   - Normal LV systolic function; mild diastolic dysfunction; mild    LVH.   Assessment:    1. Coronary artery disease involving native coronary artery of native heart without angina pectoris   2. History of atrial flutter   3. Essential hypertension   4. Hyperlipidemia LDL goal <70   5. Medication management   6. Controlled type 2 diabetes mellitus without complication, without long-term current use of insulin (HCC)      Plan:   In order of problems listed above:  1. CAD - He is s/p CABG in 02/2018 with LIMA-LAD, seq SVG-LCx-RI and SVG-distal RCA. He remains very active at baseline and denies any recent anginal symptoms. - Continue current medication regimen with Atorvastatin 80 mg daily, Lisinopril 2.5 mg daily and Lopressor 25 mg twice daily. He has not been on ASA given the need for anticoagulation but will review with Dr. 03/2018 as outlined below to see if Xarelto can be discontinued. Will recheck routine labs including CBC and BMET.   2. Post-operative Atrial Flutter  - Occurred in 2019 following CABG and was recommended at his last visit in 01/2020 to wear a cardiac monitor and if no recurrence, could stop Xarelto and switch to ASA.  - Given the timeframe since his prior atrial flutter and no known recurrence and no recent palpitations, I will review with Dr. 02/2020 to see if he still wishes for the patient to wear a monitor prior to discontinuation of Xarelto.  If able to stop Xarelto, would plan to restart ASA 81 mg daily.  Addendum: 02/18/2021: Dr. 04/21/2021 is fine with the patient stopping Xarelto and following symptoms clinically given how far out he is  from his prior episode. Would start ASA 81 mg daily once off Xarelto.  3. HTN - His blood pressure is well controlled at 124/80 during today's visit. Continue current medication regimen with Lisinopril 2.5 mg daily and Lopressor 25 mg  twice daily.  4. HLD - He has not had recent labs by his PCP and will plan to order a repeat fasting lipid panel and LFT's. He remains on Atorvastatin 80 mg daily with goal LDL less than 70 in the setting of known CAD.  5. Type 2 DM - Currently on Metformin 1000mg  daily. Will recheck Hgb A1c with upcoming labs and forward a copy of results to his PCP.   Medication Adjustments/Labs and Tests Ordered: Current medicines are reviewed at length with the patient today.  Concerns regarding medicines are outlined above.  Medication changes, Labs and Tests ordered today are listed in the Patient Instructions below. Patient Instructions  Medication Instructions:  Your physician recommends that you continue on your current medications as directed. Please refer to the Current Medication list given to you today.  *If you need a refill on your cardiac medications before your next appointment, please call your pharmacy*   Lab Work: Your physician recommends that you return for lab work in: Fasting   If you have labs (blood work) drawn today and your tests are completely normal, you will receive your results only by: MyChart Message (if you have MyChart) OR A paper copy in the mail If you have any lab test that is abnormal or we need to change your treatment, we will call you to review the results.   Testing/Procedures: NONE    Follow-Up: At [facility], you and your health needs are our priority.  As part of our continuing mission to provide you with exceptional heart care, we have created designated Provider Care Teams.  These Care Teams include your primary Cardiologist (physician) and Advanced Practice Providers (APPs -  Physician Assistants and Nurse  Practitioners) who all work together to provide you with the care you need, when you need it.  We recommend signing up for the patient portal called "MyChart".  Sign up information is provided on this After Visit Summary.  MyChart is used to connect with patients for Virtual Visits (Telemedicine).  Patients are able to view lab/test results, encounter notes, upcoming appointments, etc.  Non-urgent messages can be sent to your provider as well.   To learn more about what you can do with MyChart, go to [facility].    Your next appointment:   1 year(s)  The format for your next appointment:   In Person  Provider:   [email or web address], MD   Other Instructions Thank you for choosing [facility]!     Signed, Dina Rich, PA-C  02/11/2021 5:03 PM    [facility] [address] [place], [place] Phone: [phone] Fax: [phone]

## 2021-02-11 ENCOUNTER — Encounter: Payer: Self-pay | Admitting: Student

## 2021-02-11 ENCOUNTER — Ambulatory Visit: Payer: 59 | Admitting: Student

## 2021-02-11 ENCOUNTER — Other Ambulatory Visit: Payer: Self-pay

## 2021-02-11 VITALS — BP 124/80 | HR 91 | Ht 72.0 in | Wt 199.0 lb

## 2021-02-11 DIAGNOSIS — E785 Hyperlipidemia, unspecified: Secondary | ICD-10-CM | POA: Diagnosis not present

## 2021-02-11 DIAGNOSIS — I2510 Atherosclerotic heart disease of native coronary artery without angina pectoris: Secondary | ICD-10-CM | POA: Diagnosis not present

## 2021-02-11 DIAGNOSIS — Z8679 Personal history of other diseases of the circulatory system: Secondary | ICD-10-CM

## 2021-02-11 DIAGNOSIS — I10 Essential (primary) hypertension: Secondary | ICD-10-CM | POA: Diagnosis not present

## 2021-02-11 DIAGNOSIS — E119 Type 2 diabetes mellitus without complications: Secondary | ICD-10-CM

## 2021-02-11 DIAGNOSIS — Z79899 Other long term (current) drug therapy: Secondary | ICD-10-CM

## 2021-02-11 MED ORDER — METOPROLOL TARTRATE 25 MG PO TABS: 25.0000 mg | ORAL_TABLET | Freq: Two times a day (BID) | ORAL | 3 refills | Status: DC

## 2021-02-11 NOTE — Patient Instructions (Signed)
Medication Instructions:  °Your physician recommends that you continue on your current medications as directed. Please refer to the Current Medication list given to you today. ° °*If you need a refill on your cardiac medications before your next appointment, please call your pharmacy* ° ° °Lab Work: °Your physician recommends that you return for lab work in: Fasting  ° °If you have labs (blood work) drawn today and your tests are completely normal, you will receive your results only by: °MyChart Message (if you have MyChart) OR °A paper copy in the mail °If you have any lab test that is abnormal or we need to change your treatment, we will call you to review the results. ° ° °Testing/Procedures: °NONE  ° ° °Follow-Up: °At [facility], you and your health needs are our priority.  As part of our continuing mission to provide you with exceptional heart care, we have created designated Provider Care Teams.  These Care Teams include your primary Cardiologist (physician) and Advanced Practice Providers (APPs -  Physician Assistants and Nurse Practitioners) who all work together to provide you with the care you need, when you need it. ° °We recommend signing up for the patient portal called "MyChart".  Sign up information is provided on this After Visit Summary.  MyChart is used to connect with patients for Virtual Visits (Telemedicine).  Patients are able to view lab/test results, encounter notes, upcoming appointments, etc.  Non-urgent messages can be sent to your provider as well.   °To learn more about what you can do with MyChart, go to [email or web address].   ° °Your next appointment:   °1 year(s) ° °The format for your next appointment:   °In Person ° °Provider:   °Jonathan Branch, MD  ° ° °Other Instructions °Thank you for choosing [facility]! ° ° ° °

## 2021-02-17 NOTE — Telephone Encounter (Signed)
Spoke with patient stated he cant take any time off work right now will call back when he is able to schedule

## 2021-02-18 ENCOUNTER — Telehealth: Payer: Self-pay | Admitting: Student

## 2021-02-18 NOTE — Telephone Encounter (Signed)
    Please let the patient know that I reviewed with Dr. Wyline Mood and he can stop Xarelto and switch to ASA 81 mg daily. If he wishes to finish his current bottle before switching to ASA, that is fine.  Signed, Ellsworth Lennox, PA-C 02/18/2021, 12:43 PM Pager: [phone]

## 2021-02-19 ENCOUNTER — Other Ambulatory Visit: Payer: Self-pay

## 2021-02-19 ENCOUNTER — Other Ambulatory Visit (HOSPITAL_COMMUNITY)
Admission: RE | Admit: 2021-02-19 | Discharge: 2021-02-19 | Disposition: A | Discharge status: Home / Self Care | Payer: 59 | Source: Ambulatory Visit | Attending: Student | Admitting: Student

## 2021-02-19 DIAGNOSIS — Z79899 Other long term (current) drug therapy: Secondary | ICD-10-CM | POA: Diagnosis present

## 2021-02-19 DIAGNOSIS — E119 Type 2 diabetes mellitus without complications: Secondary | ICD-10-CM | POA: Diagnosis present

## 2021-02-19 DIAGNOSIS — I10 Essential (primary) hypertension: Secondary | ICD-10-CM | POA: Insufficient documentation

## 2021-02-19 DIAGNOSIS — E785 Hyperlipidemia, unspecified: Secondary | ICD-10-CM | POA: Insufficient documentation

## 2021-02-19 LAB — COMPREHENSIVE METABOLIC PANEL
ALT: 59 U/L — ABNORMAL HIGH (ref 0–44)
AST: 68 U/L — ABNORMAL HIGH (ref 15–41)
Albumin: 3.9 g/dL (ref 3.5–5.0)
Alkaline Phosphatase: 88 U/L (ref 38–126)
Anion gap: 10 (ref 5–15)
BUN: 8 mg/dL (ref 6–20)
CO2: 24 mmol/L (ref 22–32)
Calcium: 8.6 mg/dL — ABNORMAL LOW (ref 8.9–10.3)
Chloride: 102 mmol/L (ref 98–111)
Creatinine, Ser: 0.7 mg/dL (ref 0.61–1.24)
GFR, Estimated: >60 mL/min (ref >60)
Glucose, Bld: 171 mg/dL — ABNORMAL HIGH (ref 70–99)
Potassium: 4.1 mmol/L (ref 3.5–5.1)
Sodium: 136 mmol/L (ref 135–145)
Total Bilirubin: 0.9 mg/dL (ref 0.3–1.2)
Total Protein: 7.9 g/dL (ref 6.5–8.1)

## 2021-02-19 LAB — LIPID PANEL
Cholesterol: 108 mg/dL (ref 0–200)
HDL: 50 mg/dL (ref >40)
LDL Cholesterol: 47 mg/dL (ref 0–99)
Total CHOL/HDL Ratio: 2.2 RATIO
Triglycerides: 57 mg/dL (ref <150)
VLDL: 11 mg/dL (ref 0–40)

## 2021-02-19 LAB — HEMOGLOBIN A1C
Hgb A1c MFr Bld: 7.6 % — ABNORMAL HIGH (ref 4.8–5.6)
Mean Plasma Glucose: 171.42 mg/dL

## 2021-02-19 LAB — CBC
HCT: 43 % (ref 39.0–52.0)
Hemoglobin: 14.2 g/dL (ref 13.0–17.0)
MCH: 29.1 pg (ref 26.0–34.0)
MCHC: 33 g/dL (ref 30.0–36.0)
MCV: 88.1 fL (ref 80.0–100.0)
Platelets: 104 10*3/uL — ABNORMAL LOW (ref 150–400)
RBC: 4.88 MIL/uL (ref 4.22–5.81)
RDW: 13.6 % (ref 11.5–15.5)
WBC: 4.6 10*3/uL (ref 4.0–10.5)
nRBC: 0 % (ref 0.0–0.2)

## 2021-02-19 MED ORDER — ASPIRIN EC 81 MG PO TBEC: 81.0000 mg | DELAYED_RELEASE_TABLET | Freq: Every day | ORAL | 3 refills | Status: AC

## 2021-02-19 NOTE — Telephone Encounter (Signed)
Pt notified and agrees with plan of care.  

## 2021-02-23 ENCOUNTER — Telehealth: Payer: Self-pay | Admitting: *Deleted

## 2021-02-23 DIAGNOSIS — E785 Hyperlipidemia, unspecified: Secondary | ICD-10-CM

## 2021-02-23 NOTE — Telephone Encounter (Signed)
-----   Message from Ellsworth Lennox, [place] sent at 02/19/2021  4:57 PM EDT ----- Please let the patient know that his hemoglobin is within a normal range. He does have a chronically low platelet count and this is consistent with prior values. Kidney function and electrolytes remain stable. Liver enzymes are slightly elevated. If he drinks alcohol, would recommend reduction in this and recheck LFT's in 2 months by [place] or his PCP. If he does not drink alcohol, would reduce Atorvastatin to 40mg  daily and still recheck in 2 months. Hgb A1c is at 7.6 which is not at goal of less than 7.0 given his known diabetes. Cholesterol is at goal with LDL at 47 (goal is less than 70). Please forward a copy of results to , MD.

## 2021-02-23 NOTE — Telephone Encounter (Signed)
Pt notified and order placed 

## 2021-03-30 ENCOUNTER — Other Ambulatory Visit: Payer: Self-pay | Admitting: Family Medicine

## 2021-03-30 NOTE — Telephone Encounter (Signed)
Last office visit 10/23/2019.  Last Lipid 02/19/2021 while in hospital.  Office called patient in June 22 to try and get him scheduled for his CPE and patient stated he cant take any time off work right now will call back when he is able to schedule.  No future appointments.  Refill?

## 2021-04-04 ENCOUNTER — Other Ambulatory Visit: Payer: Self-pay | Admitting: Family Medicine

## 2021-04-04 NOTE — Telephone Encounter (Signed)
  Last office visit 10/23/2019.  Office called patient in June 22 to try and get him scheduled for his CPE and patient stated he cant take any time off work right now will call back when he is able to schedule.  No future appointments.  Refill?

## 2021-04-20 ENCOUNTER — Other Ambulatory Visit: Payer: Self-pay | Admitting: Cardiology

## 2021-04-30 ENCOUNTER — Other Ambulatory Visit: Payer: Self-pay | Admitting: Family Medicine

## 2021-04-30 NOTE — Telephone Encounter (Signed)
Last office visit 10/23/2019 for CPE.  Last refilled 01/28/2021 for #160.  Arline Asp spoke with patient on 02/17/2021 to try and schedule CPE and stated he cant take any time off work right now will call back when he is able to schedule.  No future appointments.  Refill?

## 2021-05-09 ENCOUNTER — Other Ambulatory Visit: Payer: Self-pay | Admitting: Family Medicine

## 2021-05-09 NOTE — Telephone Encounter (Signed)
Last office visit 10/23/2019 for CPE.  Last refilled 04/05/21 for #30 with no refills.   Arline Asp spoke with patient on 02/17/2021 to try and schedule CPE and stated he cant take any time off work right now will call back when he is able to schedule.  No future appointments.  Refill?

## 2021-05-28 ENCOUNTER — Other Ambulatory Visit: Payer: Self-pay | Admitting: Family Medicine

## 2021-06-22 ENCOUNTER — Telehealth: Payer: Self-pay | Admitting: Family Medicine

## 2021-06-22 ENCOUNTER — Other Ambulatory Visit: Payer: Self-pay | Admitting: Family Medicine

## 2021-06-22 NOTE — Telephone Encounter (Signed)
Could not lvm but I did send a mychart letter for pt to call the office to schedule labs/cpe

## 2021-06-22 NOTE — Telephone Encounter (Signed)
Last office visit 10/23/2019 for CPE.  Last refilled 05/28/21 for #60 with no refills.   Arline Asp spoke with patient on 02/17/2021 to try and schedule CPE and stated he cant take any time off work right now will call back when he is able to schedule.  No future appointments.  Refill?

## 2021-06-22 NOTE — Telephone Encounter (Signed)
I sent him in #15 days worth of medication.  I can't really medically manage him without seeing him.  I have not seen him in 20 months.  This is dangerous is many ways.   He needs to work it out to see me.    I do work part-time, so he can always change to a different doctor closer to his home in [place] if that is better for him or someone in our office who has more availability.

## 2021-06-22 NOTE — Telephone Encounter (Signed)
This was attached to this note in error.

## 2021-06-22 NOTE — Telephone Encounter (Signed)
Please attempt to schedule patient a CPE or Diabetic follow up with Dr. Patsy Lager.  He can't continue to refill his medication without an appointment. It's not good medical care.

## 2021-06-23 NOTE — Telephone Encounter (Signed)
2nd attempt  Unable to lm, line is busy

## 2021-07-06 ENCOUNTER — Other Ambulatory Visit: Payer: Self-pay | Admitting: Family Medicine

## 2022-01-19 ENCOUNTER — Encounter: Payer: Self-pay | Admitting: Family Medicine

## 2022-01-19 ENCOUNTER — Ambulatory Visit (INDEPENDENT_AMBULATORY_CARE_PROVIDER_SITE_OTHER): Payer: 59 | Admitting: Family Medicine

## 2022-01-19 VITALS — BP 140/82 | HR 72 | Temp 98.4°F | Ht 69.75 in | Wt 206.1 lb

## 2022-01-19 DIAGNOSIS — Z79899 Other long term (current) drug therapy: Secondary | ICD-10-CM

## 2022-01-19 DIAGNOSIS — L853 Xerosis cutis: Secondary | ICD-10-CM | POA: Diagnosis not present

## 2022-01-19 DIAGNOSIS — E785 Hyperlipidemia, unspecified: Secondary | ICD-10-CM | POA: Diagnosis not present

## 2022-01-19 DIAGNOSIS — Z951 Presence of aortocoronary bypass graft: Secondary | ICD-10-CM

## 2022-01-19 DIAGNOSIS — E1169 Type 2 diabetes mellitus with other specified complication: Secondary | ICD-10-CM

## 2022-01-19 DIAGNOSIS — I10 Essential (primary) hypertension: Secondary | ICD-10-CM

## 2022-01-19 DIAGNOSIS — Z125 Encounter for screening for malignant neoplasm of prostate: Secondary | ICD-10-CM

## 2022-01-19 DIAGNOSIS — Z0000 Encounter for general adult medical examination without abnormal findings: Secondary | ICD-10-CM | POA: Diagnosis not present

## 2022-01-19 DIAGNOSIS — E119 Type 2 diabetes mellitus without complications: Secondary | ICD-10-CM | POA: Diagnosis not present

## 2022-01-19 DIAGNOSIS — Z1211 Encounter for screening for malignant neoplasm of colon: Secondary | ICD-10-CM

## 2022-01-19 DIAGNOSIS — Z23 Encounter for immunization: Secondary | ICD-10-CM | POA: Diagnosis not present

## 2022-01-19 DIAGNOSIS — R5383 Other fatigue: Secondary | ICD-10-CM

## 2022-01-19 DIAGNOSIS — R7989 Other specified abnormal findings of blood chemistry: Secondary | ICD-10-CM

## 2022-01-19 LAB — CBC WITH DIFFERENTIAL/PLATELET
Basophils Absolute: 0 10*3/uL (ref 0.0–0.1)
Basophils Relative: 0.4 % (ref 0.0–3.0)
Eosinophils Absolute: 0 10*3/uL (ref 0.0–0.7)
Eosinophils Relative: 1 % (ref 0.0–5.0)
HCT: 43.2 % (ref 39.0–52.0)
Hemoglobin: 14 g/dL (ref 13.0–17.0)
Lymphocytes Relative: 26.2 % (ref 12.0–46.0)
Lymphs Abs: 0.9 10*3/uL (ref 0.7–4.0)
MCHC: 32.4 g/dL (ref 30.0–36.0)
MCV: 80.6 fl (ref 78.0–100.0)
Monocytes Absolute: 0.3 10*3/uL (ref 0.1–1.0)
Monocytes Relative: 8.3 % (ref 3.0–12.0)
Neutro Abs: 2.2 10*3/uL (ref 1.4–7.7)
Neutrophils Relative %: 64.1 % (ref 43.0–77.0)
Platelets: 98 10*3/uL — ABNORMAL LOW (ref 150.0–400.0)
RBC: 5.36 Mil/uL (ref 4.22–5.81)
RDW: 15.4 % (ref 11.5–15.5)
WBC: 3.5 10*3/uL — ABNORMAL LOW (ref 4.0–10.5)

## 2022-01-19 LAB — LIPID PANEL
Cholesterol: 262 mg/dL — ABNORMAL HIGH (ref 0–200)
HDL: 36.9 mg/dL — ABNORMAL LOW (ref >39.00)
Total CHOL/HDL Ratio: 7
Triglycerides: 619 mg/dL — ABNORMAL HIGH (ref 0.0–149.0)

## 2022-01-19 LAB — BASIC METABOLIC PANEL
BUN: 13 mg/dL (ref 6–23)
CO2: 25 mEq/L (ref 19–32)
Calcium: 9 mg/dL (ref 8.4–10.5)
Chloride: 95 mEq/L — ABNORMAL LOW (ref 96–112)
Creatinine, Ser: 0.69 mg/dL (ref 0.40–1.50)
GFR: 99.71 mL/min (ref >60.00)
Glucose, Bld: 301 mg/dL — ABNORMAL HIGH (ref 70–99)
Potassium: 4.1 mEq/L (ref 3.5–5.1)
Sodium: 131 mEq/L — ABNORMAL LOW (ref 135–145)

## 2022-01-19 LAB — HEPATIC FUNCTION PANEL
ALT: 65 U/L — ABNORMAL HIGH (ref 0–53)
AST: 72 U/L — ABNORMAL HIGH (ref 0–37)
Albumin: 3.8 g/dL (ref 3.5–5.2)
Alkaline Phosphatase: 124 U/L — ABNORMAL HIGH (ref 39–117)
Bilirubin, Direct: 0.2 mg/dL (ref 0.0–0.3)
Total Bilirubin: 0.6 mg/dL (ref 0.2–1.2)
Total Protein: 7.7 g/dL (ref 6.0–8.3)

## 2022-01-19 LAB — MICROALBUMIN / CREATININE URINE RATIO
Creatinine,U: 55.2 mg/dL
Microalb Creat Ratio: 27.4 mg/g (ref 0.0–30.0)
Microalb, Ur: 15.1 mg/dL — ABNORMAL HIGH (ref 0.0–1.9)

## 2022-01-19 LAB — LDL CHOLESTEROL, DIRECT: Direct LDL: 172 mg/dL

## 2022-01-19 LAB — HEMOGLOBIN A1C: Hgb A1c MFr Bld: 12.4 % — ABNORMAL HIGH (ref 4.6–6.5)

## 2022-01-19 LAB — TSH: TSH: 2.55 u[IU]/mL (ref 0.35–5.50)

## 2022-01-19 MED ORDER — METOPROLOL TARTRATE 25 MG PO TABS: 25.0000 mg | ORAL_TABLET | Freq: Two times a day (BID) | ORAL | 3 refills | Status: DC

## 2022-01-19 MED ORDER — LISINOPRIL 2.5 MG PO TABS: 2.5000 mg | ORAL_TABLET | Freq: Every day | ORAL | 3 refills | Status: DC

## 2022-01-19 MED ORDER — OZEMPIC (0.25 OR 0.5 MG/DOSE) 2 MG/3ML ~~LOC~~ SOPN: 0.2500 mg | PEN_INJECTOR | SUBCUTANEOUS | 3 refills | Status: AC

## 2022-01-19 MED ORDER — ATORVASTATIN CALCIUM 80 MG PO TABS: 80.0000 mg | ORAL_TABLET | Freq: Every day | ORAL | 3 refills | Status: DC

## 2022-01-19 NOTE — Patient Instructions (Signed)
Over the counter Patanol (or generic) - for eyes

## 2022-01-19 NOTE — Progress Notes (Signed)
Ronald Kenyon T. Meiah Zamudio, MD, CAQ Sports Medicine [facility] at [facility] [address] [place], [address]  Phone: [phone]  FAX: [phone]  Ronald Stephens - 61 y.o. male  MRN [id number]  Date of Birth: [birth year]  Date: 01/19/2022  PCP: Ronald Beat, MD  Referral: Ronald Beat, MD  Chief Complaint  Patient presents with   Annual Exam   Patient Care Team: Ronald Beat, MD as PCP - General Branch, Dorothe Pea, MD as PCP - Cardiology (Cardiology) Jena Gauss Gerrit Friends, MD as Consulting Physician (Gastroenterology) Subjective:   Ronald Stephens is a 80 y.o. pleasant patient who presents with the following:  Preventative Health Maintenance Visit:  Rhet has not been into the MD office in more than a year.  He has CAD, s/p CABG, DM, COPD, HTN, Lipids.  Health Maintenance Summary Reviewed and updated, unless pt declines services.  Tobacco History Reviewed.  No smoking now.  Alcohol: about 2 or more at night Exercise Habits: work STD concerns: no risk or activity to increase risk Drug Use: None  Labs including all DM labs [facility] Colon scr Shingrix Tdap  Diabetes Mellitus: Tolerating Medications: yes Compliance with diet: fair, Body mass index is 29.79 kg/m. Exercise: minimal / intermittent Avg blood sugars at home: not checking Foot problems: none Hypoglycemia: none No nausea, vomitting, blurred vision, polyuria.  Lab Results  Component Value Date   HGBA1C 12.4 Repeated and verified X2. (H) 01/19/2022   HGBA1C 7.6 (H) 02/19/2021   HGBA1C 6.9 (H) 10/18/2019   Lab Results  Component Value Date   MICROALBUR 15.1 (H) 01/19/2022   LDLCALC 47 02/19/2021   CREATININE 0.69 01/19/2022    Wt Readings from Last 3 Encounters:  01/19/22 206 lb 2 oz (93.5 kg)  02/11/21 199 lb (90.3 kg)  10/23/19 203 lb (92.1 kg)     Hurt his L arm, - fell and landed - partial cuff injury Over the last few weeks, he  has been having trouble using and moving his whole L arm, but it is now slowly improving.   - has been out for several months - all meds  Will obtain colon screening  Health Maintenance  Topic Date Due   FOOT EXAM  Never done   OPHTHALMOLOGY EXAM  Never done   Hepatitis C Screening  Never done   COLONOSCOPY (Pts 45-43yrs Insurance coverage will need to be confirmed)  Never done   COVID-19 Vaccine (3 - Moderna risk series) 05/06/2020   INFLUENZA VACCINE  03/15/2022   Zoster Vaccines- Shingrix (2 of 2) 03/16/2022   HEMOGLOBIN A1C  07/21/2022   TETANUS/TDAP  01/20/2032   HIV Screening  Completed   HPV VACCINES  Aged Out   Immunization History  Administered Date(s) Administered   Influenza Split 04/27/2011   Influenza,inj,Quad PF,6+ Mos 05/02/2019   Moderna Sars-Covid-2 Vaccination 03/11/2020, 04/08/2020   Pneumococcal Polysaccharide-23 10/18/2010   Td 10/18/2010   Tdap 01/19/2022   Zoster Recombinat (Shingrix) 01/19/2022   Patient Active Problem List   Diagnosis Date Noted   COPD, severe (HCC) 03/12/2018    Priority: High   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    Priority: High   NSTEMI (non-ST elevated myocardial infarction) (HCC) 03/09/2018    Priority: High   Controlled type 2 diabetes mellitus without complication, without long-term current use of insulin (HCC) 04/22/2009    Priority: High   Hyperlipidemia associated with type 2 diabetes mellitus (HCC) 04/10/2009    Priority:  High   Essential hypertension 03/14/2009    Priority: High   TOBACCO USE 03/13/2009    Priority: Medium    Coronary artery disease 03/12/2018   OSTEOARTHRITIS 03/14/2009    Past Medical History:  Diagnosis Date   Bilateral ankle fractures 2014   CAD (coronary artery disease)    a. s/p CABG in 02/2018 with LIMA-LAD, seq SVG-LCx-RI and SVG-distal RCA   COPD, severe (HCC) 03/12/2018   pft's 03/09/2018 FEV1 .98 24 % predicted   Interpretation: The FVC, FEV1, FEV1/FVC ratio and FEF25-75% are reduced  indicating airway obstruction. The FVC is reduced relative to the SVC indicating air trapping. The slow vital capacity is reduced. Following administration of bronchodilators, there is marginal response. 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   LIMA to LAD SVG SEQUENTIALLY to DISTAL CIRCUMFLEX and RAMUS INTERMEDIATE SVG to DISTAL RCA   Tobacco abuse     Past Surgical History:  Procedure Laterality Date   CARDIAC CATHETERIZATION     as teen   CORONARY ARTERY BYPASS GRAFT N/A 03/12/2018   Procedure: CORONARY ARTERY BYPASS GRAFTING (CABG) x 4, LIMA to LAD, SVG SEQUENTIALLY to DISTAL CIRCUMFLEX and RAMUS INTERMEDIATED, and SVG to DISTAL RCA,  USING LEFT INTERNAL MAMMARY ARTERY AND RIGHT GREATER SAPHENOUS VEIN HARVESTED ENDOSCOPICALLY;  Surgeon: Delight OvensGerhardt, Edward B, MD;  [facility];  Service: Open Heart Surgery;  Laterality: N/A;   LEFT HEART CATH AND CORONARY ANGIOGRAPHY N/A 03/09/2018   Procedure: LEFT HEART CATH AND CORONARY ANGIOGRAPHY;  Surgeon: Lyn RecordsSmith, Henry W, MD;  Location: [facility] INVASIVE CV LAB;  Service: Cardiovascular;  Laterality: N/A;   MEDIASTERNOTOMY  03/12/2018   Procedure: MEDIAN STERNOTOMY;  Surgeon: Delight OvensGerhardt, Edward B, MD;  [facility];  Service: Open Heart Surgery;;   TEE WITHOUT CARDIOVERSION N/A 03/12/2018   Procedure: TRANSESOPHAGEAL ECHOCARDIOGRAM (TEE);  Surgeon: Delight OvensGerhardt, Edward B, MD;  [facility];  Service: Open Heart Surgery;  Laterality: N/A;    Family History  Adopted: Yes  Problem Relation Age of Onset   Colon cancer Neg Hx     Social History   Social History Narrative   Not on file    Past Medical History, Surgical History, Social History, Family History, Problem List, Medications, and Allergies have been reviewed and updated if relevant.  Review of Systems: Pertinent  positives are listed above.  Otherwise, a full 14 point review of systems has been done in full and it is negative except where it is noted positive.  Objective:   BP 140/82   Pulse 72   Temp 98.4 F (36.9 C) (Oral)   Ht 5' 9.75" (1.772 m)   Wt 206 lb 2 oz (93.5 kg)   SpO2 95%   BMI 29.79 kg/m  Ideal Body Weight: Weight in (lb) to have BMI = 25: 172.6  Ideal Body Weight: Weight in (lb) to have BMI = 25: 172.6 No results found.    01/19/2022    9:11 AM 10/23/2019    8:47 AM 02/28/2018    2:01 PM  Depression screen PHQ 2/9  Decreased Interest 0 0 0  Down, Depressed, Hopeless 0 0 0  PHQ - 2 Score 0 0 0     GEN: well developed, well nourished, no acute distress Eyes: conjunctiva and lids normal, PERRLA, EOMI  ENT: TM clear, nares clear, oral exam WNL Neck: supple, no lymphadenopathy, no thyromegaly, no JVD Pulm: clear to auscultation and percussion, respiratory effort normal CV: regular rate and rhythm, S1-S2, no murmur, rub or gallop, no bruits, peripheral pulses normal and symmetric, no cyanosis, clubbing, edema or varicosities GI: soft, non-tender; no hepatosplenomegaly, masses; active bowel sounds all quadrants GU: deferred Lymph: no cervical, axillary or inguinal adenopathy MSK: L arm without humeral tenderness, no clavicle tenderness.  Mild AC joint pain Str 4/5 in abd 5/5 EROM and IROM Painful arc of motion Notable pain in abd with close to a full pos drop test.  SKIN: clear, good turgor, color WNL, no rashes, lesions, or ulcerations Neuro: normal mental status, normal strength, sensation, and motion Psych: alert; oriented to person, place and time, normally interactive and not anxious or depressed in appearance.  All labs reviewed with patient. Results for orders placed or performed in visit on 01/19/22  Hemoglobin A1c  Result Value Ref Range   Hgb A1c MFr Bld 12.4 Repeated and verified X2. (H) 4.6 - 6.5 %  Lipid panel  Result Value Ref Range   Cholesterol 262  (H) 0 - 200 mg/dL   Triglycerides (H) 0.0 - 149.0 mg/dL    818.2 Triglyceride is over 400; calculations on Lipids are invalid.   HDL 36.90 (L) >39.00 mg/dL   Total CHOL/HDL Ratio 7   Basic metabolic panel  Result Value Ref Range   Sodium 131 (L) 135 - 145 mEq/L   Potassium 4.1 3.5 - 5.1 mEq/L   Chloride 95 (L) 96 - 112 mEq/L   CO2 25 19 - 32 mEq/L   Glucose, Bld 301 (H) 70 - 99 mg/dL   BUN 13 6 - 23 mg/dL   Creatinine, Ser 9.93 0.40 - 1.50 mg/dL   GFR 71.69 >67.89 mL/min   Calcium 9.0 8.4 - 10.5 mg/dL  CBC with Differential/Platelet  Result Value Ref Range   WBC 3.5 (L) 4.0 - 10.5 K/uL   RBC 5.36 4.22 - 5.81 Mil/uL   Hemoglobin 14.0 13.0 - 17.0 g/dL   HCT 38.1 01.7 - 51.0 %   MCV 80.6 78.0 - 100.0 fl   MCHC 32.4 30.0 - 36.0 g/dL   RDW 25.8 52.7 - 78.2 %   Platelets 98.0 (L) 150.0 - 400.0 K/uL   Neutrophils Relative % 64.1 43.0 - 77.0 %   Lymphocytes Relative 26.2 12.0 - 46.0 %   Monocytes Relative 8.3 3.0 - 12.0 %   Eosinophils Relative 1.0 0.0 - 5.0 %   Basophils Relative 0.4 0.0 - 3.0 %   Neutro Abs 2.2 1.4 - 7.7 K/uL   Lymphs Abs 0.9 0.7 - 4.0 K/uL   Monocytes Absolute 0.3 0.1 - 1.0 K/uL   Eosinophils Absolute 0.0 0.0 - 0.7 K/uL   Basophils Absolute 0.0 0.0 - 0.1 K/uL  Hepatic function panel  Result Value Ref Range   Total Bilirubin 0.6 0.2 - 1.2 mg/dL   Bilirubin, Direct 0.2 0.0 - 0.3 mg/dL   Alkaline Phosphatase 124 (H) 39 - 117 U/L   AST 72 (H) 0 - 37 U/L   ALT 65 (H) 0 - 53 U/L   Total Protein 7.7 6.0 - 8.3 g/dL   Albumin 3.8 3.5 - 5.2 g/dL  PSA, Total with Reflex to PSA, Free  Result Value Ref Range   PSA, Total 0.2 < OR = 4.0 ng/mL  Microalbumin / creatinine urine ratio  Result Value Ref Range   Microalb, Ur 15.1 (H)  0.0 - 1.9 mg/dL   Creatinine,U 78.2 mg/dL   Microalb Creat Ratio 27.4 0.0 - 30.0 mg/g  TSH  Result Value Ref Range   TSH 2.55 0.35 - 5.50 uIU/mL  LDL cholesterol, direct  Result Value Ref Range   Direct LDL 172.0 mg/dL    Assessment  and Plan:     ICD-10-CM   1. Healthcare maintenance  Z00.00 Tdap vaccine greater than or equal to 7yo IM    Varicella-zoster vaccine IM (Shingrix)    2. Controlled type 2 diabetes mellitus without complication, without long-term current use of insulin (HCC)  E11.9 Semaglutide,0.25 or 0.5MG /DOS, (OZEMPIC, 0.25 OR 0.5 MG/DOSE,) 2 MG/3ML SOPN    Hemoglobin A1c    Microalbumin / creatinine urine ratio    3. Colon cancer screening  Z12.11 Cologuard    4. Hyperlipidemia associated with type 2 diabetes mellitus (HCC)  E11.69 Lipid panel   E78.5 LDL cholesterol, direct    5. Screening PSA (prostate specific antigen)  Z12.5 PSA, Total with Reflex to PSA, Free    6. Encounter for long-term (current) use of medications  Z79.899 Basic metabolic panel    CBC with Differential/Platelet    Hepatic function panel    7. Dry skin  L85.3 TSH    8. Other fatigue  R53.83 TSH    9. Need for Tdap vaccination  Z23 Tdap vaccine greater than or equal to 7yo IM    10. Need for shingles vaccine  Z23 Varicella-zoster vaccine IM (Shingrix)    11. Elevated LFTs  R79.89     12. S/P CABG x 4  Z95.1     13. Essential hypertension  I10      Update as much health maintenance as possible All labs Tdap, Shingrix Cologuard scr  DM is very out of control - off of medication.  Will need to be aggressively increase med usage.  Was only on low-dose metformin before, and he wanted to try ozempic.  Regardless with DM, CAD, multiple of problems, this will need to be much more aggressively treated. Start Ozempic  Notably increased LFT's.  He did use to drink more, some now, so I am going to have to do a liver work-up as well.  Resume all meds, we will need to contact him directly about all significant lab abnormalities.  Will need more close follow-up.  Health Maintenance Exam: The patient's preventative maintenance and recommended screening tests for an annual wellness exam were reviewed in full today. Brought  up to date unless services declined.  Counselled on the importance of diet, exercise, and its role in overall health and mortality. The patient's FH and SH was reviewed, including their home life, tobacco status, and drug and alcohol status.  Follow-up in 1 year for physical exam or additional follow-up below.  Follow-up: No follow-ups on file. Or follow-up in 1 year if not noted.  Meds ordered this encounter  Medications   lisinopril (ZESTRIL) 2.5 MG tablet    Sig: Take 1 tablet (2.5 mg total) by mouth daily.    Dispense:  90 tablet    Refill:  3   atorvastatin (LIPITOR) 80 MG tablet    Sig: Take 1 tablet (80 mg total) by mouth daily.    Dispense:  90 tablet    Refill:  3   metoprolol tartrate (LOPRESSOR) 25 MG tablet    Sig: Take 1 tablet (25 mg total) by mouth 2 (two) times daily.    Dispense:  180 tablet  Refill:  3   Semaglutide,0.25 or 0.5MG /DOS, (OZEMPIC, 0.25 OR 0.5 MG/DOSE,) 2 MG/3ML SOPN    Sig: Inject 0.25 mg into the skin once a week.    Dispense:  3 mL    Refill:  3   Medications Discontinued During This Encounter  Medication Reason   metFORMIN (GLUCOPHAGE-XR) 500 MG 24 hr tablet    metoprolol tartrate (LOPRESSOR) 25 MG tablet Reorder   atorvastatin (LIPITOR) 80 MG tablet Reorder   lisinopril (ZESTRIL) 2.5 MG tablet Reorder   Orders Placed This Encounter  Procedures   Tdap vaccine greater than or equal to 7yo IM   Varicella-zoster vaccine IM (Shingrix)   Cologuard   Hemoglobin A1c   Lipid panel   Basic metabolic panel   CBC with Differential/Platelet   Hepatic function panel   PSA, Total with Reflex to PSA, Free   Microalbumin / creatinine urine ratio   TSH   LDL cholesterol, direct    Signed,  Diquan Kassis T. Hershey Knauer, MD   Allergies as of 01/19/2022   No Known Allergies      Medication List        Accurate as of January 19, 2022 11:59 PM. If you have any questions, ask your nurse or doctor.          STOP taking these medications     metFORMIN 500 MG 24 hr tablet Commonly known as: GLUCOPHAGE-XR Stopped by: Ronald Beat, MD       TAKE these medications    aspirin EC 81 MG tablet Take 1 tablet (81 mg total) by mouth daily. Swallow whole.   atorvastatin 80 MG tablet Commonly known as: LIPITOR Take 1 tablet (80 mg total) by mouth daily.   lisinopril 2.5 MG tablet Commonly known as: ZESTRIL Take 1 tablet (2.5 mg total) by mouth daily.   metoprolol tartrate 25 MG tablet Commonly known as: LOPRESSOR Take 1 tablet (25 mg total) by mouth 2 (two) times daily.   omeprazole 20 MG capsule Commonly known as: PRILOSEC TAKE ONE CAPSULE BY MOUTH TWICE A DAY   Ozempic (0.25 or 0.5 MG/DOSE) 2 MG/3ML Sopn Generic drug: Semaglutide(0.25 or 0.5MG /DOS) Inject 0.25 mg into the skin once a week. Started by: Ronald Beat, MD

## 2022-01-21 LAB — PSA, TOTAL WITH REFLEX TO PSA, FREE: PSA, Total: 0.2 ng/mL (ref <=4.0)

## 2022-01-23 ENCOUNTER — Encounter: Payer: Self-pay | Admitting: Family Medicine

## 2022-01-24 ENCOUNTER — Telehealth: Payer: Self-pay | Admitting: *Deleted

## 2022-01-24 NOTE — Telephone Encounter (Signed)
Received fax from [facility] requesting PA for Ozempic.  PA completed on CoverMyMeds and sent for review.  Can take up to 72 hours for a decision.

## 2022-01-25 NOTE — Telephone Encounter (Signed)
Prior auth for Ozempic (0.25 or 0.5 MG/DOSE) 2MG /3ML pen-injectors has been approved. ([employer]) Rx #: [id number] [employer] [facility], on behalf of [employer], is responsible for reviewing pharmacy services provided to [facility] members. [employer] received a request on 01/24/2022 from your prescriber for coverage of Ozempic Inj 2mg /52ml. Your request for Ozempic Inj 2mg /39ml has been approved. How long does this approval last? OZEMPIC INJ 2MG /3ML, use as directed, is approved through 01/25/2023 or until coverage for the medication is no longer available under your benefit plan or the medication becomes subject to a pharmacy benefit coverage requirement, such as supply limits or notification, whichever occurs first as allowed by law. Please note: Doses/quantities above plan limits and/or maximum Food and Drug Administration (FDA) approved dosing may be subject to further review. Reviewed by: System

## 2022-01-27 NOTE — Progress Notes (Signed)
Can you try to contact the patient or his wife?  We may have to try a few times, since I cannot get him on the phone.  Hopefully we can.  If in a week or so, we cannot reach anyone, then I will have to send a certified letter.

## 2022-01-31 ENCOUNTER — Other Ambulatory Visit: Payer: Self-pay | Admitting: Family Medicine

## 2022-01-31 MED ORDER — METFORMIN HCL ER 500 MG PO TB24: ORAL_TABLET | ORAL | 0 refills | Status: DC

## 2022-01-31 MED ORDER — METFORMIN HCL ER 500 MG PO TB24: 2000.0000 mg | ORAL_TABLET | Freq: Every day | ORAL | 5 refills | Status: DC

## 2022-01-31 NOTE — Addendum Note (Signed)
Addended by: Hannah Beat on: 01/31/2022 08:28 AM   Modules accepted: Orders

## 2022-01-31 NOTE — Telephone Encounter (Signed)
No, he needs a titration up - I already sent it as a titration.

## 2022-01-31 NOTE — Telephone Encounter (Signed)
Pharmacy is requesting 90 day supply.

## 2022-02-19 ENCOUNTER — Other Ambulatory Visit: Payer: Self-pay | Admitting: Student

## 2022-02-25 ENCOUNTER — Other Ambulatory Visit: Payer: Self-pay | Admitting: Family Medicine

## 2022-02-25 NOTE — Telephone Encounter (Signed)
error 

## 2022-03-06 ENCOUNTER — Other Ambulatory Visit: Payer: Self-pay | Admitting: Cardiology

## 2022-11-04 ENCOUNTER — Other Ambulatory Visit: Payer: Self-pay | Admitting: Family Medicine

## 2023-01-05 ENCOUNTER — Telehealth: Payer: Self-pay

## 2023-01-05 NOTE — Telephone Encounter (Signed)
PA initiated via Covermymeds;KEY: [id number]. Awaiting determination.

## 2023-01-05 NOTE — Telephone Encounter (Signed)
PA approved.

## 2023-01-05 NOTE — Telephone Encounter (Signed)
Noted  

## 2023-01-08 ENCOUNTER — Other Ambulatory Visit: Payer: Self-pay | Admitting: Family Medicine

## 2023-01-10 NOTE — Telephone Encounter (Signed)
Please call and schedule CPE with fasting labs prior with Dr. Patsy Lager after 01/20/2023.

## 2023-01-10 NOTE — Telephone Encounter (Signed)
Called pt, couldn't leave vm. Sent mychart message

## 2023-03-10 ENCOUNTER — Other Ambulatory Visit: Payer: Self-pay | Admitting: Family Medicine

## 2023-03-10 NOTE — Telephone Encounter (Signed)
Lvmtcb, sent mychart message  

## 2023-03-10 NOTE — Telephone Encounter (Signed)
Please try and call patient again to schedule CPE with fasting labs prior with Dr. Patsy Lager.  He never read the MyChart message about scheduling his CPE.

## 2023-06-03 ENCOUNTER — Other Ambulatory Visit: Payer: Self-pay | Admitting: Family Medicine

## 2023-06-05 ENCOUNTER — Other Ambulatory Visit: Payer: Self-pay | Admitting: Family Medicine

## 2023-06-05 NOTE — Telephone Encounter (Signed)
Last office visit 01/19/2022 for CPE.  Last refilled 03/10/2023 for #90 with no refills.  Front office has called and left voicemail's and sent MyChart message for patient to call and schedule CPE with no return call.  Refill or Deny?

## 2023-06-15 ENCOUNTER — Other Ambulatory Visit: Payer: Self-pay | Admitting: Family Medicine

## 2023-07-24 ENCOUNTER — Other Ambulatory Visit: Payer: Self-pay | Admitting: Family Medicine

## 2023-12-07 ENCOUNTER — Telehealth: Payer: Self-pay

## 2023-12-07 NOTE — Telephone Encounter (Signed)
 Pharmacy Patient Advocate Encounter   Received notification from CoverMyMeds that prior authorization for Ozempic  (0.25 or 0.5 MG/DOSE) 2MG /3ML pen-injectors is required/requested.   Insurance verification completed.   The patient is insured through [facility] .   Per test claim: PA required; PA submitted to above mentioned insurance via CoverMyMeds Key/confirmation #/EOC [id number] Status is pending

## 2023-12-08 NOTE — Telephone Encounter (Signed)
 Pharmacy Patient Advocate Encounter  Received notification from [employer] that Prior Authorization for Ozempic  (0.25 or 0.5 MG/DOSE) 2MG /3ML pen-injectors has been APPROVED from 12/07/2023 to 12/06/2024   PA #/Case ID/Reference #: [id number]

## 2023-12-08 NOTE — Telephone Encounter (Signed)
 PCP is now Lavonia Powers at [facility].
